# Patient Record
Sex: Male | Born: 1970 | Race: White | Hispanic: No | Marital: Married | Smoking: Never smoker
Health system: Southern US, Community
[De-identification: ages and names within clinical notes are randomized; demographics above are authoritative.]

## PROBLEM LIST (undated history)

## (undated) DIAGNOSIS — R040 Epistaxis: Secondary | ICD-10-CM

## (undated) DIAGNOSIS — R0600 Dyspnea, unspecified: Secondary | ICD-10-CM

## (undated) DIAGNOSIS — S0990XA Unspecified injury of head, initial encounter: Secondary | ICD-10-CM

## (undated) DIAGNOSIS — K219 Gastro-esophageal reflux disease without esophagitis: Secondary | ICD-10-CM

## (undated) DIAGNOSIS — M25559 Pain in unspecified hip: Secondary | ICD-10-CM

## (undated) DIAGNOSIS — K921 Melena: Secondary | ICD-10-CM

## (undated) DIAGNOSIS — M439 Deforming dorsopathy, unspecified: Secondary | ICD-10-CM

## (undated) DIAGNOSIS — M25579 Pain in unspecified ankle and joints of unspecified foot: Secondary | ICD-10-CM

## (undated) DIAGNOSIS — G43909 Migraine, unspecified, not intractable, without status migrainosus: Secondary | ICD-10-CM

## (undated) DIAGNOSIS — R079 Chest pain, unspecified: Secondary | ICD-10-CM

## (undated) DIAGNOSIS — F419 Anxiety disorder, unspecified: Secondary | ICD-10-CM

## (undated) DIAGNOSIS — K209 Esophagitis, unspecified without bleeding: Secondary | ICD-10-CM

## (undated) DIAGNOSIS — I10 Essential (primary) hypertension: Secondary | ICD-10-CM

## (undated) DIAGNOSIS — N200 Calculus of kidney: Secondary | ICD-10-CM

## (undated) DIAGNOSIS — R55 Syncope and collapse: Secondary | ICD-10-CM

## (undated) DIAGNOSIS — K649 Unspecified hemorrhoids: Secondary | ICD-10-CM

## (undated) DIAGNOSIS — G8929 Other chronic pain: Secondary | ICD-10-CM

## (undated) DIAGNOSIS — R413 Other amnesia: Secondary | ICD-10-CM

## (undated) HISTORY — DX: Unspecified injury of head, initial encounter: S09.90XA

## (undated) HISTORY — DX: Melena: K92.1

## (undated) HISTORY — DX: Migraine, unspecified, not intractable, without status migrainosus: G43.909

## (undated) HISTORY — PX: KNEE SURGERY: SHX244

## (undated) HISTORY — DX: Unspecified hemorrhoids: K64.9

## (undated) HISTORY — DX: Essential (primary) hypertension: I10

## (undated) HISTORY — DX: Calculus of kidney: N20.0

## (undated) HISTORY — DX: Other amnesia: R41.3

## (undated) HISTORY — DX: Gastro-esophageal reflux disease without esophagitis: K21.9

## (undated) HISTORY — DX: Chest pain, unspecified: R07.9

## (undated) HISTORY — DX: Epistaxis: R04.0

## (undated) HISTORY — PX: CARDIAC CATHETERIZATION: SHX172

---

## 1997-03-01 HISTORY — PX: OTHER SURGICAL HISTORY: SHX169

## 2015-10-24 DIAGNOSIS — R079 Chest pain, unspecified: Secondary | ICD-10-CM | POA: Insufficient documentation

## 2015-10-25 DIAGNOSIS — I10 Essential (primary) hypertension: Secondary | ICD-10-CM | POA: Insufficient documentation

## 2015-10-25 DIAGNOSIS — D72829 Elevated white blood cell count, unspecified: Secondary | ICD-10-CM | POA: Insufficient documentation

## 2015-10-25 DIAGNOSIS — Z6841 Body Mass Index (BMI) 40.0 and over, adult: Secondary | ICD-10-CM

## 2016-04-29 DIAGNOSIS — S0990XA Unspecified injury of head, initial encounter: Secondary | ICD-10-CM

## 2016-04-29 HISTORY — DX: Unspecified injury of head, initial encounter: S09.90XA

## 2016-06-10 DIAGNOSIS — Z7185 Encounter for immunization safety counseling: Secondary | ICD-10-CM | POA: Insufficient documentation

## 2016-06-10 DIAGNOSIS — Z7189 Other specified counseling: Secondary | ICD-10-CM | POA: Insufficient documentation

## 2016-06-25 ENCOUNTER — Inpatient Hospital Stay: Payer: BLUE CROSS/BLUE SHIELD | Attending: Oncology | Admitting: Oncology

## 2016-06-25 ENCOUNTER — Encounter (INDEPENDENT_AMBULATORY_CARE_PROVIDER_SITE_OTHER): Payer: Self-pay

## 2016-06-25 ENCOUNTER — Inpatient Hospital Stay: Payer: BLUE CROSS/BLUE SHIELD

## 2016-06-25 ENCOUNTER — Encounter: Payer: Self-pay | Admitting: Oncology

## 2016-06-25 VITALS — BP 159/91 | HR 65 | Temp 96.5°F | Resp 18 | Ht 70.47 in | Wt 273.0 lb

## 2016-06-25 DIAGNOSIS — D72821 Monocytosis (symptomatic): Secondary | ICD-10-CM | POA: Insufficient documentation

## 2016-06-25 DIAGNOSIS — Z6841 Body Mass Index (BMI) 40.0 and over, adult: Secondary | ICD-10-CM | POA: Diagnosis not present

## 2016-06-25 DIAGNOSIS — Z79899 Other long term (current) drug therapy: Secondary | ICD-10-CM | POA: Insufficient documentation

## 2016-06-25 DIAGNOSIS — I10 Essential (primary) hypertension: Secondary | ICD-10-CM | POA: Insufficient documentation

## 2016-06-25 DIAGNOSIS — D72829 Elevated white blood cell count, unspecified: Secondary | ICD-10-CM

## 2016-06-25 DIAGNOSIS — D729 Disorder of white blood cells, unspecified: Secondary | ICD-10-CM

## 2016-06-25 DIAGNOSIS — K921 Melena: Secondary | ICD-10-CM

## 2016-06-25 DIAGNOSIS — K625 Hemorrhage of anus and rectum: Secondary | ICD-10-CM | POA: Insufficient documentation

## 2016-06-25 DIAGNOSIS — D72820 Lymphocytosis (symptomatic): Secondary | ICD-10-CM | POA: Insufficient documentation

## 2016-06-25 DIAGNOSIS — K219 Gastro-esophageal reflux disease without esophagitis: Secondary | ICD-10-CM | POA: Diagnosis not present

## 2016-06-25 LAB — CBC WITH DIFFERENTIAL/PLATELET
Basophils Absolute: 0.1 10*3/uL (ref 0–0.1)
Basophils Relative: 1 %
Eosinophils Absolute: 0.5 10*3/uL (ref 0–0.7)
Eosinophils Relative: 3 %
HEMATOCRIT: 43.7 % (ref 40.0–52.0)
HEMOGLOBIN: 15.2 g/dL (ref 13.0–18.0)
LYMPHS ABS: 4.8 10*3/uL — AB (ref 1.0–3.6)
LYMPHS PCT: 32 %
MCH: 27.9 pg (ref 26.0–34.0)
MCHC: 34.8 g/dL (ref 32.0–36.0)
MCV: 80 fL (ref 80.0–100.0)
MONOS PCT: 5 %
Monocytes Absolute: 0.8 10*3/uL (ref 0.2–1.0)
NEUTROS PCT: 59 %
Neutro Abs: 8.9 10*3/uL — ABNORMAL HIGH (ref 1.4–6.5)
Platelets: 358 10*3/uL (ref 150–440)
RBC: 5.47 MIL/uL (ref 4.40–5.90)
RDW: 14.2 % (ref 11.5–14.5)
WBC: 15.1 10*3/uL — AB (ref 3.8–10.6)

## 2016-06-25 LAB — PATHOLOGIST SMEAR REVIEW

## 2016-06-25 NOTE — Progress Notes (Signed)
Here as new pt eval

## 2016-06-25 NOTE — Progress Notes (Signed)
Hematology/Oncology Consult note [facility] [phone] [phone]  Patient Care Team: Dion Body, MD as PCP - General (Family Medicine)   Name of the patient: Manuel Cain  [id number]  1970/06/26    Reason for referral- leucocytosis   Referring physician- Dr. Netty Starring  Date of visit: 06/25/16   History of presenting illness- Patient is a 46 year old male was then referred to [place] for leukocytosis. Recent CBC from 06/10/2016 showed white count of 15.1, H&H of 14.7/42.4 and a platelet count of 367. Differential on the WBC showed immature granulocytes along with neutrophilia and lymphocytosis. Off note patient was noted to have leukocytosis between 15-18 in August 2017 as well. Differential showed neutrophilia and lymphocytosis as well as monocytosis  Patient reports chest wall pain intermittently that sometimes persists for days. He has seen cardiology and is undergoing testing. Also reports seeing bright red bloos in his stool for about 1-2 weeks now. He has never had egd or colonoscopy. No frequent use of NSAIDS. No family h/o colon cancer  ECOG PS- 0  Pain scale- 4   Review of systems- Review of Systems  Constitutional: Negative for chills, fever, malaise/fatigue and weight loss.  HENT: Negative for congestion, ear discharge and nosebleeds.   Eyes: Negative for blurred vision.  Respiratory: Negative for cough, hemoptysis, sputum production, shortness of breath and wheezing.   Cardiovascular: Positive for chest pain. Negative for palpitations, orthopnea and claudication.  Gastrointestinal: Negative for abdominal pain, blood in stool, constipation, diarrhea, heartburn, melena, nausea and vomiting.  Genitourinary: Negative for dysuria, flank pain, frequency, hematuria and urgency.  Musculoskeletal: Negative for back pain, joint pain and myalgias.  Skin: Negative for rash.  Neurological: Negative for dizziness, tingling, focal  weakness, seizures, weakness and headaches.  Endo/Heme/Allergies: Does not bruise/bleed easily.  Psychiatric/Behavioral: Negative for depression and suicidal ideas. The patient does not have insomnia.     Allergies  Allergen Reactions  . Valium [Diazepam]     Patient Active Problem List   Diagnosis Date Noted  . Hypertension, essential 10/25/2015  . Morbid obesity with BMI of 40.0-44.9, adult ([place]) 10/25/2015  . Leukocytosis 10/25/2015  . Chest pain, unspecified 10/24/2015     Past Medical History:  Diagnosis Date  . Blood in stool    dark pebble like per pt  x4 wks  . Chest pain    on going per pt-x 77mo . Frequent nosebleeds    x 2 mo per pt (march 2018)  . GERD (gastroesophageal reflux disease)   . Head injury 04/2016      . Hemorrhoids   . Hypertension   . Memory changes    x 1 yr-since 2017  . Migraine    since 2013     Past Surgical History:  Procedure Laterality Date  . KNEE SURGERY Left   . vastectomy  1999    Social History   Social History  . Marital status: Married    Spouse name: N/A  . Number of children: N/A  . Years of education: N/A   Occupational History  . Not on file.   Social History Main Topics  . Smoking status: Never Smoker  . Smokeless tobacco: Never Used  . Alcohol use No  . Drug use: No  . Sexual activity: Yes   Other Topics Concern  . Not on file   Social History Narrative  . No narrative on file     Family History  Problem Relation Age of Onset  . Hypertension Mother   .  Breast cancer Mother   . CAD Father   . Heart attack Father   . Bladder Cancer Brother      Current Outpatient Prescriptions:  .  esomeprazole (NEXIUM) 20 MG capsule, Take 20 mg by mouth daily at 12 noon., Disp: , Rfl:  .  losartan (COZAAR) 50 MG tablet, TAKE 1 TABLET BY MOUTH DAILY *FILL AFTER 3/29, Disp: , Rfl: 4 .  Multiple Vitamin (MULTIVITAMIN WITH MINERALS) TABS tablet, Take 1 tablet by mouth daily., Disp: , Rfl:  .  naproxen sodium  (ANAPROX) 220 MG tablet, Take 220 mg by mouth 2 (two) times daily with a meal., Disp: , Rfl:    Physical exam:  Vitals:   06/25/16 1347  BP: (!) 159/91  Pulse: 65  Resp: 18  Temp: (!) 96.5 F (35.8 C)  Weight: 273 lb (123.8 kg)  Height: 5' 10.47" (1.79 m)   Physical Exam  Constitutional: He is oriented to person, place, and time and well-developed, well-nourished, and in no distress.  HENT:  Head: Normocephalic and atraumatic.  Eyes: EOM are normal. Pupils are equal, round, and reactive to light.  Neck: Normal range of motion.  Cardiovascular: Normal rate, regular rhythm and normal heart sounds.   Pulmonary/Chest: Effort normal and breath sounds normal.  Abdominal: Soft. Bowel sounds are normal.  Neurological: He is alert and oriented to person, place, and time.  Skin: Skin is warm and dry.       Assessment and plan- Patient is a 46 y.o. male who was been referred to [place] for leukocytosis  Patient has leukocytosis mainly neutrophilia and lymphocytosis and monocytosis based on review of his differential in the past. He was also found to have some immature granulocytes on his differential. Hence I would like to rule out myeloproliferative disorder and I will start off with a repeat CBC with differential, pathology review of his smear, peripheral flow cytometry, ESR and BCR able testing. If the results of these tests are negative and if there is persistence of immature granulocytes, I will consider doing bone marrow biopsy down the line. I will see the patient back in 2 weeks' time to discuss the results of his blood work and further management. Immature granulocytes can be seen in reactive conditions such as infection or inflammation. No active signs of infection noted  With regards to BRBPR- H/H stable. Will refer to GI after cardiology evaluation is complete   Thank you for this kind referral and the opportunity to participate in the care of this patient   Visit Diagnosis 1.  Leukocytosis, unspecified type   2. Neutrophilia   3. Blood in stool     Dr. Randa Evens, MD, MPH [facility] at [facility] Pager- [phone] 06/25/2016 2:32 PM

## 2016-06-29 ENCOUNTER — Encounter: Payer: Self-pay | Admitting: Gastroenterology

## 2016-06-30 LAB — BCR-ABL1, CML/ALL, PCR, QUANT

## 2016-07-01 LAB — COMP PANEL: LEUKEMIA/LYMPHOMA

## 2016-07-09 ENCOUNTER — Inpatient Hospital Stay: Payer: BLUE CROSS/BLUE SHIELD | Admitting: Oncology

## 2016-07-20 ENCOUNTER — Inpatient Hospital Stay: Payer: BLUE CROSS/BLUE SHIELD | Admitting: Oncology

## 2016-07-27 ENCOUNTER — Inpatient Hospital Stay: Payer: BLUE CROSS/BLUE SHIELD | Admitting: Oncology

## 2016-08-12 IMAGING — US US ABDOMEN LIMITED
1 series · 14 of 25 positions shown · non-contrast
Comparison: None.

CLINICAL DATA: Leukocytosis.  Hepatitis-C.

EXAM:
ULTRASOUND ABDOMEN LIMITED RIGHT UPPER QUADRANT

[Series 1: us abdomen limited · 0.22mm/px · 14 of 44 slices shown]
[im 1/44]
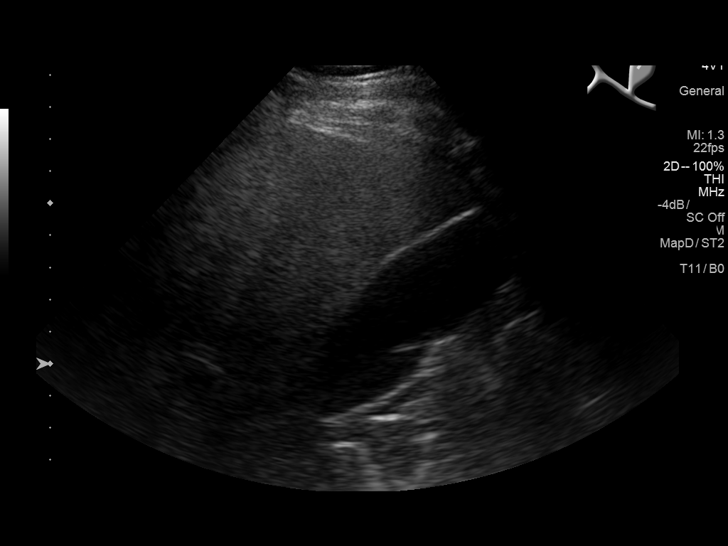
[im 4/44]
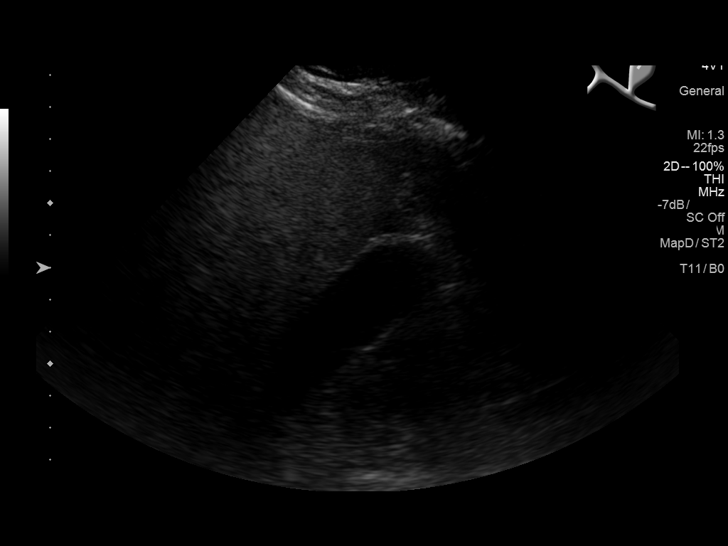
[im 8/44]
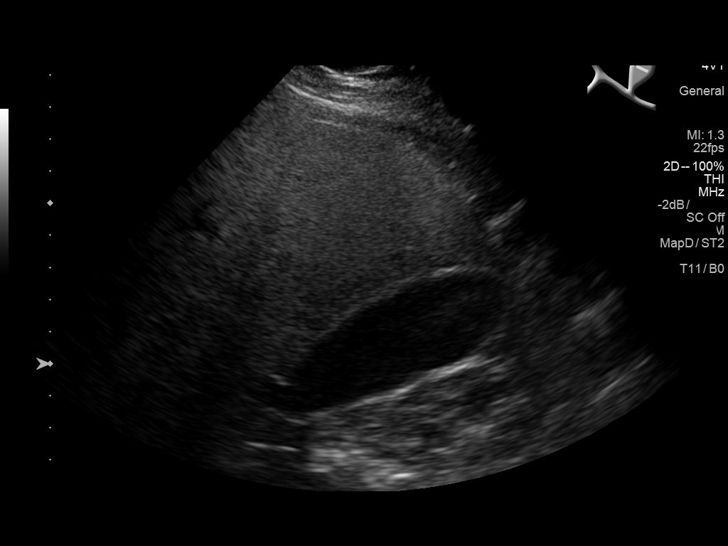
[im 11/44]
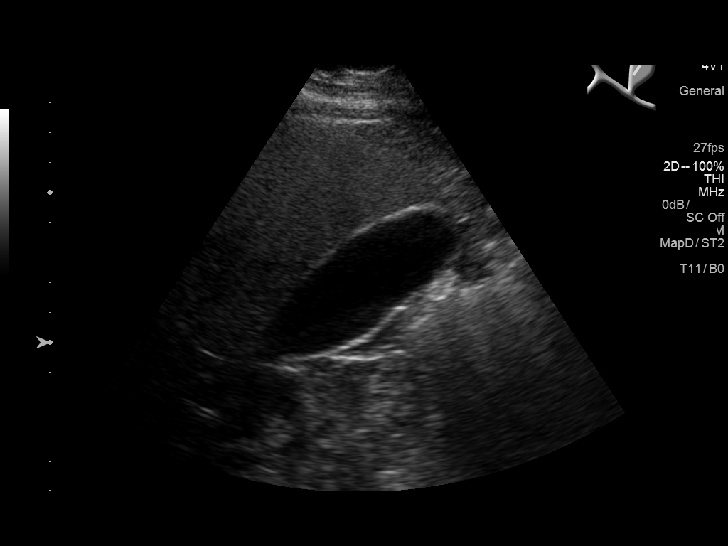
[im 15/44]
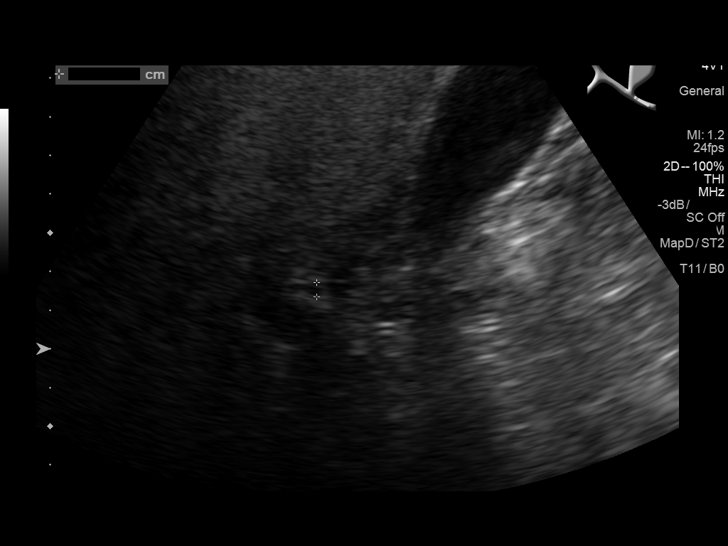
[im 17/44]
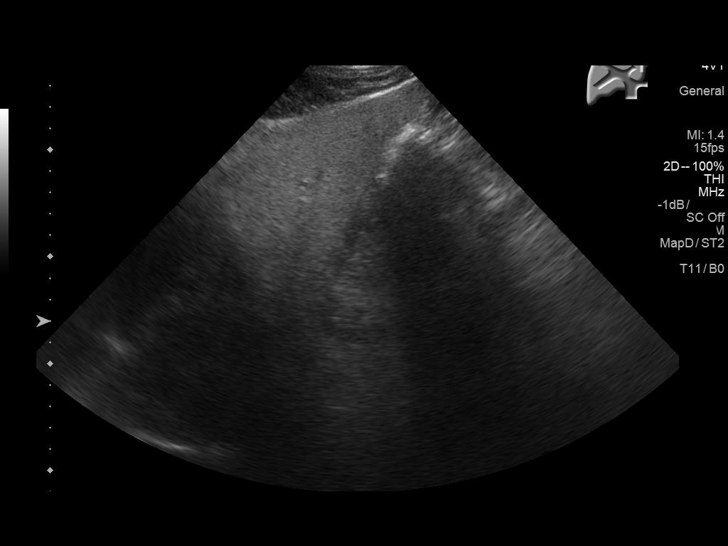
[im 20/44]
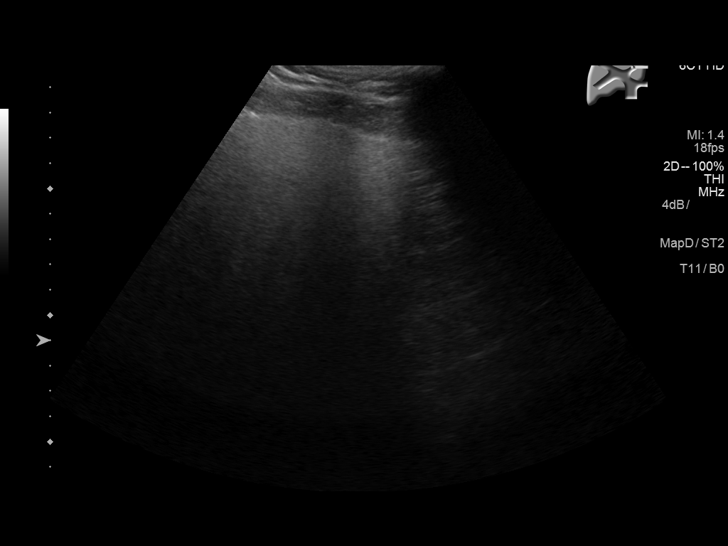
[im 24/44]
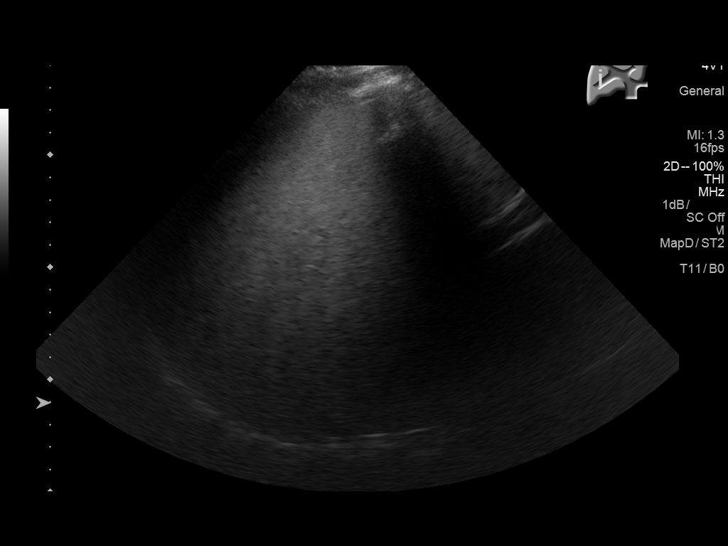
[im 27/44]
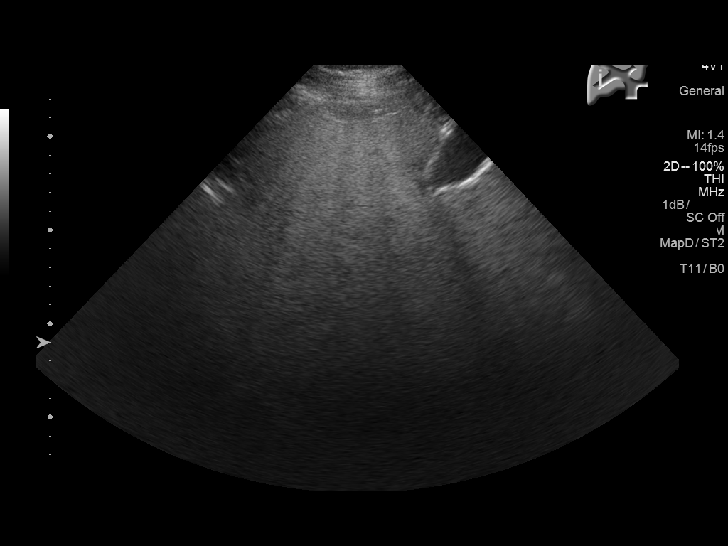
[im 29/44]
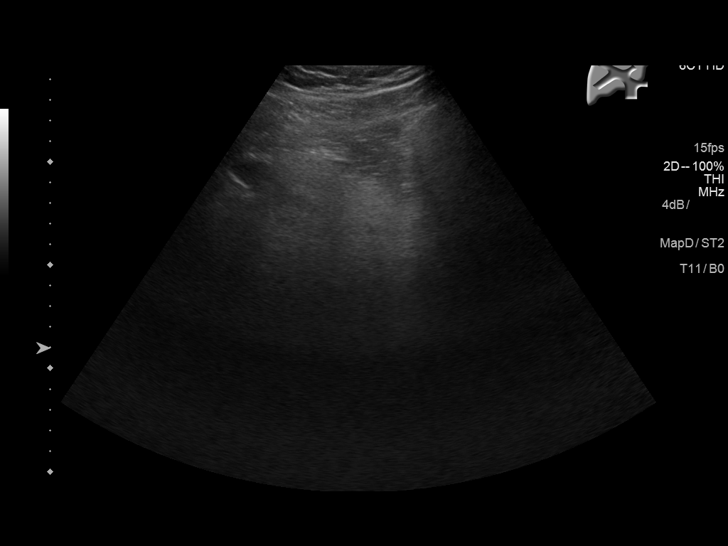
[im 33/44]
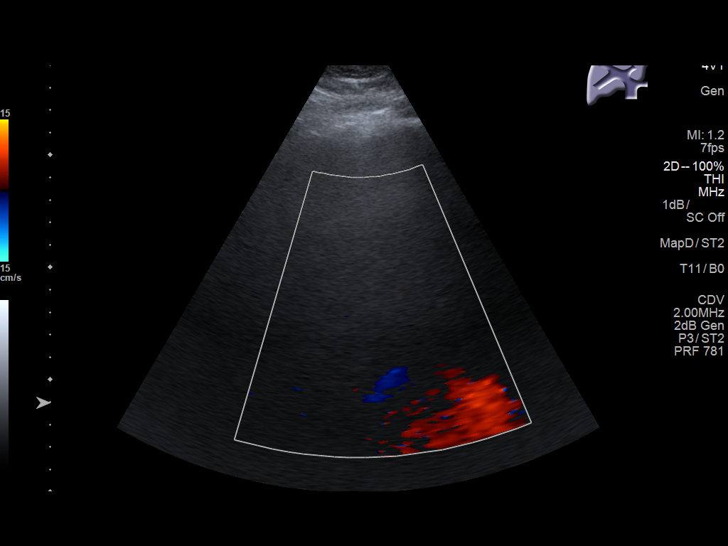
[im 36/44]
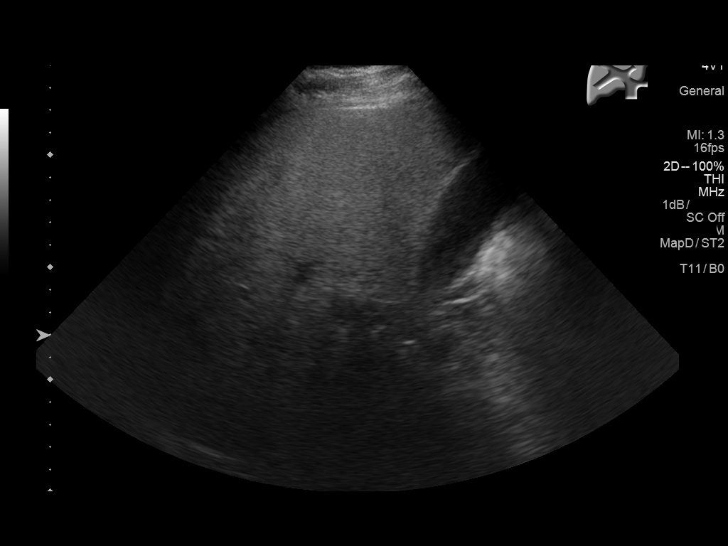
[im 40/44]
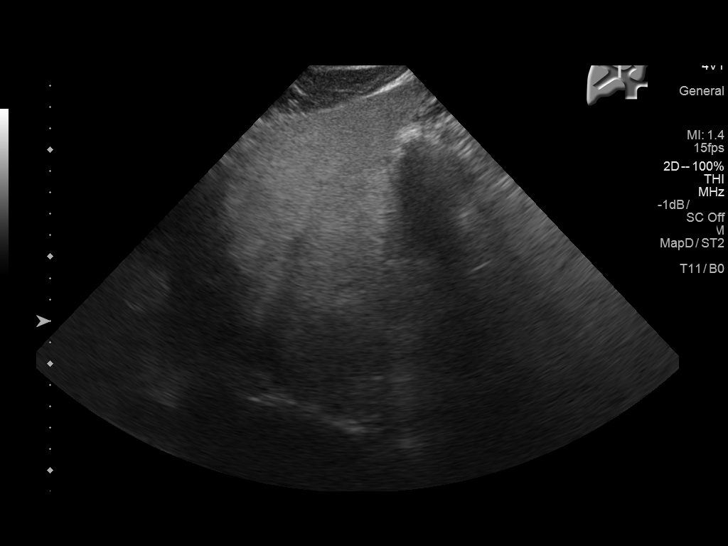
[im 44/44]
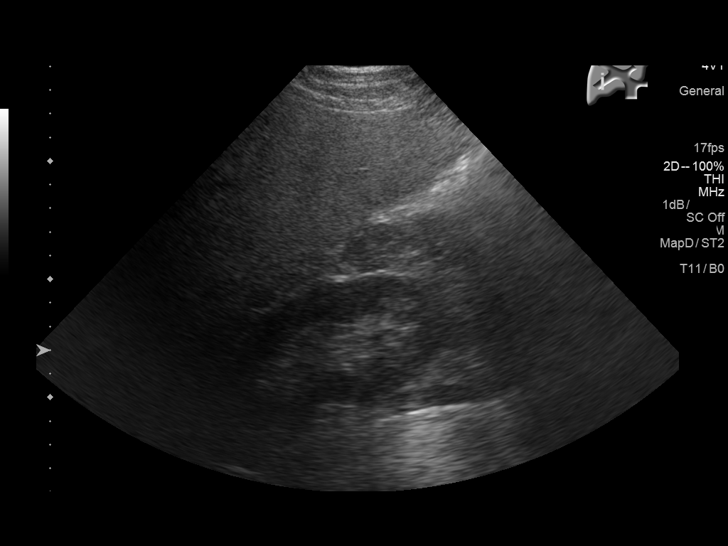

[14 of 25 positions shown; findings below may reference images not displayed]

FINDINGS: Gallbladder:

No gallstones or wall thickening visualized. No sonographic Murphy
sign noted by sonographer.

Common bile duct:

Diameter: 4 mm.

Liver:

Diffusely increased in echogenicity without focal mass. The
parenchyma attenuates the ultrasound beam.
IMPRESSION: Diffuse hepatic steatosis.

Normal gallbladder and biliary tree.

No focal liver mass.

## 2016-08-17 ENCOUNTER — Inpatient Hospital Stay: Payer: BLUE CROSS/BLUE SHIELD | Attending: Oncology | Admitting: Oncology

## 2016-08-17 ENCOUNTER — Encounter: Payer: Self-pay | Admitting: Oncology

## 2016-08-17 VITALS — BP 143/90 | HR 59 | Temp 98.6°F | Ht 70.47 in | Wt 280.0 lb

## 2016-08-17 DIAGNOSIS — Z79899 Other long term (current) drug therapy: Secondary | ICD-10-CM | POA: Insufficient documentation

## 2016-08-17 DIAGNOSIS — I10 Essential (primary) hypertension: Secondary | ICD-10-CM | POA: Insufficient documentation

## 2016-08-17 DIAGNOSIS — K219 Gastro-esophageal reflux disease without esophagitis: Secondary | ICD-10-CM | POA: Diagnosis not present

## 2016-08-17 DIAGNOSIS — K649 Unspecified hemorrhoids: Secondary | ICD-10-CM | POA: Insufficient documentation

## 2016-08-17 DIAGNOSIS — D72829 Elevated white blood cell count, unspecified: Secondary | ICD-10-CM

## 2016-08-17 DIAGNOSIS — K625 Hemorrhage of anus and rectum: Secondary | ICD-10-CM | POA: Diagnosis not present

## 2016-08-17 DIAGNOSIS — R51 Headache: Secondary | ICD-10-CM | POA: Diagnosis not present

## 2016-08-17 DIAGNOSIS — D72820 Lymphocytosis (symptomatic): Secondary | ICD-10-CM | POA: Diagnosis not present

## 2016-08-17 NOTE — Progress Notes (Signed)
Hematology/Oncology Consult note [facility]  [phone] Fax:([phone]  Patient Care Team: Dion Body, MD as PCP - General (Family Medicine)   Name of the patient: Manuel Cain  [id number]  February 13, 1971   Date of visit: 08/17/16  Diagnosis- leucocytosis likely reactive  Chief complaint/ Reason for visit- routine f/u  Heme/Onc history: Patient is a 46 year old male was then referred to [place] for leukocytosis. Recent CBC from 06/10/2016 showed white count of 15.1, H&H of 14.7/42.4 and a platelet count of 367. Differential on the WBC showed immature granulocytes along with neutrophilia and lymphocytosis. Off note patient was noted to have leukocytosis between 15-18 in August 2017 as well. Differential showed neutrophilia and lymphocytosis as well as monocytosis  Patient reports chest wall pain intermittently that sometimes persists for days. He has seen cardiology and is undergoing testing. Also reports seeing bright red bloos in his stool for about 1-2 weeks now. He has never had egd or colonoscopy. No frequent use of NSAIDS. No family h/o colon cancer  Blood work from 06/25/2016 was as follows: CBC showed white count of 15.1, H&H of 15.2/43.7 and a platelet count of 358. Differential on the CBC mainly showed neutrophilia and lymphocytosis. Review of peripheral smear showed leukocytosis with absolute neutrophilia and lymphocytosis. Normal maturation appreciated. No morphologic abnormalities noted. Peripheral flow cytometry showed increasing multiple lymphocyte subsets suggesting a reactive process. No monoclonal B-cell population identified. Testing for BCR able was negative  Interval history- 1. Seen by cardiology for chest pain. Could not get stress test done because he cannot tolerate any drugs IV.   2. Reports shock like sensation that permeates his scalp and lasts for a few sec. He has trouble focusing during these times  3. Problems  with heartburn and BRBPR for which he is seeing Dr. Allen Norris from GI  ECOG PS- 1 Pain scale- 0   Review of systems- Review of Systems  Constitutional: Positive for malaise/fatigue. Negative for chills, fever and weight loss.  HENT: Negative for congestion, ear discharge and nosebleeds.   Eyes: Negative for blurred vision.  Respiratory: Negative for cough, hemoptysis, sputum production, shortness of breath and wheezing.   Cardiovascular: Negative for chest pain, palpitations, orthopnea and claudication.  Gastrointestinal: Positive for blood in stool and heartburn. Negative for abdominal pain, constipation, diarrhea, melena, nausea and vomiting.  Genitourinary: Negative for dysuria, flank pain, frequency, hematuria and urgency.  Musculoskeletal: Negative for back pain, joint pain and myalgias.  Skin: Negative for rash.  Neurological: Positive for headaches. Negative for dizziness, tingling, focal weakness, seizures and weakness.  Endo/Heme/Allergies: Does not bruise/bleed easily.  Psychiatric/Behavioral: Negative for depression and suicidal ideas. The patient does not have insomnia.        Allergies  Allergen Reactions  . Valium [Diazepam]      Past Medical History:  Diagnosis Date  . Blood in stool    dark pebble like per pt  x4 wks  . Chest pain    on going per pt-x 41mo . Frequent nosebleeds    x 2 mo per pt (march 2018)  . GERD (gastroesophageal reflux disease)   . Head injury 04/2016      . Hemorrhoids   . Hypertension   . Memory changes    x 1 yr-since 2017  . Migraine    since 2013     Past Surgical History:  Procedure Laterality Date  . KNEE SURGERY Left   . vastectomy  1999    Social History  Social History  . Marital status: Married    Spouse name: N/A  . Number of children: N/A  . Years of education: N/A   Occupational History  . Not on file.   Social History Main Topics  . Smoking status: Never Smoker  . Smokeless tobacco: Never Used  .  Alcohol use No  . Drug use: No  . Sexual activity: Yes   Other Topics Concern  . Not on file   Social History Narrative  . No narrative on file    Family History  Problem Relation Age of Onset  . Hypertension Mother   . Breast cancer Mother   . CAD Father   . Heart attack Father   . Bladder Cancer Brother      Current Outpatient Prescriptions:  .  esomeprazole (NEXIUM) 20 MG capsule, Take 20 mg by mouth daily at 12 noon., Disp: , Rfl:  .  losartan (COZAAR) 50 MG tablet, TAKE 1 TABLET BY MOUTH DAILY *FILL AFTER 3/29, Disp: , Rfl: 4 .  Multiple Vitamin (MULTIVITAMIN WITH MINERALS) TABS tablet, Take 1 tablet by mouth daily., Disp: , Rfl:  .  naproxen sodium (ANAPROX) 220 MG tablet, Take 220 mg by mouth 2 (two) times daily with a meal., Disp: , Rfl:   Physical exam:  Vitals:   08/17/16 1118  BP: (!) 143/90  Pulse: (!) 59  Temp: 98.6 F (37 C)  TempSrc: Tympanic  Weight: 280 lb (127 kg)  Height: 5' 10.47" (1.79 m)   Physical Exam  Constitutional: He is oriented to person, place, and time.  Obese. In ano acute distress  HENT:  Head: Normocephalic and atraumatic.  Eyes: EOM are normal. Pupils are equal, round, and reactive to light.  Neck: Normal range of motion.  Cardiovascular: Normal rate, regular rhythm and normal heart sounds.   Pulmonary/Chest: Effort normal and breath sounds normal.  Abdominal: Soft. Bowel sounds are normal.  Neurological: He is alert and oriented to person, place, and time.  Skin: Skin is warm and dry.     No flowsheet data found. CBC Latest Ref Rng & Units 06/25/2016  WBC 3.8 - 10.6 K/uL 15.1(H)  Hemoglobin 13.0 - 18.0 g/dL 15.2  Hematocrit 40.0 - 52.0 % 43.7  Platelets 150 - 440 K/uL 358      Assessment and plan- Patient is a 46 y.o. male refererd for leucocytosis mainly neutrophilia and lymphocytosis  Results of bloodwork were discussed with the patient over the phone in the past. There is no evidence of immature cells noted. Flow  cytometry suggestive of reactive process. Bcr abl testing negative. I am inclined to monitor thsi conservatively without bone marrow biopsy at this time. If WBC keeps trending up, it could be considered down the line. No concomitant cytopenias. Repeat cbc with diff in 4 months. I will see him back in 8 months with cbc/diff. His recent cbc 2 weeks ago showed wbc count of 16 again with neutrophilia and lymphocytosis unchanged from prior counts  BRBPR- f/u with GI  Advised to speak to PCP about his headaches and other symptoms as mentioned above   Visit Diagnosis 1. Leukocytosis, unspecified type      Dr. Randa Evens, MD, MPH [facility] at [facility] Pager- [phone] 08/17/2016 1:02 PM

## 2016-08-17 NOTE — Progress Notes (Signed)
Patient c/o bright red to dark blood from rectum after BM. Has an apt with Dr. Allen Norris on Thursday to further discuss his concerns.

## 2016-08-19 ENCOUNTER — Other Ambulatory Visit: Payer: Self-pay

## 2016-08-19 ENCOUNTER — Ambulatory Visit: Payer: BLUE CROSS/BLUE SHIELD | Admitting: Gastroenterology

## 2016-08-19 ENCOUNTER — Ambulatory Visit (INDEPENDENT_AMBULATORY_CARE_PROVIDER_SITE_OTHER): Payer: BLUE CROSS/BLUE SHIELD | Admitting: Gastroenterology

## 2016-08-19 ENCOUNTER — Encounter: Payer: Self-pay | Admitting: Gastroenterology

## 2016-08-19 VITALS — BP 124/69 | HR 85 | Temp 98.8°F | Ht 70.0 in | Wt 278.0 lb

## 2016-08-19 DIAGNOSIS — K219 Gastro-esophageal reflux disease without esophagitis: Secondary | ICD-10-CM | POA: Diagnosis not present

## 2016-08-19 DIAGNOSIS — G8929 Other chronic pain: Secondary | ICD-10-CM

## 2016-08-19 DIAGNOSIS — R197 Diarrhea, unspecified: Secondary | ICD-10-CM

## 2016-08-19 DIAGNOSIS — R1011 Right upper quadrant pain: Secondary | ICD-10-CM

## 2016-08-19 DIAGNOSIS — K921 Melena: Secondary | ICD-10-CM | POA: Diagnosis not present

## 2016-08-19 DIAGNOSIS — D72829 Elevated white blood cell count, unspecified: Secondary | ICD-10-CM

## 2016-08-19 NOTE — Patient Instructions (Signed)
You are scheduled for a RUQ abdominal US at [facility] [place] on Monday, June 25th @ 8:45am. Please arrive at 8:30am. You cannot have anything to eat or drink after midnight Sunday night.  If you need to reschedule this appt for any reason, please contact central scheduling at [phone].

## 2016-08-19 NOTE — Progress Notes (Signed)
Gastroenterology Consultation  Referring Provider:     Sindy Guadeloupe, MD Primary Care Physician:  Dion Body, MD Primary Gastroenterologist:  Dr. Allen Norris     Reason for Consultation:     Rectal bleeding and GERD        HPI:   Manuel Cain is a 46 y.o. y/o male referred for consultation & management of Rectal bleeding and GERD by Dr. Dion Body, MD.  This patient comes in today with a history of rectal bleeding.  The patient states that at first he was having bright red blood per rectum and it was not concerning him.  Then he started to have some specks of clots and dark material.  The patient also reports that his rectum burns when he has a bowel movement.  The patient also reports that the burning persists for sometime after bowel movement.  He says that he was given a rectal exam and he was told that he had no hemorrhoids or any sign of abnormalities.  The patient has also had chronic heartburn.  Despite taking Nexium for this he still continues to have acid breakthrough on a weekly basis.  The patient's wife states that he will wake up in the middle night choking on acid.  There is no report of any unexplained weight loss.  He also denies vomiting up any blood or dark material.  The patient has had findings of a elevated white cell count and is being followed by hematology for this.  Past Medical History:  Diagnosis Date  . Blood in stool    dark pebble like per pt  x4 wks  . Chest pain    on going per pt-x 25mo  . Frequent nosebleeds    x 2 mo per pt (march 2018)  . GERD (gastroesophageal reflux disease)   . Head injury 04/2016      . Hemorrhoids   . Hypertension   . Memory changes    x 1 yr-since 2017  . Migraine    since 2013    Past Surgical History:  Procedure Laterality Date  . KNEE SURGERY Left   . vastectomy  1999    Prior to Admission medications   Medication Sig Start Date End Date Taking? Authorizing Provider  esomeprazole (NEXIUM) 20 MG  capsule Take 20 mg by mouth daily at 12 noon.   Yes [provider]  liver oil-zinc oxide (DESITIN) 40 % ointment Apply 1 application topically as needed for irritation.   Yes [provider]  losartan (COZAAR) 50 MG tablet TAKE 1 TABLET BY MOUTH DAILY *FILL AFTER 3/29 06/05/16  Yes [provider]  Multiple Vitamin (MULTIVITAMIN WITH MINERALS) TABS tablet Take 1 tablet by mouth daily.   Yes [provider]  naproxen sodium (ANAPROX) 220 MG tablet Take 220 mg by mouth 2 (two) times daily with a meal.   Yes [provider]    Family History  Problem Relation Age of Onset  . Hypertension Mother   . Breast cancer Mother   . CAD Father   . Heart attack Father   . Bladder Cancer Brother      Social History  Substance Use Topics  . Smoking status: Never Smoker  . Smokeless tobacco: Never Used  . Alcohol use No    Allergies as of 08/19/2016 - Review Complete 08/19/2016  Allergen Reaction Noted  . Valium [diazepam]  06/25/2016    Review of Systems:    All systems reviewed and negative except  where noted in HPI.   Physical Exam:  BP 124/69   Pulse 85   Temp 98.8 F (37.1 C) (Oral)   Ht 5\' 10"  (1.778 m)   Wt 278 lb (126.1 kg)   BMI 39.89 kg/m  No LMP for male patient. Psych:  Alert and cooperative. Normal mood and affect. General:   Alert,  Well-developed, well-nourished, pleasant and cooperative in NAD Head:  Normocephalic and atraumatic. Eyes:  Sclera clear, no icterus.   Conjunctiva pink. Ears:  Normal auditory acuity. Nose:  No deformity, discharge, or lesions. Mouth:  No deformity or lesions,oropharynx pink & moist. Neck:  Supple; no masses or thyromegaly. Lungs:  Respirations even and unlabored.  Clear throughout to auscultation.   No wheezes, crackles, or rhonchi. No acute distress. Heart:  Regular rate and rhythm; no murmurs, clicks, rubs, or gallops. Abdomen:  Normal bowel sounds.  No bruits.  Soft, Tenderness in the right  upper quadrant over the gallbladder and non-distended without masses, hepatosplenomegaly or hernias noted.  No guarding or rebound tenderness.  Negative Carnett sign.   Rectal:  Deferred.  Msk:  Symmetrical without gross deformities.  Good, equal movement & strength bilaterally. Pulses:  Normal pulses noted. Extremities:  No clubbing or edema.  No cyanosis. Neurologic:  Alert and oriented x3;  grossly normal neurologically. Skin:  Intact without significant lesions or rashes.  No jaundice. Lymph Nodes:  No significant cervical adenopathy. Psych:  Alert and cooperative. Normal mood and affect.  Imaging Studies: No results found.  Assessment and Plan:   Manuel Cain is a 46 y.o. y/o male who comes in with a history of rectal bleeding and chronic GERD the patient is having acid breakthrough on Nexium.  The patient will be given samples of Dexilant to see if this helps his symptoms any better.  The patient will also be set up for an EGD and colonoscopy due to his chronic heartburn as well as rectal bleeding.  The patient had significant tenderness over the gallbladder on physical exam and will be set up for a right upper quadrant ultrasound.  The patient has been explained the plan and agrees with it.  Lucilla Lame, MD. [name]   Note: This dictation was prepared with Dragon dictation along with smaller phrase technology. Any transcriptional errors that result from this process are unintentional.

## 2016-08-23 ENCOUNTER — Ambulatory Visit
Admission: RE | Admit: 2016-08-23 | Discharge: 2016-08-23 | Disposition: A | Discharge status: Home / Self Care | Payer: BLUE CROSS/BLUE SHIELD | Source: Ambulatory Visit | Attending: Gastroenterology | Admitting: Gastroenterology

## 2016-08-23 DIAGNOSIS — K760 Fatty (change of) liver, not elsewhere classified: Secondary | ICD-10-CM | POA: Diagnosis not present

## 2016-08-23 DIAGNOSIS — B1920 Unspecified viral hepatitis C without hepatic coma: Secondary | ICD-10-CM | POA: Diagnosis not present

## 2016-08-23 DIAGNOSIS — D72829 Elevated white blood cell count, unspecified: Secondary | ICD-10-CM | POA: Insufficient documentation

## 2016-08-31 ENCOUNTER — Encounter: Payer: Self-pay | Admitting: *Deleted

## 2016-09-03 ENCOUNTER — Encounter: Payer: Self-pay | Admitting: Anesthesiology

## 2016-09-07 ENCOUNTER — Telehealth: Payer: Self-pay | Admitting: Gastroenterology

## 2016-09-07 NOTE — Telephone Encounter (Signed)
LVM and wants to know if we have heard back from the Cardiologist? Please call Manuela Schwartz [phone]. Procedure is 09/09/16.

## 2016-09-07 NOTE — Telephone Encounter (Signed)
Pts wife has been informed that we are still awaiting Dr. Clayborn Bigness to send cardiac clearance and will contact her soon as we get it-but for now he is still on schedule for colonoscopy as scheduled 09/09/16.

## 2016-09-08 ENCOUNTER — Telehealth: Payer: Self-pay | Admitting: Gastroenterology

## 2016-09-08 NOTE — Telephone Encounter (Signed)
Per Ginger, called patients wife to cancel procedure due to no cardiac clearance. Also called [facility]

## 2016-09-09 ENCOUNTER — Ambulatory Visit
Admission: RE | Admit: 2016-09-09 | Payer: BLUE CROSS/BLUE SHIELD | Source: Ambulatory Visit | Admitting: Gastroenterology

## 2016-09-09 ENCOUNTER — Telehealth: Payer: Self-pay

## 2016-09-09 HISTORY — DX: Other chronic pain: G89.29

## 2016-09-09 HISTORY — DX: Deforming dorsopathy, unspecified: M43.9

## 2016-09-09 HISTORY — DX: Dyspnea, unspecified: R06.00

## 2016-09-09 HISTORY — DX: Pain in unspecified hip: M25.559

## 2016-09-09 HISTORY — DX: Pain in unspecified ankle and joints of unspecified foot: M25.579

## 2016-09-09 SURGERY — COLONOSCOPY WITH PROPOFOL
Anesthesia: Choice

## 2016-09-09 NOTE — Telephone Encounter (Signed)
-----   Message from Lucilla Lame, MD sent at 08/31/2016  9:46 PM EDT ----- Let the patient know the ultrasound show fatty liver without any gallbladder problems.

## 2016-09-09 NOTE — Telephone Encounter (Signed)
Pt's wife, Manuela Schwartz notified of [place] results.

## 2016-09-15 ENCOUNTER — Telehealth: Payer: Self-pay | Admitting: Gastroenterology

## 2016-09-15 NOTE — Telephone Encounter (Signed)
Patient needs a rx for Dexilant called into [facility] on [address].  He was given samples

## 2016-09-17 ENCOUNTER — Telehealth: Payer: Self-pay | Admitting: Gastroenterology

## 2016-09-17 NOTE — Telephone Encounter (Signed)
*  STAT* If patient is at the pharmacy, call can be transferred to refill team.   1. Which medications need to be refilled? (please list name of each medication and dose if known) Dexilant 60 mg  2. Which pharmacy/location (including street and city if local pharmacy) is medication to be sent to? [facility]  3. Do they need a 30 day or 90 day supply? 90 day

## 2016-09-21 ENCOUNTER — Other Ambulatory Visit: Payer: Self-pay

## 2016-09-21 ENCOUNTER — Encounter: Payer: Self-pay | Admitting: Gastroenterology

## 2016-09-21 MED ORDER — DEXLANSOPRAZOLE 60 MG PO CPDR: 60.0000 mg | DELAYED_RELEASE_CAPSULE | Freq: Every day | ORAL | 11 refills | Status: DC

## 2016-11-25 ENCOUNTER — Telehealth: Payer: Self-pay | Admitting: Gastroenterology

## 2016-11-25 NOTE — Telephone Encounter (Signed)
Patients wife called and stated they got clearance for EGD/colonoscopy. Please call Manuela Schwartz at [phone] to reschedule.

## 2016-11-26 ENCOUNTER — Other Ambulatory Visit: Payer: Self-pay

## 2016-11-26 DIAGNOSIS — K921 Melena: Secondary | ICD-10-CM

## 2016-11-26 NOTE — Telephone Encounter (Signed)
EGD and Colonoscopy rescheduled with [facility] on 12/06/16. Instructs/rx mailed.

## 2016-11-29 ENCOUNTER — Telehealth: Payer: Self-pay | Admitting: Gastroenterology

## 2016-11-29 NOTE — Telephone Encounter (Signed)
Patient's wife left a voice message that he needs to reschedule his procedure since he will be out of town. Please call [phone]

## 2016-11-29 NOTE — Telephone Encounter (Signed)
Pt has rescheduled colonoscopy to 12/16/16. Left message at [facility] to change. Updated referral.

## 2016-12-09 ENCOUNTER — Encounter: Payer: Self-pay | Admitting: *Deleted

## 2016-12-09 NOTE — Anesthesia Preprocedure Evaluation (Addendum)
Anesthesia Evaluation  Patient identified by MRN, date of birth, ID band Patient awake    Reviewed: Allergy & Precautions, H&P , NPO status , Patient's Chart, lab work & pertinent test results, reviewed documented beta blocker date and time   Airway Mallampati: II  TM Distance: >3 FB Neck ROM: full    Dental no notable dental hx.    Pulmonary shortness of breath,    Pulmonary exam normal breath sounds clear to auscultation       Cardiovascular Exercise Tolerance: Good hypertension, negative cardio ROS   Rhythm:regular Rate:Normal     Neuro/Psych  Headaches, negative psych ROS   GI/Hepatic Neg liver ROS, GERD  ,  Endo/Other  negative endocrine ROS  Renal/GU negative Renal ROS  negative genitourinary   Musculoskeletal   Abdominal   Peds  Hematology negative hematology ROS (+)   Anesthesia Other Findings BMI 41  Reproductive/Obstetrics negative OB ROS                            Anesthesia Physical Anesthesia Plan  ASA: III  Anesthesia Plan: General   Post-op Pain Management:    Induction:   PONV Risk Score and Plan:   Airway Management Planned:   Additional Equipment:   Intra-op Plan:   Post-operative Plan:   Informed Consent: I have reviewed the patients History and Physical, chart, labs and discussed the procedure including the risks, benefits and alternatives for the proposed anesthesia with the patient or authorized representative who has indicated his/her understanding and acceptance.   Dental Advisory Given  Plan Discussed with: CRNA  Anesthesia Plan Comments:         Anesthesia Quick Evaluation   Cardiac Clearance 11/17/16 in Care Everywhere: Plan  1 chest pain unclear etiology possibly atypical recommend conservative medical therapy at this point 2 obesity recommend weight loss exercise portion control 3 hypertension stable recommend metoprolol 25 mg  twice a day discontinue losartan and HCTZ 12.5 4 patient complains of side effects on losartan GI upset and rectal burning and pain 5 headaches continue over-the-counter medication to help with headache symptoms 6 DJD meloxicam as needed 7 GERD continue Nexium therapy for reflux symptoms 8 patient is cleared as a mild risk for GI evaluation including endoscopy colonoscopy  9 Have the patient f/u in 6 months

## 2016-12-14 NOTE — Discharge Instructions (Signed)
General Anesthesia, Adult, Care After °These instructions provide you with information about caring for yourself after your procedure. Your health care provider may also give you more specific instructions. Your treatment has been planned according to current medical practices, but problems sometimes occur. Call your health care provider if you have any problems or questions after your procedure. °What can I expect after the procedure? °After the procedure, it is common to have: °· Vomiting. °· A sore throat. °· Mental slowness. ° °It is common to feel: °· Nauseous. °· Cold or shivery. °· Sleepy. °· Tired. °· Sore or achy, even in parts of your body where you did not have surgery. ° °Follow these instructions at home: °For at least 24 hours after the procedure: °· Do not: °? Participate in activities where you could fall or become injured. °? Drive. °? Use heavy machinery. °? Drink alcohol. °? Take sleeping pills or medicines that cause drowsiness. °? Make important decisions or sign legal documents. °? Take care of children on your own. °· Rest. °Eating and drinking °· If you vomit, drink water, juice, or soup when you can drink without vomiting. °· Drink enough fluid to keep your urine clear or pale yellow. °· Make sure you have little or no nausea before eating solid foods. °· Follow the diet recommended by your health care provider. °General instructions °· Have a responsible adult stay with you until you are awake and alert. °· Return to your normal activities as told by your health care provider. Ask your health care provider what activities are safe for you. °· Take over-the-counter and prescription medicines only as told by your health care provider. °· If you smoke, do not smoke without supervision. °· Keep all follow-up visits as told by your health care provider. This is important. °Contact a health care provider if: °· You continue to have nausea or vomiting at home, and medicines are not helpful. °· You  cannot drink fluids or start eating again. °· You cannot urinate after 8-12 hours. °· You develop a skin rash. °· You have fever. °· You have increasing redness at the site of your procedure. °Get help right away if: °· You have difficulty breathing. °· You have chest pain. °· You have unexpected bleeding. °· You feel that you are having a life-threatening or urgent problem. °This information is not intended to replace advice given to you by your health care provider. Make sure you discuss any questions you have with your health care provider. °Document Released: 05/24/2000 Document Revised: 07/21/2015 Document Reviewed: 01/30/2015 °Elsevier Interactive Patient Education © 2018 [employer] ° °

## 2016-12-16 ENCOUNTER — Ambulatory Visit: Payer: BLUE CROSS/BLUE SHIELD | Admitting: Anesthesiology

## 2016-12-16 ENCOUNTER — Ambulatory Visit
Admission: RE | Admit: 2016-12-16 | Discharge: 2016-12-16 | Disposition: A | Discharge status: Home / Self Care | Payer: BLUE CROSS/BLUE SHIELD | Source: Ambulatory Visit | Attending: Gastroenterology | Admitting: Gastroenterology

## 2016-12-16 ENCOUNTER — Encounter
Admission: RE | Disposition: A | Discharge status: Home / Self Care | Payer: Self-pay | Source: Ambulatory Visit | Attending: Gastroenterology

## 2016-12-16 DIAGNOSIS — D123 Benign neoplasm of transverse colon: Secondary | ICD-10-CM

## 2016-12-16 DIAGNOSIS — D128 Benign neoplasm of rectum: Secondary | ICD-10-CM | POA: Insufficient documentation

## 2016-12-16 DIAGNOSIS — K921 Melena: Secondary | ICD-10-CM | POA: Diagnosis not present

## 2016-12-16 DIAGNOSIS — K644 Residual hemorrhoidal skin tags: Secondary | ICD-10-CM | POA: Diagnosis not present

## 2016-12-16 DIAGNOSIS — K641 Second degree hemorrhoids: Secondary | ICD-10-CM | POA: Diagnosis not present

## 2016-12-16 DIAGNOSIS — K219 Gastro-esophageal reflux disease without esophagitis: Secondary | ICD-10-CM | POA: Diagnosis not present

## 2016-12-16 DIAGNOSIS — I10 Essential (primary) hypertension: Secondary | ICD-10-CM | POA: Diagnosis not present

## 2016-12-16 DIAGNOSIS — Z79899 Other long term (current) drug therapy: Secondary | ICD-10-CM | POA: Insufficient documentation

## 2016-12-16 DIAGNOSIS — K621 Rectal polyp: Secondary | ICD-10-CM

## 2016-12-16 HISTORY — PX: POLYPECTOMY: SHX5525

## 2016-12-16 HISTORY — PX: ESOPHAGOGASTRODUODENOSCOPY (EGD) WITH PROPOFOL: SHX5813

## 2016-12-16 HISTORY — PX: COLONOSCOPY WITH PROPOFOL: SHX5780

## 2016-12-16 SURGERY — COLONOSCOPY WITH PROPOFOL
Anesthesia: General | Wound class: Contaminated

## 2016-12-16 MED ORDER — LIDOCAINE HCL (CARDIAC) 20 MG/ML IV SOLN
INTRAVENOUS | Status: DC | PRN
  Administered 2016-12-16: 40 mg via INTRAVENOUS

## 2016-12-16 MED ORDER — SODIUM CHLORIDE 0.9 % IV SOLN: INTRAVENOUS | Status: DC

## 2016-12-16 MED ORDER — STERILE WATER FOR IRRIGATION IR SOLN
Status: DC | PRN
  Administered 2016-12-16: 10:00:00

## 2016-12-16 MED ORDER — PROPOFOL 10 MG/ML IV BOLUS
INTRAVENOUS | Status: DC | PRN
  Administered 2016-12-16 (×8): 30 mg via INTRAVENOUS
  Administered 2016-12-16: 20 mg via INTRAVENOUS
  Administered 2016-12-16 (×2): 30 mg via INTRAVENOUS
  Administered 2016-12-16: 40 mg via INTRAVENOUS
  Administered 2016-12-16: 80 mg via INTRAVENOUS
  Administered 2016-12-16: 20 mg via INTRAVENOUS
  Administered 2016-12-16: 40 mg via INTRAVENOUS
  Administered 2016-12-16: 10 mg via INTRAVENOUS
  Administered 2016-12-16: 30 mg via INTRAVENOUS
  Administered 2016-12-16 (×2): 40 mg via INTRAVENOUS
  Administered 2016-12-16: 20 mg via INTRAVENOUS

## 2016-12-16 MED ORDER — LACTATED RINGERS IV SOLN
10.0000 mL/h | INTRAVENOUS | Status: DC
  Administered 2016-12-16: 10 mL/h via INTRAVENOUS

## 2016-12-16 MED ORDER — GLYCOPYRROLATE 0.2 MG/ML IJ SOLN
INTRAMUSCULAR | Status: DC | PRN
  Administered 2016-12-16: 0.2 mg via INTRAVENOUS

## 2016-12-16 SURGICAL SUPPLY — 37 items
BALLN DILATOR 10-12 8 (BALLOONS)
BALLN DILATOR 12-15 8 (BALLOONS)
BALLN DILATOR 15-18 8 (BALLOONS)
BALLN DILATOR CRE 0-12 8 (BALLOONS)
BALLN DILATOR ESOPH 8 10 CRE (MISCELLANEOUS) IMPLANT
BALLOON DILATOR 12-15 8 (BALLOONS) IMPLANT
BALLOON DILATOR 15-18 8 (BALLOONS) IMPLANT
BALLOON DILATOR CRE 0-12 8 (BALLOONS) IMPLANT
BLOCK BITE 60FR ADLT L/F GRN (MISCELLANEOUS) ×4 IMPLANT
CANISTER SUCT 1200ML W/VALVE (MISCELLANEOUS) ×4 IMPLANT
CLIP HMST 235XBRD CATH ROT (MISCELLANEOUS) IMPLANT
CLIP RESOLUTION 360 11X235 (MISCELLANEOUS)
ELECT REM PT RETURN 9FT ADLT (ELECTROSURGICAL) ×4
ELECTRODE REM PT RTRN 9FT ADLT (ELECTROSURGICAL) ×2 IMPLANT
FCP ESCP3.2XJMB 240X2.8X (MISCELLANEOUS)
FORCEPS BIOP RAD 4 LRG CAP 4 (CUTTING FORCEPS) IMPLANT
FORCEPS BIOP RJ4 240 W/NDL (MISCELLANEOUS)
FORCEPS ESCP3.2XJMB 240X2.8X (MISCELLANEOUS) IMPLANT
GOWN CVR UNV OPN BCK APRN NK (MISCELLANEOUS) ×4 IMPLANT
GOWN ISOL THUMB LOOP REG UNIV (MISCELLANEOUS) ×4
INJECTOR VARIJECT VIN23 (MISCELLANEOUS) IMPLANT
KIT DEFENDO VALVE AND CONN (KITS) IMPLANT
KIT ENDO PROCEDURE OLY (KITS) ×4 IMPLANT
MARKER SPOT ENDO TATTOO 5ML (MISCELLANEOUS) IMPLANT
PAD GROUND ADULT SPLIT (MISCELLANEOUS) ×4 IMPLANT
PROBE APC STR FIRE (PROBE) IMPLANT
RETRIEVER NET PLAT FOOD (MISCELLANEOUS) IMPLANT
RETRIEVER NET ROTH 2.5X230 LF (MISCELLANEOUS) IMPLANT
SNARE SHORT THROW 13M SML OVAL (MISCELLANEOUS) ×4 IMPLANT
SNARE SHORT THROW 30M LRG OVAL (MISCELLANEOUS) IMPLANT
SNARE SNG USE RND 15MM (INSTRUMENTS) IMPLANT
SPOT EX ENDOSCOPIC TATTOO (MISCELLANEOUS)
SYR INFLATION 60ML (SYRINGE) IMPLANT
TRAP ETRAP POLY (MISCELLANEOUS) ×4 IMPLANT
VARIJECT INJECTOR VIN23 (MISCELLANEOUS)
WATER STERILE IRR 250ML POUR (IV SOLUTION) ×4 IMPLANT
WIRE CRE 18-20MM 8CM F G (MISCELLANEOUS) IMPLANT

## 2016-12-16 NOTE — Op Note (Signed)
[facility] Gastroenterology Patient Name: Manuel Cain Procedure Date: 12/16/2016 9:11 AM MRN: [id number] Account #: [id number] Date of Birth: 03-May-1970 Admit Type: Outpatient Age: 46 Room: [facility] OR ROOM 01 Gender: Male Note Status: Finalized Procedure:            Colonoscopy Indications:          Hematochezia Providers:            Lucilla Lame MD, MD Medicines:            Propofol per Anesthesia Complications:        No immediate complications. Procedure:            Pre-Anesthesia Assessment:                       - Prior to the procedure, a History and Physical was                        performed, and patient medications and allergies were                        reviewed. The patient's tolerance of previous                        anesthesia was also reviewed. The risks and benefits of                        the procedure and the sedation options and risks were                        discussed with the patient. All questions were                        answered, and informed consent was obtained. Prior                        Anticoagulants: The patient has taken no previous                        anticoagulant or antiplatelet agents. ASA Grade                        Assessment: II - A patient with mild systemic disease.                        After reviewing the risks and benefits, the patient was                        deemed in satisfactory condition to undergo the                        procedure.                       After obtaining informed consent, the colonoscope was                        passed under direct vision. Throughout the procedure,                        the patient's blood pressure, pulse, and oxygen  saturations were monitored continuously. The Olympus                        CF-HQ190L Colonoscope (S#. [id number]) was introduced                        through the anus and advanced to the the cecum,      identified by appendiceal orifice and ileocecal valve.                        The colonoscopy was performed without difficulty. The                        patient tolerated the procedure well. The quality of                        the bowel preparation was excellent. Findings:      The perianal and digital rectal examinations were normal.      Non-bleeding external internal hemorrhoids were found during       retroflexion. The hemorrhoids were Grade II (internal hemorrhoids that       prolapse but reduce spontaneously).      Two sessile polyps were found in the transverse colon. The polyps were 4       to 8 mm in size. These polyps were removed with a cold snare. Resection       and retrieval were complete.      A 8 mm polyp was found in the rectum. The polyp was pedunculated. The       polyp was removed with a hot snare. Resection and retrieval were       complete. Impression:           - Non-bleeding external internal hemorrhoids.                       - Two 4 to 8 mm polyps in the transverse colon, removed                        with a cold snare. Resected and retrieved.                       - One 8 mm polyp in the rectum, removed with a hot                        snare. Resected and retrieved. Recommendation:       - Discharge patient to home.                       - Resume previous diet.                       - Continue present medications.                       - Await pathology results.                       - Repeat colonoscopy in 5 years if polyp adenoma and 10                        years if hyperplastic  Procedure Code(s):    --- Professional ---                       [phone], Colonoscopy, flexible; with removal of tumor(s),                        polyp(s), or other lesion(s) by snare technique Diagnosis Code(s):    --- Professional ---                       K92.1, Melena (includes Hematochezia)                       D12.3, Benign neoplasm of transverse colon (hepatic                         flexure or splenic flexure)                       K62.1, Rectal polyp CPT copyright 2016 American Medical Association. All rights reserved. The codes documented in this report are preliminary and upon coder review may  be revised to meet current compliance requirements. Lucilla Lame MD, MD 12/16/2016 9:43:00 AM This report has been signed electronically. Number of Addenda: 0 Note Initiated On: 12/16/2016 9:11 AM Scope Withdrawal Time: 0 hours 10 minutes 26 seconds  Total Procedure Duration: 0 hours 12 minutes 58 seconds       [facility]

## 2016-12-16 NOTE — H&P (Signed)
Lucilla Lame, MD [facility] [address] [place], [place] [address] Phone:[phone] Fax : [phone]  Primary Care Physician:  Dion Body, MD Primary Gastroenterologist:  Dr. Allen Norris  Pre-Procedure History & Physical: HPI:  Manuel Cain is a 46 y.o. male is here for an endoscopy and colonoscopy.   Past Medical History:  Diagnosis Date  . Blood in stool    dark pebble like per pt  x4 wks  . Chest pain    on going per pt-x 61mo  . Chronic ankle pain    Right, s/p HER(7408)  . Chronic hip pain    right, s/p XKG(8185)  . Curvature of spine   . Dyspnea   . Frequent nosebleeds    x 2 mo per pt (march 2018)  . GERD (gastroesophageal reflux disease)   . Head injury 04/2016      . Hemorrhoids   . Hypertension   . Memory changes    x 1 yr-since 2017  . Migraine    since 2013    Past Surgical History:  Procedure Laterality Date  . KNEE SURGERY Left   . vastectomy  1999    Prior to Admission medications   Medication Sig Start Date End Date Taking? Authorizing Provider  esomeprazole (NEXIUM) 20 MG capsule Take 20 mg by mouth daily at 12 noon.   Yes [provider]  hydrochlorothiazide (HYDRODIURIL) 12.5 MG tablet Take 12.5 mg by mouth daily.   Yes [provider]  liver oil-zinc oxide (DESITIN) 40 % ointment Apply 1 application topically as needed for irritation.   Yes [provider]  metoprolol tartrate (LOPRESSOR) 25 MG tablet Take 25 mg by mouth 2 (two) times daily.   Yes [provider]  Multiple Vitamins-Minerals (EMERGEN-C VITAMIN C PO) Take by mouth daily.   Yes [provider]  dexlansoprazole (DEXILANT) 60 MG capsule Take 1 capsule (60 mg total) by mouth daily. Patient not taking: Reported on 12/09/2016 09/21/16   Lucilla Lame, MD  losartan (COZAAR) 50 MG tablet TAKE 1 TABLET BY MOUTH DAILY *FILL AFTER 3/29 06/05/16   [provider]    Allergies as of 11/26/2016 - Review Complete 08/31/2016    Allergen Reaction Noted  . Valium [diazepam]  06/25/2016    Family History  Problem Relation Age of Onset  . Hypertension Mother   . Breast cancer Mother   . CAD Father   . Heart attack Father   . Bladder Cancer Brother     Social History   Social History  . Marital status: Married    Spouse name: N/A  . Number of children: N/A  . Years of education: N/A   Occupational History  . Not on file.   Social History Main Topics  . Smoking status: Never Smoker  . Smokeless tobacco: Never Used  . Alcohol use No  . Drug use: No  . Sexual activity: Yes   Other Topics Concern  . Not on file   Social History Narrative  . No narrative on file    Review of Systems: See HPI, otherwise negative ROS  Physical Exam: BP (!) 153/87   Pulse 66   Temp (!) 97.3 F (36.3 C) (Temporal)   Resp 16   Ht 5\' 9"  (1.753 m)   Wt 275 lb (124.7 kg)   SpO2 100%   BMI 40.61 kg/m  General:   Alert,  pleasant and cooperative in NAD Head:  Normocephalic and atraumatic. Neck:  Supple; no masses or thyromegaly.  Lungs:  Clear throughout to auscultation.    Heart:  Regular rate and rhythm. Abdomen:  Soft, nontender and nondistended. Normal bowel sounds, without guarding, and without rebound.   Neurologic:  Alert and  oriented x4;  grossly normal neurologically.  Impression/Plan: Manuel Cain is here for an endoscopy and colonoscopy to be performed for GERD and hematochezia  Risks, benefits, limitations, and alternatives regarding  endoscopy and colonoscopy have been reviewed with the patient.  Questions have been answered.  All parties agreeable.   Lucilla Lame, MD  12/16/2016, 9:04 AM

## 2016-12-16 NOTE — Anesthesia Postprocedure Evaluation (Signed)
Anesthesia Post Note  Patient: Manuel Cain  Procedure(s) Performed: COLONOSCOPY WITH PROPOFOL (N/A ) ESOPHAGOGASTRODUODENOSCOPY (EGD) WITH PROPOFOL (N/A ) POLYPECTOMY  Patient location during evaluation: PACU Anesthesia Type: General Level of consciousness: awake and alert Pain management: pain level controlled Vital Signs Assessment: post-procedure vital signs reviewed and stable Respiratory status: spontaneous breathing, nonlabored ventilation, respiratory function stable and patient connected to nasal cannula oxygen Cardiovascular status: blood pressure returned to baseline and stable Postop Assessment: no apparent nausea or vomiting Anesthetic complications: no    Braedyn Kauk ELAINE

## 2016-12-16 NOTE — Anesthesia Procedure Notes (Signed)
Performed by: Kiela Shisler Pre-anesthesia Checklist: Patient identified, Emergency Drugs available, Suction available, Timeout performed and Patient being monitored Patient Re-evaluated:Patient Re-evaluated prior to induction Oxygen Delivery Method: Nasal cannula Placement Confirmation: positive ETCO2       

## 2016-12-16 NOTE — Transfer of Care (Signed)
Immediate Anesthesia Transfer of Care Note  Patient: Manuel Cain  Procedure(s) Performed: COLONOSCOPY WITH PROPOFOL (N/A ) ESOPHAGOGASTRODUODENOSCOPY (EGD) WITH PROPOFOL (N/A )  Patient Location: PACU  Anesthesia Type: General  Level of Consciousness: awake, alert  and patient cooperative  Airway and Oxygen Therapy: Patient Spontanous Breathing and Patient connected to supplemental oxygen  Post-op Assessment: Post-op Vital signs reviewed, Patient's Cardiovascular Status Stable, Respiratory Function Stable, Patent Airway and No signs of Nausea or vomiting  Post-op Vital Signs: Reviewed and stable  Complications: No apparent anesthesia complications

## 2016-12-16 NOTE — Op Note (Signed)
[facility] Gastroenterology Patient Name: Manuel Cain Procedure Date: 12/16/2016 9:12 AM MRN: [id number] Account #: [id number] Date of Birth: 12-14-1970 Admit Type: Ambulatory Age: 46 Room: [facility] OR ROOM 01 Gender: Male Note Status: Finalized Procedure:            Upper GI endoscopy Indications:          Heartburn Providers:            Lucilla Lame MD, MD Referring MD:         Dion Body (Referring MD) Medicines:            Propofol per Anesthesia Complications:        No immediate complications. Procedure:            Pre-Anesthesia Assessment:                       - Prior to the procedure, a History and Physical was                        performed, and patient medications and allergies were                        reviewed. The patient's tolerance of previous                        anesthesia was also reviewed. The risks and benefits of                        the procedure and the sedation options and risks were                        discussed with the patient. All questions were                        answered, and informed consent was obtained. Prior                        Anticoagulants: The patient has taken no previous                        anticoagulant or antiplatelet agents. ASA Grade                        Assessment: II - A patient with mild systemic disease.                        After reviewing the risks and benefits, the patient was                        deemed in satisfactory condition to undergo the                        procedure.                       After obtaining informed consent, the endoscope was                        passed under direct vision. Throughout the procedure,  the patient's blood pressure, pulse, and oxygen                        saturations were monitored continuously. The Olympus                        190 Endoscope [id number]) was introduced through the                        mouth,  and advanced to the second part of duodenum. The                        upper GI endoscopy was accomplished without difficulty.                        The patient tolerated the procedure well. Findings:      The examined esophagus was normal.      The stomach was normal.      A healed ulcer was found in the duodenal bulb. Impression:           - Normal esophagus.                       - Normal stomach.                       - Duodenal scar.                       - No specimens collected. Recommendation:       - Discharge patient to home.                       - Resume previous diet.                       - Continue present medications. Procedure Code(s):    --- Professional ---                       [phone], Esophagogastroduodenoscopy, flexible, transoral;                        diagnostic, including collection of specimen(s) by                        brushing or washing, when performed (separate procedure) Diagnosis Code(s):    --- Professional ---                       R12, Heartburn CPT copyright 2016 American Medical Association. All rights reserved. The codes documented in this report are preliminary and upon coder review may  be revised to meet current compliance requirements. Lucilla Lame MD, MD 12/16/2016 9:24:48 AM This report has been signed electronically. Number of Addenda: 0 Note Initiated On: 12/16/2016 9:12 AM      [facility]

## 2016-12-17 ENCOUNTER — Encounter: Payer: Self-pay | Admitting: Gastroenterology

## 2016-12-17 ENCOUNTER — Inpatient Hospital Stay: Payer: BLUE CROSS/BLUE SHIELD | Attending: Oncology

## 2016-12-20 ENCOUNTER — Encounter: Payer: Self-pay | Admitting: Gastroenterology

## 2016-12-21 ENCOUNTER — Encounter: Payer: Self-pay | Admitting: Gastroenterology

## 2016-12-23 ENCOUNTER — Encounter: Payer: Self-pay | Admitting: Gastroenterology

## 2017-01-24 ENCOUNTER — Encounter: Payer: Self-pay | Admitting: Gastroenterology

## 2017-02-09 ENCOUNTER — Inpatient Hospital Stay: Payer: BLUE CROSS/BLUE SHIELD | Attending: Oncology

## 2017-03-17 ENCOUNTER — Ambulatory Visit: Payer: BLUE CROSS/BLUE SHIELD | Admitting: Gastroenterology

## 2017-03-17 ENCOUNTER — Other Ambulatory Visit: Payer: Self-pay

## 2017-03-17 ENCOUNTER — Encounter: Payer: Self-pay | Admitting: Gastroenterology

## 2017-03-17 VITALS — BP 145/72 | HR 66 | Ht 70.0 in | Wt 277.0 lb

## 2017-03-17 DIAGNOSIS — R748 Abnormal levels of other serum enzymes: Secondary | ICD-10-CM

## 2017-03-17 DIAGNOSIS — K2100 Gastro-esophageal reflux disease with esophagitis, without bleeding: Secondary | ICD-10-CM

## 2017-03-17 MED ORDER — PANTOPRAZOLE SODIUM 40 MG PO TBEC: 40.0000 mg | DELAYED_RELEASE_TABLET | Freq: Every day | ORAL | 5 refills | Status: DC

## 2017-03-17 NOTE — Progress Notes (Signed)
Primary Care Physician: Dion Body, MD  Primary Gastroenterologist:  Dr. Lucilla Lame  Chief Complaint  Patient presents with  . Follow up ER  . Elevated Hepatic Enzymes  . Esophageal spasms    HPI: Manuel Cain is a 47 y.o. male here for abnormal liver enzymes and increased lipase. The patient was found to have fatty liver on ultrasound. The patient has had a mildly elevated lipase on one instance and slightly elevated AST and ALT. The patient reports that he has right sided abdominal pain with epigastric pain area the patient states that the pain is reproducible while laying down and pressing lightly on his abdomen. There is no report of any unexplained weight loss fevers chills black stools or bloody stools. The patient also reports that he had some chest pain that radiated to both the right and left side and he thought he was having esophageal spasms. The patient states that he was greatly helps with Dexilant but states that his insurance did not cover it so he has been taking 20 mg of Nexium.  Current Outpatient Medications  Medication Sig Dispense Refill  . esomeprazole (NEXIUM) 20 MG capsule Take 20 mg by mouth daily at 12 noon.    . hydrochlorothiazide (HYDRODIURIL) 12.5 MG tablet Take 12.5 mg by mouth daily.    Marland Kitchen liver oil-zinc oxide (DESITIN) 40 % ointment Apply 1 application topically as needed for irritation.    . metoprolol tartrate (LOPRESSOR) 25 MG tablet Take 25 mg by mouth 2 (two) times daily.    Marland Kitchen dexlansoprazole (DEXILANT) 60 MG capsule Take 1 capsule (60 mg total) by mouth daily. (Patient not taking: Reported on 12/09/2016) 30 capsule 11  . hydrocortisone (ANUSOL-HC) 25 MG suppository Place rectally.    Marland Kitchen losartan (COZAAR) 50 MG tablet TAKE 1 TABLET BY MOUTH DAILY *FILL AFTER 3/29  4  . meloxicam (MOBIC) 15 MG tablet Take by mouth.    . Multiple Vitamins-Minerals (EMERGEN-C VITAMIN C PO) Take by mouth daily.    . pantoprazole (PROTONIX) 40 MG tablet Take  1 tablet (40 mg total) by mouth daily. 30 tablet 5  . predniSONE (DELTASONE) 20 MG tablet Take 40 mg by mouth daily.  0   No current facility-administered medications for this visit.     Allergies as of 03/17/2017 - Review Complete 12/16/2016  Allergen Reaction Noted  . Valium [diazepam]  06/25/2016    ROS:  General: Negative for anorexia, weight loss, fever, chills, fatigue, weakness. ENT: Negative for hoarseness, difficulty swallowing , nasal congestion. CV: Negative for chest pain, angina, palpitations, dyspnea on exertion, peripheral edema.  Respiratory: Negative for dyspnea at rest, dyspnea on exertion, cough, sputum, wheezing.  GI: See history of present illness. GU:  Negative for dysuria, hematuria, urinary incontinence, urinary frequency, nocturnal urination.  Endo: Negative for unusual weight change.    Physical Examination:   BP (!) 145/72   Pulse 66   Ht 5\' 10"  (1.778 m)   Wt 277 lb (125.6 kg)   BMI 39.75 kg/m   General: Well-nourished, well-developed in no acute distress.  Eyes: No icterus. Conjunctivae pink. Mouth: Oropharyngeal mucosa moist and pink , no lesions erythema or exudate. Lungs: Clear to auscultation bilaterally. Non-labored. Heart: Regular rate and rhythm, no murmurs rubs or gallops.  Abdomen: Bowel sounds are normal, positive tenderness to 1 finger palpation with slightly palpating the abdominal wall in the epigastric area and right upper quadrant. Nondistended, no hepatosplenomegaly or masses, no abdominal bruits or hernia , no rebound  or guarding.   Extremities: No lower extremity edema. No clubbing or deformities. Neuro: Alert and oriented x 3.  Grossly intact. Skin: Warm and dry, no jaundice.   Psych: Alert and cooperative, normal mood and affect.  Labs:    Imaging Studies: No results found.  Assessment and Plan:   Manuel Cain is a 47 y.o. y/o male with what appears to be musculoskeletal pain on the right upper quadrant and  epigastric area which is reproducible by slight touch of that area with 1 finger. The patient has been told to use warm compresses that area and take anti-inflammatory medications to help with the pain. The patient has also been told that due to his increased liver enzymes and fatty liver he will have those enzymes checked again. The patient may be suffering from esophageal spasms which may be result of his chronic reflux which is not being helped by his Nexium 20 mg a day. The patient will be started on Protonix 40 mg in the evening to see if this helps his symptoms. If the patient's liver enzymes are still elevated he will need a further workup for his abnormal liver enzymes. The patient has been told to try and lose weight due to fatty liver. The patient has been explained the plan and agrees with it    Lucilla Lame, MD. [name]   Note: This dictation was prepared with Dragon dictation along with smaller phrase technology. Any transcriptional errors that result from this process are unintentional.

## 2017-03-18 LAB — HEPATIC FUNCTION PANEL
ALBUMIN: 4.9 g/dL (ref 3.5–5.5)
ALK PHOS: 58 IU/L (ref 39–117)
ALT: 48 IU/L — ABNORMAL HIGH (ref 0–44)
AST: 38 IU/L (ref 0–40)
BILIRUBIN TOTAL: 0.6 mg/dL (ref 0.0–1.2)
BILIRUBIN, DIRECT: 0.15 mg/dL (ref 0.00–0.40)
TOTAL PROTEIN: 7.8 g/dL (ref 6.0–8.5)

## 2017-03-18 LAB — LIPASE: Lipase: 100 U/L — ABNORMAL HIGH (ref 13–78)

## 2017-04-05 ENCOUNTER — Encounter: Payer: Self-pay | Admitting: Gastroenterology

## 2017-04-18 ENCOUNTER — Other Ambulatory Visit: Payer: Self-pay | Admitting: Family Medicine

## 2017-04-18 ENCOUNTER — Other Ambulatory Visit: Payer: Self-pay

## 2017-04-18 DIAGNOSIS — F411 Generalized anxiety disorder: Secondary | ICD-10-CM | POA: Insufficient documentation

## 2017-04-18 DIAGNOSIS — K2100 Gastro-esophageal reflux disease with esophagitis, without bleeding: Secondary | ICD-10-CM

## 2017-04-18 DIAGNOSIS — Z87828 Personal history of other (healed) physical injury and trauma: Secondary | ICD-10-CM

## 2017-04-18 DIAGNOSIS — R37 Sexual dysfunction, unspecified: Secondary | ICD-10-CM | POA: Insufficient documentation

## 2017-04-18 DIAGNOSIS — F418 Other specified anxiety disorders: Secondary | ICD-10-CM | POA: Insufficient documentation

## 2017-04-19 ENCOUNTER — Inpatient Hospital Stay: Payer: BLUE CROSS/BLUE SHIELD | Admitting: Oncology

## 2017-04-19 ENCOUNTER — Inpatient Hospital Stay: Payer: BLUE CROSS/BLUE SHIELD | Attending: Oncology

## 2017-04-22 ENCOUNTER — Other Ambulatory Visit: Payer: Self-pay | Admitting: Family Medicine

## 2017-04-22 DIAGNOSIS — Z87828 Personal history of other (healed) physical injury and trauma: Secondary | ICD-10-CM

## 2017-04-23 ENCOUNTER — Ambulatory Visit: Payer: BLUE CROSS/BLUE SHIELD

## 2017-05-02 ENCOUNTER — Ambulatory Visit
Admission: RE | Admit: 2017-05-02 | Payer: BLUE CROSS/BLUE SHIELD | Source: Ambulatory Visit | Admitting: Gastroenterology

## 2017-05-02 ENCOUNTER — Encounter: Admission: RE | Payer: Self-pay | Source: Ambulatory Visit

## 2017-05-02 SURGERY — MANOMETRY, ESOPHAGUS

## 2017-06-09 ENCOUNTER — Telehealth: Payer: Self-pay | Admitting: Gastroenterology

## 2017-06-09 NOTE — Telephone Encounter (Signed)
Patient called and would like to talk to you about scheduling a procedure. Whenever he drinks something there is pain around his heart.  Dr. Clayborn Bigness stated there is nothing wrong with his heart. Please call patient.

## 2017-06-15 NOTE — Telephone Encounter (Signed)
LVM for pt to return my call.

## 2017-07-06 NOTE — Telephone Encounter (Signed)
Left vm again for pt to return my call.  

## 2017-11-03 DIAGNOSIS — G609 Hereditary and idiopathic neuropathy, unspecified: Secondary | ICD-10-CM | POA: Insufficient documentation

## 2017-11-04 DIAGNOSIS — E538 Deficiency of other specified B group vitamins: Secondary | ICD-10-CM | POA: Insufficient documentation

## 2018-03-23 ENCOUNTER — Other Ambulatory Visit: Payer: Self-pay | Admitting: Gastroenterology

## 2018-05-04 ENCOUNTER — Ambulatory Visit: Payer: BLUE CROSS/BLUE SHIELD | Admitting: Adult Health

## 2018-05-04 ENCOUNTER — Encounter: Payer: Self-pay | Admitting: Adult Health

## 2018-05-04 ENCOUNTER — Other Ambulatory Visit: Payer: Self-pay

## 2018-05-04 VITALS — BP 130/97 | HR 61 | Resp 16 | Ht 69.5 in | Wt 266.0 lb

## 2018-05-04 DIAGNOSIS — K219 Gastro-esophageal reflux disease without esophagitis: Secondary | ICD-10-CM

## 2018-05-04 DIAGNOSIS — R42 Dizziness and giddiness: Secondary | ICD-10-CM

## 2018-05-04 DIAGNOSIS — I10 Essential (primary) hypertension: Secondary | ICD-10-CM

## 2018-05-04 DIAGNOSIS — R519 Headache, unspecified: Secondary | ICD-10-CM

## 2018-05-04 DIAGNOSIS — H539 Unspecified visual disturbance: Secondary | ICD-10-CM

## 2018-05-04 DIAGNOSIS — R51 Headache: Secondary | ICD-10-CM

## 2018-05-04 DIAGNOSIS — H5500 Unspecified nystagmus: Secondary | ICD-10-CM | POA: Diagnosis not present

## 2018-05-04 DIAGNOSIS — G8929 Other chronic pain: Secondary | ICD-10-CM

## 2018-05-04 MED ORDER — LOSARTAN POTASSIUM 50 MG PO TABS: 50.0000 mg | ORAL_TABLET | Freq: Every day | ORAL | 1 refills | Status: DC

## 2018-05-04 NOTE — Progress Notes (Signed)
[facility] [place], [place] [address]  Internal MEDICINE  Office Visit Note  Patient Name: Manuel Cain  [id number]  [id number]  Date of Service: 05/14/2018   Complaints/HPI Pt is here for establishment of PCP. Chief Complaint  Patient presents with  . Hypertension  . Gastroesophageal Reflux  . Cough    nagging cough for 10 days   . Medical Management of Chronic Issues    History of chest pain for 2 - 3 years now , been to cardiologist with no diagnosis. ekg was normal    HPI Pt is here to establish care.  He is a well-appearing 48 year old Caucasian male.  He has a history of hypertension and GERD.  He also reports a history of chest pain for the last 2 to 3 years that he has been seeing cardiology for.  They not been able to make a diagnosis at this time.  He has had normal EKGs.  He does complain of a nagging cough for the past 10 days.  He also reports some dizziness as well as remittent headaches.  He denies any head trauma or car accidents.  Current Medication: Outpatient Encounter Medications as of 05/04/2018  Medication Sig  . hydrochlorothiazide (HYDRODIURIL) 12.5 MG tablet Take 12.5 mg by mouth daily.  . Multiple Vitamins-Minerals (CENTRUM SILVER 50+MEN PO) Take by mouth.  . pantoprazole (PROTONIX) 40 MG tablet TAKE 1 TABLET BY MOUTH EVERY DAY  . vitamin B-12 (CYANOCOBALAMIN) 500 MCG tablet Take 500 mcg by mouth daily.  . [DISCONTINUED] metoprolol tartrate (LOPRESSOR) 25 MG tablet Take 25 mg by mouth 2 (two) times daily.  Marland Kitchen losartan (COZAAR) 50 MG tablet Take 1 tablet (50 mg total) by mouth daily.  . [DISCONTINUED] dexlansoprazole (DEXILANT) 60 MG capsule Take 1 capsule (60 mg total) by mouth daily. (Patient not taking: Reported on 12/09/2016)  . [DISCONTINUED] esomeprazole (NEXIUM) 20 MG capsule Take 20 mg by mouth daily at 12 noon.  . [DISCONTINUED] hydrocortisone (ANUSOL-HC) 25 MG suppository Place rectally.  . [DISCONTINUED] liver  oil-zinc oxide (DESITIN) 40 % ointment Apply 1 application topically as needed for irritation.  . [DISCONTINUED] losartan (COZAAR) 50 MG tablet TAKE 1 TABLET BY MOUTH DAILY *FILL AFTER 3/29  . [DISCONTINUED] Multiple Vitamins-Minerals (EMERGEN-C VITAMIN C PO) Take by mouth daily.  . [DISCONTINUED] predniSONE (DELTASONE) 20 MG tablet Take 40 mg by mouth daily.   No facility-administered encounter medications on file as of 05/04/2018.     Surgical History: Past Surgical History:  Procedure Laterality Date  . COLONOSCOPY WITH PROPOFOL N/A 12/16/2016   Procedure: COLONOSCOPY WITH PROPOFOL;  Surgeon: Lucilla Lame, MD;  Location: [facility];  Service: Endoscopy;  Laterality: N/A;  . ESOPHAGOGASTRODUODENOSCOPY (EGD) WITH PROPOFOL N/A 12/16/2016   Procedure: ESOPHAGOGASTRODUODENOSCOPY (EGD) WITH PROPOFOL;  Surgeon: Lucilla Lame, MD;  Location: [facility];  Service: Endoscopy;  Laterality: N/A;  . KNEE SURGERY Left   . POLYPECTOMY  12/16/2016   Procedure: POLYPECTOMY;  Surgeon: Lucilla Lame, MD;  Location: [facility];  Service: Endoscopy;;  . vastectomy  1999    Medical History: Past Medical History:  Diagnosis Date  . Blood in stool    dark pebble like per pt  x4 wks  . Chest pain    on going per pt-x 53mo  . Chronic ankle pain    Right, s/p TDV(7616)  . Chronic hip pain    right, s/p WVP(7106)  . Curvature of spine   . Dyspnea   . Frequent  nosebleeds    x 2 mo per pt (march 2018)  . GERD (gastroesophageal reflux disease)   . Head injury 04/2016      . Hemorrhoids   . Hypertension   . Memory changes    x 1 yr-since 2017  . Migraine    since 2013    Family History: Family History  Problem Relation Age of Onset  . Hypertension Mother   . Breast cancer Mother   . CAD Father   . Heart attack Father   . Bladder Cancer Brother     Social History   Socioeconomic History  . Marital status: Married    Spouse name: Not on file  . Number of children:  Not on file  . Years of education: Not on file  . Highest education level: Not on file  Occupational History  . Not on file  Social Needs  . Financial resource strain: Not on file  . Food insecurity:    Worry: Not on file    Inability: Not on file  . Transportation needs:    Medical: Not on file    Non-medical: Not on file  Tobacco Use  . Smoking status: Never Smoker  . Smokeless tobacco: Never Used  Substance and Sexual Activity  . Alcohol use: No  . Drug use: No  . Sexual activity: Yes  Lifestyle  . Physical activity:    Days per week: Not on file    Minutes per session: Not on file  . Stress: Not on file  Relationships  . Social connections:    Talks on phone: Not on file    Gets together: Not on file    Attends religious service: Not on file    Active member of club or organization: Not on file    Attends meetings of clubs or organizations: Not on file    Relationship status: Not on file  . Intimate partner violence:    Fear of current or ex partner: Not on file    Emotionally abused: Not on file    Physically abused: Not on file    Forced sexual activity: Not on file  Other Topics Concern  . Not on file  Social History Narrative  . Not on file     Review of Systems  Constitutional: Negative.  Negative for chills, fatigue and unexpected weight change.  HENT: Negative.  Negative for congestion, rhinorrhea, sneezing and sore throat.   Eyes: Negative for redness.  Respiratory: Positive for cough. Negative for chest tightness and shortness of breath.   Cardiovascular: Positive for chest pain. Negative for palpitations.  Gastrointestinal: Negative.  Negative for abdominal pain, constipation, diarrhea, nausea and vomiting.  Endocrine: Negative.   Genitourinary: Negative.  Negative for dysuria and frequency.  Musculoskeletal: Negative.  Negative for arthralgias, back pain, joint swelling and neck pain.  Skin: Negative.  Negative for rash.  Allergic/Immunologic:  Negative.   Neurological: Negative.  Negative for tremors and numbness.  Hematological: Negative for adenopathy. Does not bruise/bleed easily.  Psychiatric/Behavioral: Negative.  Negative for behavioral problems, sleep disturbance and suicidal ideas. The patient is not nervous/anxious.     Vital Signs: BP (!) 130/97 (BP Location: Left Arm, Patient Position: Sitting, Cuff Size: Normal)   Pulse 61   Resp 16   Ht 5' 9.5" (1.765 m)   Wt 266 lb (120.7 kg)   SpO2 96%   BMI 38.72 kg/m    Physical Exam Vitals signs and nursing note reviewed.  Constitutional:  General: He is not in acute distress.    Appearance: He is well-developed. He is not diaphoretic.  HENT:     Head: Normocephalic and atraumatic.     Mouth/Throat:     Pharynx: No oropharyngeal exudate.  Eyes:     Pupils: Pupils are equal, round, and reactive to light.  Neck:     Musculoskeletal: Normal range of motion and neck supple.     Thyroid: No thyromegaly.     Vascular: No JVD.     Trachea: No tracheal deviation.  Cardiovascular:     Rate and Rhythm: Normal rate and regular rhythm.     Heart sounds: Normal heart sounds. No murmur. No friction rub. No gallop.   Pulmonary:     Effort: Pulmonary effort is normal. No respiratory distress.     Breath sounds: Normal breath sounds. No wheezing or rales.  Chest:     Chest wall: No tenderness.  Abdominal:     Palpations: Abdomen is soft.     Tenderness: There is no abdominal tenderness. There is no guarding.  Musculoskeletal: Normal range of motion.  Lymphadenopathy:     Cervical: No cervical adenopathy.  Skin:    General: Skin is warm and dry.  Neurological:     Mental Status: He is alert and oriented to person, place, and time.     Cranial Nerves: No cranial nerve deficit.     Comments: Nystagmus noted on neuro exam.   Psychiatric:        Behavior: Behavior normal.        Thought Content: Thought content normal.        Judgment: Judgment normal.     Assessment/Plan: 1. Essential hypertension Stable, restarting patient on losartan at this time.  He will follow-up in 6 weeks on his blood pressure and to receive physical. - losartan (COZAAR) 50 MG tablet; Take 1 tablet (50 mg total) by mouth daily.  Dispense: 30 tablet; Refill: 1  2. Nystagmus PT has nystagmus on neuro exam. Denies any head trauma. Reports some visual distrubances and dizziness intermittently with headaches.  Will get MR brain.   3. Chronic nonintractable headache, unspecified headache type Headaches intermittently, concerning due to other symptoms.  - MR Brain W Wo Contrast; Future  4. Visual disturbance Patient reports intermittent visual disturbance.  This in combination with headaches and dizziness of about to get an MRI of the brain.  5. Dizziness Dizziness intermittently see above.  6. Gastroesophageal reflux disease without esophagitis Stable, continue to use Protonix as prescribed.  General Counseling: Manuel Cain understanding of the findings of todays visit and agrees with plan of treatment. I have discussed any further diagnostic evaluation that may be needed or ordered today. We also reviewed his medications today. he has been encouraged to call the office with any questions or concerns that should arise related to todays visit.  Orders Placed This Encounter  Procedures  . MR Brain W Wo Contrast    Meds ordered this encounter  Medications  . losartan (COZAAR) 50 MG tablet    Sig: Take 1 tablet (50 mg total) by mouth daily.    Dispense:  30 tablet    Refill:  1    Time spent: 25 Minutes   This patient was seen by Orson Gear AGNP-C in Collaboration with Dr Lavera Guise as a part of collaborative care agreement  Kendell Bane AGNP-C Internal Medicine

## 2018-05-09 ENCOUNTER — Telehealth: Payer: Self-pay | Admitting: Adult Health

## 2018-05-09 MED ORDER — VALSARTAN 160 MG PO TABS: 160.0000 mg | ORAL_TABLET | Freq: Every day | ORAL | 3 refills | Status: DC

## 2018-05-09 NOTE — Telephone Encounter (Signed)
Medication losartan is on Backorder , need something else called into pharmacy,

## 2018-05-25 ENCOUNTER — Encounter: Payer: Self-pay | Admitting: Adult Health

## 2018-05-25 ENCOUNTER — Other Ambulatory Visit: Payer: Self-pay

## 2018-05-25 ENCOUNTER — Ambulatory Visit: Payer: BLUE CROSS/BLUE SHIELD | Admitting: Nurse Practitioner

## 2018-05-25 ENCOUNTER — Other Ambulatory Visit: Payer: Self-pay | Admitting: Nurse Practitioner

## 2018-05-25 VITALS — BP 146/82 | HR 84 | Temp 98.4°F | Resp 16 | Ht 69.5 in | Wt 269.0 lb

## 2018-05-25 DIAGNOSIS — J069 Acute upper respiratory infection, unspecified: Secondary | ICD-10-CM | POA: Insufficient documentation

## 2018-05-25 DIAGNOSIS — R059 Cough, unspecified: Secondary | ICD-10-CM

## 2018-05-25 DIAGNOSIS — K219 Gastro-esophageal reflux disease without esophagitis: Secondary | ICD-10-CM | POA: Diagnosis not present

## 2018-05-25 DIAGNOSIS — I10 Essential (primary) hypertension: Secondary | ICD-10-CM

## 2018-05-25 DIAGNOSIS — R05 Cough: Secondary | ICD-10-CM | POA: Diagnosis not present

## 2018-05-25 MED ORDER — PREDNISONE 10 MG (21) PO TBPK: ORAL_TABLET | ORAL | 0 refills | Status: DC

## 2018-05-25 MED ORDER — PANTOPRAZOLE SODIUM 40 MG PO TBEC: 40.0000 mg | DELAYED_RELEASE_TABLET | Freq: Every day | ORAL | 5 refills | Status: DC

## 2018-05-25 MED ORDER — AZITHROMYCIN 250 MG PO TABS: ORAL_TABLET | ORAL | 0 refills | Status: DC

## 2018-05-25 MED ORDER — METOPROLOL TARTRATE 25 MG PO TABS: 25.0000 mg | ORAL_TABLET | Freq: Two times a day (BID) | ORAL | 5 refills | Status: DC

## 2018-05-25 MED ORDER — HYDROCHLOROTHIAZIDE 12.5 MG PO TABS: 12.5000 mg | ORAL_TABLET | Freq: Every day | ORAL | 5 refills | Status: DC

## 2018-05-25 NOTE — Progress Notes (Signed)
[facility] [place], [place] [address]  Internal MEDICINE  Office Visit Note  Patient Name: Manuel Cain  [id number]  [id number]  Date of Service: 05/25/2018   Pt is here for a sick visit.  Chief Complaint  Patient presents with  . Diarrhea    3 or 4 days   . Headache    ibuprofen and tylenol around the clock   . Cough    since last visit , nagging   . Hypertension    valsartan can not take , it is too big to swallow      The patient started feeling sick, with what he thought was the flu, 04/17/2018. Had cough, high fever, headache, and chills. Eventually got better, however, cough has remained persistent. Hurts in his chest when he coughs. Has not been treated for URI at all. Denies any positive exposure to COVID 19. Has done some travelling within the state of [place], but has had no international travel.        Current Medication:  Outpatient Encounter Medications as of 05/25/2018  Medication Sig Note  . hydrochlorothiazide (HYDRODIURIL) 12.5 MG tablet Take 1 tablet (12.5 mg total) by mouth daily.   . Multiple Vitamins-Minerals (CENTRUM SILVER 50+MEN PO) Take by mouth.   . pantoprazole (PROTONIX) 40 MG tablet Take 1 tablet (40 mg total) by mouth daily.   . vitamin B-12 (CYANOCOBALAMIN) 500 MCG tablet Take 500 mcg by mouth daily.   . [DISCONTINUED] hydrochlorothiazide (HYDRODIURIL) 12.5 MG tablet Take 12.5 mg by mouth daily.   . [DISCONTINUED] pantoprazole (PROTONIX) 40 MG tablet TAKE 1 TABLET BY MOUTH EVERY DAY   . [DISCONTINUED] valsartan (DIOVAN) 160 MG tablet Take 1 tablet (160 mg total) by mouth daily. 05/25/2018: unable to swallow these large pills.   Marland Kitchen azithromycin (ZITHROMAX) 250 MG tablet z-pack - take as directed for 5 days   . metoprolol tartrate (LOPRESSOR) 25 MG tablet Take 1 tablet (25 mg total) by mouth 2 (two) times daily.   . predniSONE (STERAPRED UNI-PAK 21 TAB) 10 MG (21) TBPK tablet 6 day taper - take by mouth as  directed for 6 days   . [DISCONTINUED] losartan (COZAAR) 50 MG tablet Take 1 tablet (50 mg total) by mouth daily. (Patient not taking: Reported on 05/25/2018) 05/25/2018: patient unable to swallow these large pills and has gone back to metoprolol    No facility-administered encounter medications on file as of 05/25/2018.       Medical History: Past Medical History:  Diagnosis Date  . Blood in stool    dark pebble like per pt  x4 wks  . Chest pain    on going per pt-x 36mo  . Chronic ankle pain    Right, s/p HQI(6962)  . Chronic hip pain    right, s/p XBM(8413)  . Curvature of spine   . Dyspnea   . Frequent nosebleeds    x 2 mo per pt (march 2018)  . GERD (gastroesophageal reflux disease)   . Head injury 04/2016      . Hemorrhoids   . Hypertension   . Memory changes    x 1 yr-since 2017  . Migraine    since 2013     Today's Vitals   05/25/18 0859  BP: (!) 146/82  Pulse: 84  Resp: 16  Temp: 98.4 F (36.9 C)  SpO2: 97%  Weight: 269 lb (122 kg)  Height: 5' 9.5" (1.765 m)   Body mass index is 39.15  kg/m.  Review of Systems  Constitutional: Positive for fatigue. Negative for activity change, chills, fever and unexpected weight change.  HENT: Positive for congestion and postnasal drip. Negative for rhinorrhea, sneezing, sore throat and voice change.   Respiratory: Positive for cough. Negative for chest tightness, shortness of breath and wheezing.   Cardiovascular: Negative for chest pain and palpitations.       Elevated blood pressure   Gastrointestinal: Positive for diarrhea. Negative for abdominal pain, constipation, nausea and vomiting.       Intermittent diarrhea over past few days   Musculoskeletal: Negative for arthralgias, back pain, joint swelling and neck pain.  Skin: Negative for rash.  Neurological: Positive for headaches. Negative for tremors and numbness.  Hematological: Negative for adenopathy. Does not bruise/bleed easily.  Psychiatric/Behavioral:  Negative for behavioral problems (Depression), sleep disturbance and suicidal ideas. The patient is nervous/anxious.     Physical Exam Vitals signs and nursing note reviewed.  Constitutional:      General: He is not in acute distress.    Appearance: Normal appearance. He is well-developed. He is not diaphoretic.  HENT:     Head: Normocephalic and atraumatic.     Mouth/Throat:     Pharynx: No oropharyngeal exudate.  Eyes:     Conjunctiva/sclera: Conjunctivae normal.     Pupils: Pupils are equal, round, and reactive to light.  Neck:     Musculoskeletal: Normal range of motion and neck supple.     Thyroid: No thyromegaly.     Vascular: No JVD.     Trachea: No tracheal deviation.  Cardiovascular:     Rate and Rhythm: Normal rate and regular rhythm.     Heart sounds: Normal heart sounds. No murmur. No friction rub. No gallop.   Pulmonary:     Effort: Pulmonary effort is normal. No respiratory distress.     Breath sounds: Normal breath sounds. No wheezing or rales.     Comments: Dry, non-productive cough present.  Chest:     Chest wall: No tenderness.  Abdominal:     General: Bowel sounds are normal.     Palpations: Abdomen is soft.     Tenderness: There is no abdominal tenderness.  Musculoskeletal: Normal range of motion.  Lymphadenopathy:     Cervical: No cervical adenopathy.  Skin:    General: Skin is warm and dry.  Neurological:     Mental Status: He is alert and oriented to person, place, and time.     Cranial Nerves: No cranial nerve deficit.  Psychiatric:        Behavior: Behavior normal.        Thought Content: Thought content normal.        Judgment: Judgment normal.   Assessment/Plan: 1. Acute upper respiratory infection Start z-pack. Take as directed for 5 days. Add prednisone 10mg  dose pack. Take as directed for 6 days. Continue OTC medication as needed and as indicated  - predniSONE (STERAPRED UNI-PAK 21 TAB) 10 MG (21) TBPK tablet; 6 day taper - take by mouth  as directed for 6 days  Dispense: 21 tablet; Refill: 0 - azithromycin (ZITHROMAX) 250 MG tablet; z-pack - take as directed for 5 days  Dispense: 6 tablet; Refill: 0  2. Cough Prednisone dose pack. Take as directed. Use OTC delsym as needed for cough  - predniSONE (STERAPRED UNI-PAK 21 TAB) 10 MG (21) TBPK tablet; 6 day taper - take by mouth as directed for 6 days  Dispense: 21 tablet; Refill: 0  3. Essential  hypertension Go back to metoprolol 25mg  bid and HCTZ daily. New prescriptions sent to his pharmacy.  - hydrochlorothiazide (HYDRODIURIL) 12.5 MG tablet; Take 1 tablet (12.5 mg total) by mouth daily.  Dispense: 30 tablet; Refill: 5 - metoprolol tartrate (LOPRESSOR) 25 MG tablet; Take 1 tablet (25 mg total) by mouth 2 (two) times daily.  Dispense: 60 tablet; Refill: 5  4. Gastroesophageal reflux disease without esophagitis - pantoprazole (PROTONIX) 40 MG tablet; Take 1 tablet (40 mg total) by mouth daily.  Dispense: 30 tablet; Refill: 5  General Counseling: Manuel Cain verbalizes understanding of the findings of todays visit and agrees with plan of treatment. I have discussed any further diagnostic evaluation that may be needed or ordered today. We also reviewed his medications today. he has been encouraged to call the office with any questions or concerns that should arise related to todays visit.    Counseling:  Rest and increase fluids. Continue using OTC medication to control symptoms.   This patient was seen by [place] with Dr Lavera Guise as a part of collaborative care agreement  Meds ordered this encounter  Medications  . predniSONE (STERAPRED UNI-PAK 21 TAB) 10 MG (21) TBPK tablet    Sig: 6 day taper - take by mouth as directed for 6 days    Dispense:  21 tablet    Refill:  0    Order Specific Question:   Supervising Provider    Answer:   Lavera Guise [place]  . azithromycin (ZITHROMAX) 250 MG tablet    Sig: z-pack - take as directed for 5 days     Dispense:  6 tablet    Refill:  0    Order Specific Question:   Supervising Provider    Answer:   Lavera Guise [place]  . hydrochlorothiazide (HYDRODIURIL) 12.5 MG tablet    Sig: Take 1 tablet (12.5 mg total) by mouth daily.    Dispense:  30 tablet    Refill:  5    Order Specific Question:   Supervising Provider    Answer:   Lavera Guise [[id number]]  . pantoprazole (PROTONIX) 40 MG tablet    Sig: Take 1 tablet (40 mg total) by mouth daily.    Dispense:  30 tablet    Refill:  5    Order Specific Question:   Supervising Provider    Answer:   Lavera Guise [[id number]]  . metoprolol tartrate (LOPRESSOR) 25 MG tablet    Sig: Take 1 tablet (25 mg total) by mouth 2 (two) times daily.    Dispense:  60 tablet    Refill:  5    Please d/c valsartan. Patient unable to tolerate    Order Specific Question:   Supervising Provider    Answer:   Lavera Guise [[id number]]    Time spent: 15 Minutes

## 2018-06-02 ENCOUNTER — Telehealth: Payer: Self-pay | Admitting: Adult Health

## 2018-06-02 ENCOUNTER — Telehealth: Payer: Self-pay

## 2018-06-02 ENCOUNTER — Other Ambulatory Visit: Payer: Self-pay

## 2018-06-02 ENCOUNTER — Emergency Department: Payer: BLUE CROSS/BLUE SHIELD

## 2018-06-02 ENCOUNTER — Emergency Department
Admission: EM | Admit: 2018-06-02 | Discharge: 2018-06-02 | Disposition: A | Discharge status: Home / Self Care | Payer: BLUE CROSS/BLUE SHIELD | Attending: Emergency Medicine | Admitting: Emergency Medicine

## 2018-06-02 ENCOUNTER — Ambulatory Visit: Payer: Self-pay | Admitting: Adult Health

## 2018-06-02 DIAGNOSIS — Z79899 Other long term (current) drug therapy: Secondary | ICD-10-CM | POA: Diagnosis not present

## 2018-06-02 DIAGNOSIS — I10 Essential (primary) hypertension: Secondary | ICD-10-CM | POA: Diagnosis not present

## 2018-06-02 DIAGNOSIS — F419 Anxiety disorder, unspecified: Secondary | ICD-10-CM

## 2018-06-02 DIAGNOSIS — R05 Cough: Secondary | ICD-10-CM | POA: Diagnosis present

## 2018-06-02 LAB — CBC WITH DIFFERENTIAL/PLATELET
Abs Immature Granulocytes: 0.14 10*3/uL — ABNORMAL HIGH (ref 0.00–0.07)
Basophils Absolute: 0.1 10*3/uL (ref 0.0–0.1)
Basophils Relative: 0 %
Eosinophils Absolute: 0.1 10*3/uL (ref 0.0–0.5)
Eosinophils Relative: 1 %
HCT: 46.4 % (ref 39.0–52.0)
Hemoglobin: 15.7 g/dL (ref 13.0–17.0)
Immature Granulocytes: 1 %
Lymphocytes Relative: 20 %
Lymphs Abs: 3.4 10*3/uL (ref 0.7–4.0)
MCH: 27.8 pg (ref 26.0–34.0)
MCHC: 33.8 g/dL (ref 30.0–36.0)
MCV: 82.3 fL (ref 80.0–100.0)
Monocytes Absolute: 0.8 10*3/uL (ref 0.1–1.0)
Monocytes Relative: 5 %
Neutro Abs: 12.5 10*3/uL — ABNORMAL HIGH (ref 1.7–7.7)
Neutrophils Relative %: 73 %
Platelets: 379 10*3/uL (ref 150–400)
RBC: 5.64 MIL/uL (ref 4.22–5.81)
RDW: 13.7 % (ref 11.5–15.5)
WBC: 17 10*3/uL — ABNORMAL HIGH (ref 4.0–10.5)
nRBC: 0 % (ref 0.0–0.2)

## 2018-06-02 LAB — TROPONIN I: Troponin I: <0.03 ng/mL (ref <0.03)

## 2018-06-02 LAB — BASIC METABOLIC PANEL
Anion gap: 11 (ref 5–15)
BUN: 12 mg/dL (ref 6–20)
CO2: 27 mmol/L (ref 22–32)
Calcium: 9.8 mg/dL (ref 8.9–10.3)
Chloride: 101 mmol/L (ref 98–111)
Creatinine, Ser: 1 mg/dL (ref 0.61–1.24)
GFR calc Af Amer: >60 mL/min (ref >60)
GFR calc non Af Amer: >60 mL/min (ref >60)
Glucose, Bld: 110 mg/dL — ABNORMAL HIGH (ref 70–99)
Potassium: 3.9 mmol/L (ref 3.5–5.1)
Sodium: 139 mmol/L (ref 135–145)

## 2018-06-02 IMAGING — DX PORTABLE CHEST - 1 VIEW
1 series · 1 of 1 positions shown · non-contrast
Comparison: None.

CLINICAL DATA: 47-year-old male with cough

EXAM:
PORTABLE CHEST 1 VIEW

[chest ap]
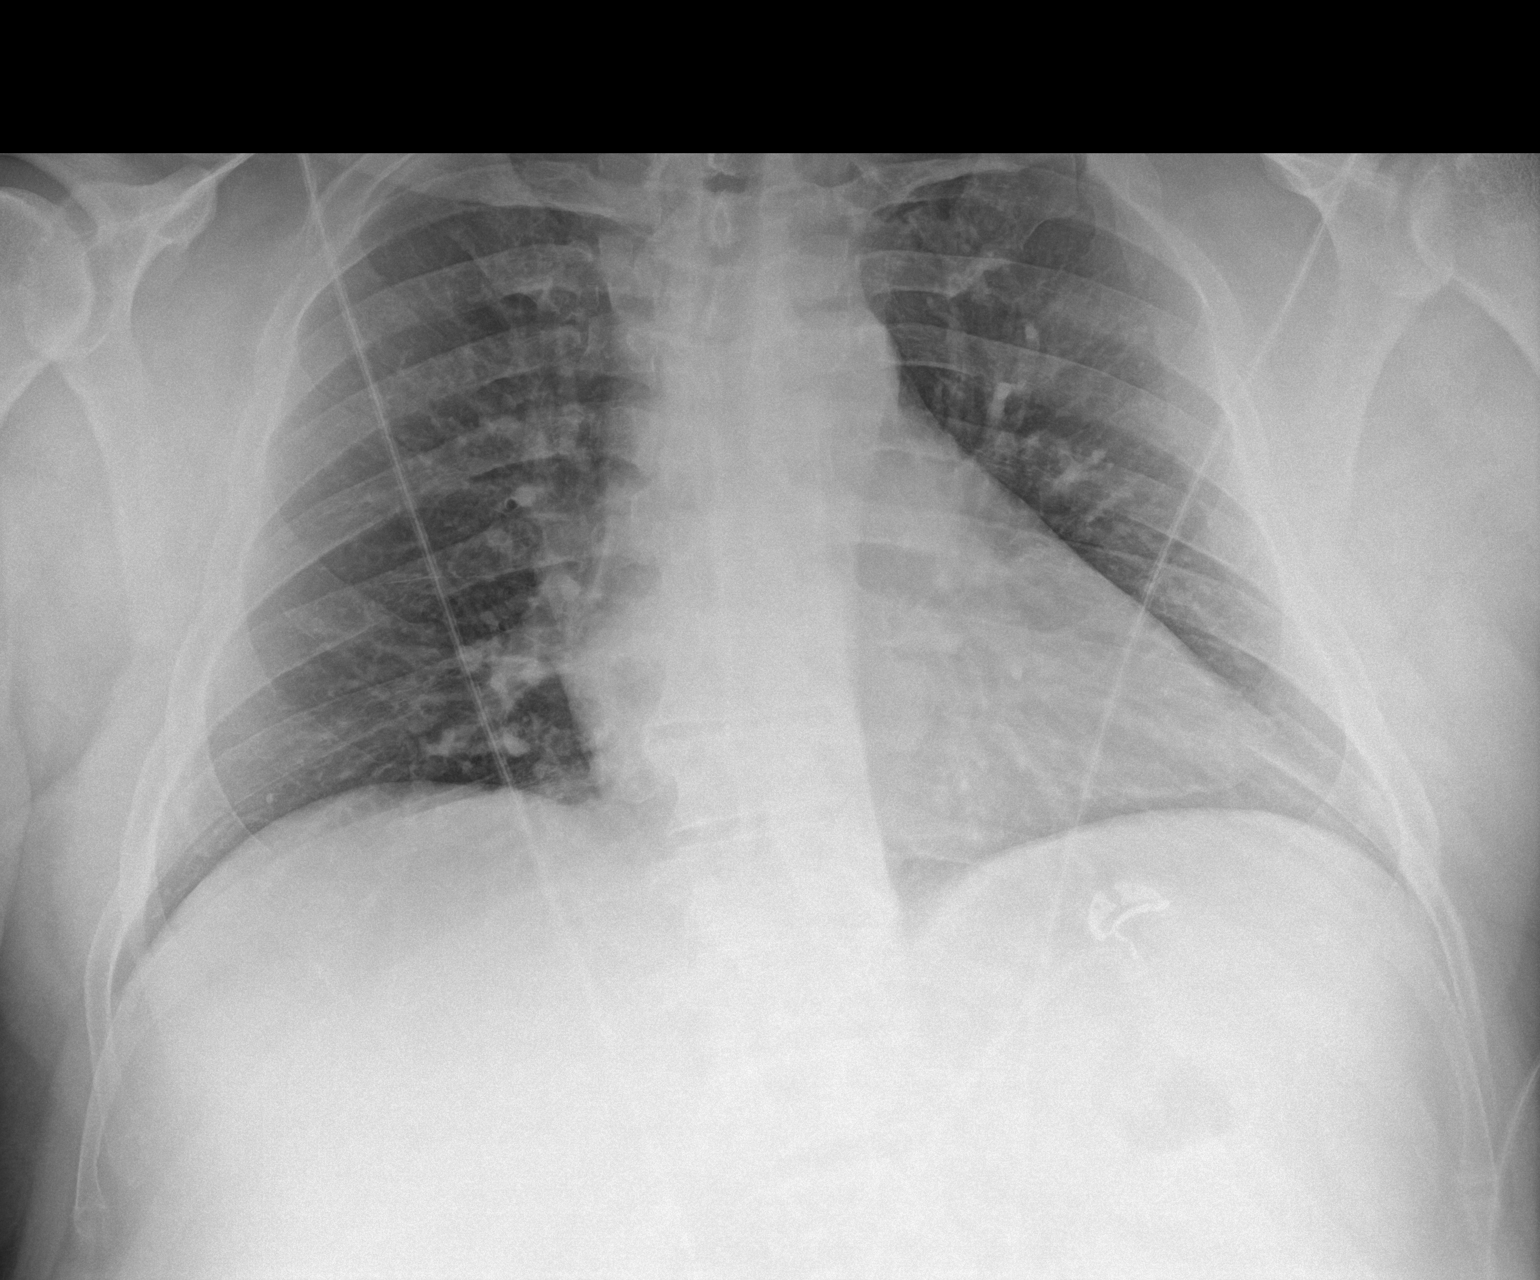

[1 of 1 positions shown; findings below may reference images not displayed]

FINDINGS: Cardiomediastinal silhouette within normal limits. No evidence of
central vascular congestion. No pneumothorax or pleural effusion. No
confluent airspace disease. No displaced fracture.
IMPRESSION: Negative for acute cardiopulmonary disease

## 2018-06-02 MED ORDER — HYDROXYZINE HCL 50 MG PO TABS: 50.0000 mg | ORAL_TABLET | Freq: Three times a day (TID) | ORAL | 0 refills | Status: DC | PRN

## 2018-06-02 MED ORDER — ALBUTEROL SULFATE HFA 108 (90 BASE) MCG/ACT IN AERS: 2.0000 | INHALATION_SPRAY | RESPIRATORY_TRACT | 0 refills | Status: DC | PRN

## 2018-06-02 NOTE — Telephone Encounter (Signed)
Patient's wife called concerned with her husband getting worse , patients bp is 181/96 pulse 116 bp as low as 169/95 pulse 64 wife she states that he has finished the antibiotic and prednisone, his coughing has got worse , diarrhea is worse and headache , has developed a low grade fever of 100. And also experiencing chest tightness. Upon speaking with [place], concerned with bp being high and chest pain, patient is to go to emergency room .

## 2018-06-02 NOTE — Discharge Instructions (Signed)
Return to the ER for new, worsening, or persistent chest discomfort, vomiting, weakness, or any shortness of breath or high fever.  Follow-up with your regular doctor in 1 to 2 weeks to have your blood pressure rechecked.

## 2018-06-02 NOTE — Telephone Encounter (Signed)
Pt wife called pt was seen on 3/26 he still having diarrhea,nausea,coughing and low grade feverand high bp as per dr Humphrey Rolls we make telephone visit with adam today

## 2018-06-02 NOTE — ED Triage Notes (Signed)
Pt states that he was diagnosed with the flu in feb and has had a cough since, pt also reports hx of htn and states that his bp has been elevated since last night and reports feeling anxious

## 2018-06-02 NOTE — ED Provider Notes (Signed)
[facility] Emergency Department Provider Note ____________________________________________   First MD Initiated Contact with Patient 06/02/18 1206     (approximate)  I have reviewed the triage vital signs and the nursing notes.   HISTORY  Chief Complaint Cough and Chest Pain    HPI Manuel Cain is a 48 y.o. male with PMH as noted below who presents with cough, chest discomfort, and elevated blood pressure over the last 2 days.  The patient states he initially had a flulike illness in February and a persistent intermittent cough since then.  The patient states that over the last 2 days he has had persistent cough, some chest discomfort, and his blood pressure has been elevated at home.  He was seen as an outpatient last week and started on the Z-Pak and prednisone due to his cough, and his blood pressure medication was adjusted.  He states that last night he became acutely anxious due to his blood pressure as well as concern that he may have COVID-19.  He denies any travel outside of the state or exposures to high risk individuals.  He denies any shortness of breath or fever.  Past Medical History:  Diagnosis Date  . Blood in stool    dark pebble like per pt  x4 wks  . Chest pain    on going per pt-x 97mo  . Chronic ankle pain    Right, s/p TYO(0600)  . Chronic hip pain    right, s/p KHT(9774)  . Curvature of spine   . Dyspnea   . Frequent nosebleeds    x 2 mo per pt (march 2018)  . GERD (gastroesophageal reflux disease)   . Head injury 04/2016      . Hemorrhoids   . Hypertension   . Memory changes    x 1 yr-since 2017  . Migraine    since 2013    Patient Active Problem List   Diagnosis Date Noted  . Acute upper respiratory infection 05/25/2018  . Cough 05/25/2018  . Hematochezia   . Benign neoplasm of transverse colon   . Rectal polyp   . Gastroesophageal reflux disease   . Vaccine counseling 06/10/2016  . Essential hypertension  10/25/2015  . Morbid obesity with BMI of 40.0-44.9, adult ([place]) 10/25/2015  . Leukocytosis 10/25/2015  . Chest pain, unspecified 10/24/2015    Past Surgical History:  Procedure Laterality Date  . COLONOSCOPY WITH PROPOFOL N/A 12/16/2016   Procedure: COLONOSCOPY WITH PROPOFOL;  Surgeon: Lucilla Lame, MD;  Location: [place];  Service: Endoscopy;  Laterality: N/A;  . ESOPHAGOGASTRODUODENOSCOPY (EGD) WITH PROPOFOL N/A 12/16/2016   Procedure: ESOPHAGOGASTRODUODENOSCOPY (EGD) WITH PROPOFOL;  Surgeon: Lucilla Lame, MD;  Location: [place];  Service: Endoscopy;  Laterality: N/A;  . KNEE SURGERY Left   . POLYPECTOMY  12/16/2016   Procedure: POLYPECTOMY;  Surgeon: Lucilla Lame, MD;  Location: [place];  Service: Endoscopy;;  . vastectomy  1999    Prior to Admission medications   Medication Sig Start Date End Date Taking? Authorizing Provider  albuterol (PROVENTIL HFA;VENTOLIN HFA) 108 (90 Base) MCG/ACT inhaler Inhale 2 puffs into the lungs every 4 (four) hours as needed for shortness of breath (or chest tightness). 06/02/18   Arta Silence, MD  azithromycin (ZITHROMAX) 250 MG tablet z-pack - take as directed for 5 days 05/25/18   Ronnell Freshwater, NP  hydrochlorothiazide (HYDRODIURIL) 12.5 MG tablet Take 1 tablet (12.5 mg total) by mouth daily. 05/25/18   Ronnell Freshwater,  NP  hydrOXYzine (ATARAX/VISTARIL) 50 MG tablet Take 1 tablet (50 mg total) by mouth every 8 (eight) hours as needed for anxiety. 06/02/18   Arta Silence, MD  metoprolol tartrate (LOPRESSOR) 25 MG tablet Take 1 tablet (25 mg total) by mouth 2 (two) times daily. 05/25/18   Ronnell Freshwater, NP  Multiple Vitamins-Minerals (CENTRUM SILVER 50+MEN PO) Take by mouth.    [provider]  pantoprazole (PROTONIX) 40 MG tablet Take 1 tablet (40 mg total) by mouth daily. 05/25/18   Ronnell Freshwater, NP  predniSONE (STERAPRED UNI-PAK 21 TAB) 10 MG (21) TBPK tablet 6 day taper - take by mouth  as directed for 6 days 05/25/18   Ronnell Freshwater, NP  vitamin B-12 (CYANOCOBALAMIN) 500 MCG tablet Take 500 mcg by mouth daily.    [provider]    Allergies Valium [diazepam]  Family History  Problem Relation Age of Onset  . Hypertension Mother   . Breast cancer Mother   . CAD Father   . Heart attack Father   . Bladder Cancer Brother     Social History Social History   Tobacco Use  . Smoking status: Never Smoker  . Smokeless tobacco: Never Used  Substance Use Topics  . Alcohol use: No  . Drug use: No    Review of Systems  Constitutional: No fever/chills. Eyes: No redness. ENT: No sore throat. Cardiovascular: Positive for chest pain. Respiratory: Denies shortness of breath. Gastrointestinal: No vomiting or diarrhea. Genitourinary: Negative for flank pain.  Musculoskeletal: Negative for back pain. Skin: Negative for rash. Neurological: Negative for headache.   ____________________________________________   PHYSICAL EXAM:  VITAL SIGNS: ED Triage Vitals [06/02/18 1153]  Enc Vitals Group     BP (!) 185/105     Pulse Rate 88     Resp 18     Temp 98.1 F (36.7 C)     Temp Source Oral     SpO2 99 %     Weight 265 lb (120.2 kg)     Height 5\' 9"  (1.753 m)     Head Circumference      Peak Flow      Pain Score 0     Pain Loc      Pain Edu?      Excl. in [place]?     Constitutional: Alert and oriented. Well appearing and in no acute distress. Eyes: Conjunctivae are normal.  Head: Atraumatic. Nose: No congestion/rhinnorhea. Mouth/Throat: Mucous membranes are moist.   Neck: Normal range of motion.  Cardiovascular: Normal rate, regular rhythm. Grossly normal heart sounds.  Good peripheral circulation. Respiratory: Normal respiratory effort.  No retractions. Lungs CTAB. Gastrointestinal: No distention.  Musculoskeletal: Extremities warm and well perfused.  Neurologic:  Normal speech and language. No gross focal neurologic deficits are appreciated.   Skin:  Skin is warm and dry. No rash noted. Psychiatric: Anxious appearing.  Speech and behavior are normal.  ____________________________________________   LABS (all labs ordered are listed, but only abnormal results are displayed)  Labs Reviewed  BASIC METABOLIC PANEL - Abnormal; Notable for the following components:      Result Value   Glucose, Bld 110 (*)    All other components within normal limits  CBC WITH DIFFERENTIAL/PLATELET - Abnormal; Notable for the following components:   WBC 17.0 (*)    Neutro Abs 12.5 (*)    Abs Immature Granulocytes 0.14 (*)    All other components within normal limits  TROPONIN I   ____________________________________________  EKG  ED ECG REPORT I, Arta Silence, the attending physician, personally viewed and interpreted this ECG.  Date: 06/02/2018 EKG Time: 1200 Rate: 63 Rhythm: normal sinus rhythm QRS Axis: normal Intervals: normal ST/T Wave abnormalities: normal Narrative Interpretation: no evidence of acute ischemia  ____________________________________________  RADIOLOGY  CXR: No focal infiltrate or other acute abnormality  ____________________________________________   PROCEDURES  Procedure(s) performed: No  Procedures  Critical Care performed: No ____________________________________________   INITIAL IMPRESSION / ASSESSMENT AND PLAN / ED COURSE  Pertinent labs & imaging results that were available during my care of the patient were reviewed by me and considered in my medical decision making (see chart for details).  48 year old male with PMH as noted above presents with persistent cough over approximately the last 6 weeks, as well as elevated blood pressure, chest discomfort, and anxiety in the last 2 days.  He has no travel history or high risk exposures to COVID-19.  He has just completed a Z-Pak and a course of prednisone.  On exam, the patient is anxious but well-appearing.  His blood pressure is  slightly elevated but his other vital signs are normal.  The remainder of the exam is unremarkable.  His lungs are clear and he has no increased work of breathing.  Overall I suspect that the cough is due to persistent mild bronchitis/bronchospasm after a viral illness.  However, his presentation would be very atypical for COVID-19.  The chest discomfort and elevated blood pressure are consistent with anxiety, the patient reports that he became acutely anxious and panicked last night.  We will obtain a chest x-ray as well as basic labs and a troponin to rule out cardiac ischemia or other end organ dysfunction.  These are negative anticipate discharge home.  The patient does not meet COVID-19 testing criteria.  ----------------------------------------- 1:57 PM on 06/02/2018 -----------------------------------------  Chest x-ray and labs are unremarkable except for elevated WBC count which is most likely explained by the patient's steroid use rather than any acute infection.  At this time, he is stable for discharge home.  I counseled him on the results of the work-up.  I will prescribe albuterol for the bronchospasm as well as hydroxyzine for anxiety.  The patient states that he is already self isolating although he is not a formal PUI for COVID-19.  Return precautions given, and he expresses understanding.  _____  [name] was evaluated in Emergency Department on 06/02/2018 for the symptoms described in the history of present illness. He was evaluated in the context of the global COVID-19 pandemic, which necessitated consideration that the patient might be at risk for infection with the SARS-CoV-2 virus that causes COVID-19. Institutional protocols and algorithms that pertain to the evaluation of patients at risk for COVID-19 are in a state of rapid change based on information released by regulatory bodies including the CDC and federal and state organizations. These policies and algorithms  were followed during the patient's care in the ED.  ____________________________________________   FINAL CLINICAL IMPRESSION(S) / ED DIAGNOSES  Final diagnoses:  Hypertension, unspecified type  Anxiety      NEW MEDICATIONS STARTED DURING THIS VISIT:  New Prescriptions   ALBUTEROL (PROVENTIL HFA;VENTOLIN HFA) 108 (90 BASE) MCG/ACT INHALER    Inhale 2 puffs into the lungs every 4 (four) hours as needed for shortness of breath (or chest tightness).   HYDROXYZINE (ATARAX/VISTARIL) 50 MG TABLET    Take 1 tablet (50 mg total) by mouth every 8 (eight) hours as needed for  anxiety.     Note:  This document was prepared using Dragon voice recognition software and may include unintentional dictation errors.   Arta Silence, MD 06/02/18 1358

## 2018-06-05 ENCOUNTER — Telehealth: Payer: Self-pay | Admitting: Adult Health

## 2018-06-05 NOTE — Telephone Encounter (Signed)
Spoke to Manuel Cain regarding his er trip, he states that he is doing better, he was prescribed a inhaler for his cough and it has opened him up and he had a bad anxiety attack the night before that he believed to contribute to his bp elevation and was given a rx for anxiety .

## 2018-06-07 ENCOUNTER — Other Ambulatory Visit: Payer: Self-pay | Admitting: Adult Health

## 2018-06-08 LAB — IRON AND TIBC
Iron Saturation: 20 % (ref 15–55)
Iron: 59 ug/dL (ref 38–169)
Total Iron Binding Capacity: 294 ug/dL (ref 250–450)
UIBC: 235 ug/dL (ref 111–343)

## 2018-06-08 LAB — B12 AND FOLATE PANEL
Folate: 11 ng/mL (ref >3.0)
Vitamin B-12: 726 pg/mL (ref 232–1245)

## 2018-06-08 LAB — COMPREHENSIVE METABOLIC PANEL
ALT: 28 IU/L (ref 0–44)
AST: 16 IU/L (ref 0–40)
Albumin/Globulin Ratio: 2 (ref 1.2–2.2)
Albumin: 4.5 g/dL (ref 4.0–5.0)
Alkaline Phosphatase: 55 IU/L (ref 39–117)
BUN/Creatinine Ratio: 13 (ref 9–20)
BUN: 14 mg/dL (ref 6–24)
Bilirubin Total: 0.4 mg/dL (ref 0.0–1.2)
CO2: 24 mmol/L (ref 20–29)
Calcium: 9.9 mg/dL (ref 8.7–10.2)
Chloride: 104 mmol/L (ref 96–106)
Creatinine, Ser: 1.04 mg/dL (ref 0.76–1.27)
GFR calc Af Amer: 97 mL/min/{1.73_m2} (ref >59)
GFR calc non Af Amer: 84 mL/min/{1.73_m2} (ref >59)
Globulin, Total: 2.3 g/dL (ref 1.5–4.5)
Glucose: 94 mg/dL (ref 65–99)
Potassium: 4.2 mmol/L (ref 3.5–5.2)
Sodium: 143 mmol/L (ref 134–144)
Total Protein: 6.8 g/dL (ref 6.0–8.5)

## 2018-06-08 LAB — CBC WITH DIFFERENTIAL/PLATELET
Basophils Absolute: 0.1 10*3/uL (ref 0.0–0.2)
Basos: 1 %
EOS (ABSOLUTE): 0.3 10*3/uL (ref 0.0–0.4)
Eos: 2 %
Hematocrit: 44.1 % (ref 37.5–51.0)
Hemoglobin: 14.6 g/dL (ref 13.0–17.7)
Immature Grans (Abs): 0.1 10*3/uL (ref 0.0–0.1)
Immature Granulocytes: 1 %
Lymphocytes Absolute: 4.3 10*3/uL — ABNORMAL HIGH (ref 0.7–3.1)
Lymphs: 34 %
MCH: 27.5 pg (ref 26.6–33.0)
MCHC: 33.1 g/dL (ref 31.5–35.7)
MCV: 83 fL (ref 79–97)
Monocytes Absolute: 0.7 10*3/uL (ref 0.1–0.9)
Monocytes: 6 %
Neutrophils Absolute: 7.3 10*3/uL — ABNORMAL HIGH (ref 1.4–7.0)
Neutrophils: 56 %
Platelets: 347 10*3/uL (ref 150–450)
RBC: 5.3 x10E6/uL (ref 4.14–5.80)
RDW: 13.4 % (ref 11.6–15.4)
WBC: 12.7 10*3/uL — ABNORMAL HIGH (ref 3.4–10.8)

## 2018-06-08 LAB — LIPID PANEL WITH LDL/HDL RATIO
Cholesterol, Total: 175 mg/dL (ref 100–199)
HDL: 44 mg/dL (ref >39)
LDL Calculated: 107 mg/dL — ABNORMAL HIGH (ref 0–99)
LDl/HDL Ratio: 2.4 ratio (ref 0.0–3.6)
Triglycerides: 119 mg/dL (ref 0–149)
VLDL Cholesterol Cal: 24 mg/dL (ref 5–40)

## 2018-06-08 LAB — TSH: TSH: 1.45 u[IU]/mL (ref 0.450–4.500)

## 2018-06-08 LAB — VITAMIN D 25 HYDROXY (VIT D DEFICIENCY, FRACTURES): Vit D, 25-Hydroxy: 26.1 ng/mL — ABNORMAL LOW (ref 30.0–100.0)

## 2018-06-08 LAB — FERRITIN: Ferritin: 227 ng/mL (ref 30–400)

## 2018-06-08 LAB — T4, FREE: Free T4: 1.62 ng/dL (ref 0.82–1.77)

## 2018-06-08 LAB — PSA: Prostate Specific Ag, Serum: 0.2 ng/mL (ref 0.0–4.0)

## 2018-06-15 ENCOUNTER — Encounter: Payer: Self-pay | Admitting: Adult Health

## 2018-06-19 ENCOUNTER — Other Ambulatory Visit: Payer: Self-pay

## 2018-06-19 ENCOUNTER — Ambulatory Visit
Admission: RE | Admit: 2018-06-19 | Discharge: 2018-06-19 | Disposition: A | Discharge status: Home / Self Care | Payer: BLUE CROSS/BLUE SHIELD | Source: Ambulatory Visit | Attending: Adult Health | Admitting: Adult Health

## 2018-06-19 DIAGNOSIS — R519 Headache, unspecified: Secondary | ICD-10-CM

## 2018-06-19 DIAGNOSIS — R51 Headache: Principal | ICD-10-CM

## 2018-06-21 ENCOUNTER — Ambulatory Visit: Payer: BLUE CROSS/BLUE SHIELD | Admitting: Nurse Practitioner

## 2018-06-21 ENCOUNTER — Other Ambulatory Visit: Payer: Self-pay

## 2018-06-26 ENCOUNTER — Encounter: Payer: Self-pay | Admitting: Nurse Practitioner

## 2018-06-26 ENCOUNTER — Ambulatory Visit: Payer: BLUE CROSS/BLUE SHIELD | Admitting: Nurse Practitioner

## 2018-06-26 ENCOUNTER — Other Ambulatory Visit: Payer: Self-pay

## 2018-06-26 VITALS — BP 124/88 | HR 88 | Ht 69.0 in | Wt 257.0 lb

## 2018-06-26 DIAGNOSIS — R51 Headache: Secondary | ICD-10-CM

## 2018-06-26 DIAGNOSIS — K219 Gastro-esophageal reflux disease without esophagitis: Secondary | ICD-10-CM

## 2018-06-26 DIAGNOSIS — G8929 Other chronic pain: Secondary | ICD-10-CM

## 2018-06-26 DIAGNOSIS — I10 Essential (primary) hypertension: Secondary | ICD-10-CM | POA: Diagnosis not present

## 2018-06-26 MED ORDER — DEXLANSOPRAZOLE 60 MG PO CPDR: 60.0000 mg | DELAYED_RELEASE_CAPSULE | Freq: Every day | ORAL | 3 refills | Status: DC

## 2018-06-26 NOTE — Progress Notes (Signed)
[facility] [place], [place] [address]  Internal MEDICINE  Telephone Visit  Patient Name: Manuel Cain  [id number]  [id number]  Date of Service: 07/15/2018  I connected with the patient at 2:39pm by webcam and verified the patients identity using two identifiers.   I discussed the limitations, risks, security and privacy concerns of performing an evaluation and management service by webcam and the availability of in person appointments. I also discussed with the patient that there may be a patient responsible charge related to the service.  The patient expressed understanding and agrees to proceed.    Chief Complaint  Patient presents with  . Telephone Screen    VIDEO VISIT [phone]  . Telephone Assessment  . Medical Management of Chronic Issues    6wk follow up,   . Labs Only    lab results, pt was unable to get MRI   . Cough    cough that rattles in the throat Bronchial area, since Feb 20th, no fever Feb 17th spouseand pt got really since severe for a couple of days, once or twice an hour, nagging cough, no bodyaches, no chills, when breathing in or laughing feels some extreme burning in chest if he inhales and even when breathing normally   . Sore Throat  . Hypertension    pt concerned that BP goes up to 160/98-99 feels horrible, today has been the lowest it have been in a long time.   . Pain    twisted knee wondering if he needs a referral  . Anxiety    struggling with anxiety and going going through shakes and gitters all the time, have it when calm, relaxed, heart rate is elevated when settling down to slep, had these issues started 2wks ago    The patient has been contacted via webcam for follow up visit due to concerns for spread of novel coronavirus. The patient the patient had recent visit to ER due to severe panic attack and had some tightness in his chest. States that he was prescribed hydroxyzine to take as needed, hwever, he has not  taken this at all. Does not want to take medication if not needed. Is keeping this on hand just in case. He continues to have moderate anxiety. Feels jittery and has chest tightness. Will make it difficult for him to fall asleep but once asleep he is able to sleep well. He wakes up feeling immediately short of breat and very jittery. Once he gets going and gets busy, his anxiety improves significantly.  He is also having burning in the throat, especially when he breathes in. When he feels this burning, causes him to cough. Starts in the sternum and gradually works his way up the chest and throat. He is not able to cough anything up.  The patient also states that he was scheduled to have MRI of the brain on 06/19/2018. States that he was unable to do the imaging study. States that he has severe claustrophobia. Thought he could get through the imaging with deep breathing and visualization exercises, but he was ultimately unable to do this. He states that in order for him to get the study done, he will likely have to have complete sedation.       Current Medication: Outpatient Encounter Medications as of 06/26/2018  Medication Sig Note  . albuterol (PROVENTIL HFA;VENTOLIN HFA) 108 (90 Base) MCG/ACT inhaler Inhale 2 puffs into the lungs every 4 (four) hours as needed for shortness of breath (  or chest tightness).   . hydrochlorothiazide (HYDRODIURIL) 12.5 MG tablet Take 1 tablet (12.5 mg total) by mouth daily.   . metoprolol tartrate (LOPRESSOR) 25 MG tablet Take 1 tablet (25 mg total) by mouth 2 (two) times daily.   . Multiple Vitamins-Minerals (CENTRUM SILVER 50+MEN PO) Take by mouth.   . [DISCONTINUED] pantoprazole (PROTONIX) 40 MG tablet Take 1 tablet (40 mg total) by mouth daily. 06/26/2018: pantoprazole not helping GERD  . dexlansoprazole (DEXILANT) 60 MG capsule Take 1 capsule (60 mg total) by mouth daily.   . hydrOXYzine (ATARAX/VISTARIL) 50 MG tablet Take 1 tablet (50 mg total) by mouth every 8  (eight) hours as needed for anxiety. (Patient not taking: Reported on 06/26/2018)   . predniSONE (STERAPRED UNI-PAK 21 TAB) 10 MG (21) TBPK tablet 6 day taper - take by mouth as directed for 6 days (Patient not taking: Reported on 06/05/2018)   . vitamin B-12 (CYANOCOBALAMIN) 500 MCG tablet Take 500 mcg by mouth daily.    No facility-administered encounter medications on file as of 06/26/2018.     Surgical History: Past Surgical History:  Procedure Laterality Date  . COLONOSCOPY WITH PROPOFOL N/A 12/16/2016   Procedure: COLONOSCOPY WITH PROPOFOL;  Surgeon: Lucilla Lame, MD;  Location: [place];  Service: Endoscopy;  Laterality: N/A;  . ESOPHAGOGASTRODUODENOSCOPY (EGD) WITH PROPOFOL N/A 12/16/2016   Procedure: ESOPHAGOGASTRODUODENOSCOPY (EGD) WITH PROPOFOL;  Surgeon: Lucilla Lame, MD;  Location: [place];  Service: Endoscopy;  Laterality: N/A;  . KNEE SURGERY Left   . POLYPECTOMY  12/16/2016   Procedure: POLYPECTOMY;  Surgeon: Lucilla Lame, MD;  Location: [place];  Service: Endoscopy;;  . vastectomy  1999    Medical History: Past Medical History:  Diagnosis Date  . Blood in stool    dark pebble like per pt  x4 wks  . Chest pain    on going per pt-x 40mo  . Chronic ankle pain    Right, s/p HYQ(6578)  . Chronic hip pain    right, s/p ION(6295)  . Curvature of spine   . Dyspnea   . Frequent nosebleeds    x 2 mo per pt (march 2018)  . GERD (gastroesophageal reflux disease)   . Head injury 04/2016      . Hemorrhoids   . Hypertension   . Memory changes    x 1 yr-since 2017  . Migraine    since 2013    Family History: Family History  Problem Relation Age of Onset  . Hypertension Mother   . Breast cancer Mother   . CAD Father   . Heart attack Father   . Bladder Cancer Brother     Social History   Socioeconomic History  . Marital status: Married    Spouse name: Not on file  . Number of children: Not on file  . Years of education: Not on  file  . Highest education level: Not on file  Occupational History  . Not on file  Social Needs  . Financial resource strain: Not on file  . Food insecurity:    Worry: Not on file    Inability: Not on file  . Transportation needs:    Medical: Not on file    Non-medical: Not on file  Tobacco Use  . Smoking status: Never Smoker  . Smokeless tobacco: Never Used  Substance and Sexual Activity  . Alcohol use: No  . Drug use: No  . Sexual activity: Yes  Lifestyle  . Physical activity:  Days per week: Not on file    Minutes per session: Not on file  . Stress: Not on file  Relationships  . Social connections:    Talks on phone: Not on file    Gets together: Not on file    Attends religious service: Not on file    Active member of club or organization: Not on file    Attends meetings of clubs or organizations: Not on file    Relationship status: Not on file  . Intimate partner violence:    Fear of current or ex partner: Not on file    Emotionally abused: Not on file    Physically abused: Not on file    Forced sexual activity: Not on file  Other Topics Concern  . Not on file  Social History Narrative  . Not on file      Review of Systems  Constitutional: Positive for fatigue. Negative for chills and unexpected weight change.  HENT: Positive for postnasal drip and sore throat. Negative for congestion, rhinorrhea and sneezing.   Respiratory: Positive for cough and shortness of breath. Negative for chest tightness.   Cardiovascular: Positive for palpitations. Negative for chest pain.  Gastrointestinal: Negative for abdominal pain, constipation, diarrhea, nausea and vomiting.       GERD with heartburn type symptoms.   Endocrine: Negative for cold intolerance, heat intolerance, polydipsia and polyuria.  Musculoskeletal: Negative for arthralgias, back pain, joint swelling and neck pain.  Skin: Negative.  Negative for rash.  Neurological: Positive for dizziness and headaches.  Negative for tremors and numbness.  Hematological: Negative for adenopathy. Does not bruise/bleed easily.  Psychiatric/Behavioral: Negative for behavioral problems, sleep disturbance and suicidal ideas. The patient is nervous/anxious.        Claustrophobic related panic attack when having MRI. Unable to complete study due to level of anxiety.    Today's Vitals   06/26/18 1417  BP: 124/88  Pulse: 88  Weight: 257 lb (116.6 kg)  Height: 5\' 9"  (1.753 m)   Body mass index is 37.95 kg/m.  Observation/Objective:  The patient is alert and oriented. He is pleasant and answering all questions appropriately. Breathing is non-labored. He is in no acute distress.    Assessment/Plan: 1. Gastroesophageal reflux disease without esophagitis Add dexilant 60mg  daily. Advised him to use OTC pepcid or tums as needed and as indicated for more severe heartburn type symptoms . - dexlansoprazole (DEXILANT) 60 MG capsule; Take 1 capsule (60 mg total) by mouth daily.  Dispense: 30 capsule; Refill: 3  2. Essential hypertension Improved. Continue bp medication as prescribed   3. Chronic nonintractable headache, unspecified headache type Will have to discuss plan for MRI with sedation. May require referral to neurology.   General Counseling: dima ferrufino understanding of the findings of today's phone visit and agrees with plan of treatment. I have discussed any further diagnostic evaluation that may be needed or ordered today. We also reviewed his medications today. he has been encouraged to call the office with any questions or concerns that should arise related to todays visit.  Hypertension Counseling:   The following hypertensive lifestyle modification were recommended and discussed:  1. Limiting alcohol intake to less than 1 oz/day of ethanol:(24 oz of beer or 8 oz of wine or 2 oz of 100-proof whiskey). 2. Take baby ASA 81 mg daily. 3. Importance of regular aerobic exercise and losing  weight. 4. Reduce dietary saturated fat and cholesterol intake for overall cardiovascular health. 5. Maintaining adequate dietary potassium,  calcium, and magnesium intake. 6. Regular monitoring of the blood pressure. 7. Reduce sodium intake to less than 100 mmol/day (less than 2.3 gm of sodium or less than 6 gm of sodium choride)   This patient was seen by Marcus Hook with Dr Lavera Guise as a part of collaborative care agreement  Meds ordered this encounter  Medications  . dexlansoprazole (DEXILANT) 60 MG capsule    Sig: Take 1 capsule (60 mg total) by mouth daily.    Dispense:  30 capsule    Refill:  3    Patient is having symptoms of GERD even with pantoprazole and pepcid twice daily. Was doing well on dexilant in the past and needs to go back on this medicaiton.    Order Specific Question:   Supervising Provider    Answer:   Lavera Guise [[id number]]    Time spent: 46 Minutes    Dr Lavera Guise Internal medicine

## 2018-06-30 ENCOUNTER — Telehealth: Payer: Self-pay

## 2018-06-30 ENCOUNTER — Telehealth: Payer: Self-pay | Admitting: Nurse Practitioner

## 2018-06-30 NOTE — Telephone Encounter (Signed)
Prior auth for  Approvedtoday Effective from 06/30/2018 through 06/28/2021. Dexilant 60MG  dr capsule Pharmacy and patient notified

## 2018-06-30 NOTE — Telephone Encounter (Signed)
Send message to [facility] for referral

## 2018-06-30 NOTE — Telephone Encounter (Signed)
Pt advised we get approved from insurance for dexillant

## 2018-07-05 ENCOUNTER — Other Ambulatory Visit: Payer: Self-pay | Admitting: Gastroenterology

## 2018-07-05 DIAGNOSIS — R112 Nausea with vomiting, unspecified: Secondary | ICD-10-CM

## 2018-07-07 ENCOUNTER — Ambulatory Visit (INDEPENDENT_AMBULATORY_CARE_PROVIDER_SITE_OTHER): Payer: BLUE CROSS/BLUE SHIELD | Admitting: Licensed Clinical Social Worker

## 2018-07-07 DIAGNOSIS — F411 Generalized anxiety disorder: Secondary | ICD-10-CM | POA: Diagnosis not present

## 2018-07-07 NOTE — Progress Notes (Signed)
Comprehensive Clinical Assessment (CCA) Note Virtual Visit via Video Note  I connected with Manuel Cain on 07/07/18 at  9:30 AM EDT by a video enabled telemedicine application and verified that I am speaking with the correct person using two identifiers.   I discussed the limitations of evaluation and management by telemedicine and the availability of in person appointments. The patient expressed understanding and agreed to proceed.   I discussed the assessment and treatment plan with the patient. The patient was provided an opportunity to ask questions and all were answered. The patient agreed with the plan and demonstrated an understanding of the instructions.   The patient was advised to call back or seek an in-person evaluation if the symptoms worsen or if the condition fails to improve as anticipated.  I provided 58 minutes of non-face-to-face time during this encounter.   Manuel Fragmin, LCSW    07/07/2018 Manuel Cain [id number]  Visit Diagnosis:      ICD-10-CM   1. Generalized anxiety disorder F41.1       CCA Part One  Part One has been completed on paper by the patient.  (See scanned document in Chart Review)  CCA Part Two A  Intake/Chief Complaint:    constant anxiety, can't sleep, panic attacks and worried about having more; occasional anxiety attacks since 2 years but much more extreme over the past month or 2 (relates this to COVID)  Mental Health Symptoms Depression:   not sleeping or eating, little interest in activities, low energy  Mania:     Anxiety:    intrusive worry thoughts, obsessive thinking, panic attacks, trembling, legs feel weak,struggles when he is not in control of situation (I.e. passenger in a car)  Psychosis:     Trauma:     Obsessions:   associated with COVID19 and having bronchitis currently  Compulsions:     Inattention:     Hyperactivity/Impulsivity:     Oppositional/Defiant Behaviors:     Borderline Personality:      Other Mood/Personality Symptoms:       Family and Psychosocial History: Married, has 3 children (35, 21, 86 years old), youngest daughter lives in the home with him and wife.  Has 6 siblings: 5 brothers and a sister.They are not as close as he would like. Has some extreme problems with 2 brothers. One brother has a lot of legal problems, has stolen from him and been violent toward parents. Close with parents. There have been some times that parents have had problems and dad has left, patient has been the one to track him down (once in [place]) and bring him back. His siblings have not been helpful.   Childhood History:  When was a teenager parents began having problems, dad was not faithful and there were "wars in the house". Mom attempted suicide at one point. Parents are still living, still married and doing well.     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 Works in [employer],  and usually enjoys it  Education:  2 year college degree  Religion:  [place] himself a Airline pilot, relies on spirituality and spiritual principles in much of his life. Active in church community. 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 Struggling with his desire and motivation for that. Currently feeling distanced from God, and believes that this is part of why he feels less peace. Enjoys reading the Bible and usually draws  strength from it.  Leisure/Recreation:  Church activities, reading the Bible, attending sporting events with friends   CCA Part Two C  No substance abuse  CCA Part Three  ASAM's:  Six Dimensions of Multidimensional Assessment  Dimension 1:  Acute Intoxication and/or Withdrawal Potential:   None  Dimension 2:  Biomedical Conditions and Complications:   Ulcer, History of multiple concussions  Dimension 3:  Emotional, Behavioral, or Cognitive Conditions and Complications:   Underlying  anxiety  Dimension 4:  Readiness to Change:   Preparation Stage  Dimension 5:  Relapse, Continued use, or Continued Problem Potential:   N/a  Dimension 6:  Recovery/Living Environment:   Positive environment   Substance use Disorder (SUD) No substance abuse  Social Function:    Isolative  Stress:    COVID 19, medical conditions (has bronchitis currently)  Risk Assessment- Self-Harm Potential:  No risk  Risk Assessment -Dangerous to Others Potential:  No risk  DSM5 Diagnoses: Patient Active Problem List   Diagnosis Date Noted  . Acute upper respiratory infection 05/25/2018  . Cough 05/25/2018  . B12 deficiency 11/04/2017  . Idiopathic peripheral neuropathy 11/03/2017  . Sexual dysfunction 04/18/2017  . Situational anxiety 04/18/2017  . Hematochezia   . Benign neoplasm of transverse colon   . Rectal polyp   . Gastroesophageal reflux disease   . Vaccine counseling 06/10/2016  . Essential hypertension 10/25/2015  . Morbid obesity with BMI of 40.0-44.9, adult ([place]) 10/25/2015  . Leukocytosis 10/25/2015  . Chest pain, unspecified 10/24/2015    Patient Centered Plan: Patient is on the following Treatment Plan(s):  Anxiety  Recommendations for Services/Supports/Treatments:  Individual Outpatient Therapy   Manuel Cain

## 2018-07-09 IMAGING — US ULTRASOUND ABDOMEN LIMITED
1 series · 14 of 25 positions shown · non-contrast
Comparison: Abdominal ultrasound [DATE].

CLINICAL DATA: 47-year-old male with history of nausea and
epigastric pain for the past 3 days.

EXAM:
ULTRASOUND ABDOMEN LIMITED RIGHT UPPER QUADRANT

[Series 1: ultrasound abdomen limited · 0.23mm/px · 14 of 45 slices shown]
[im 1/45]
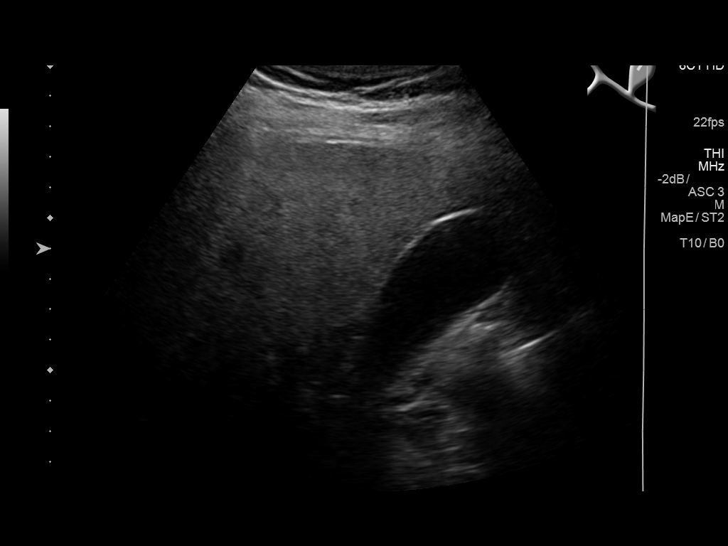
[im 4/45]
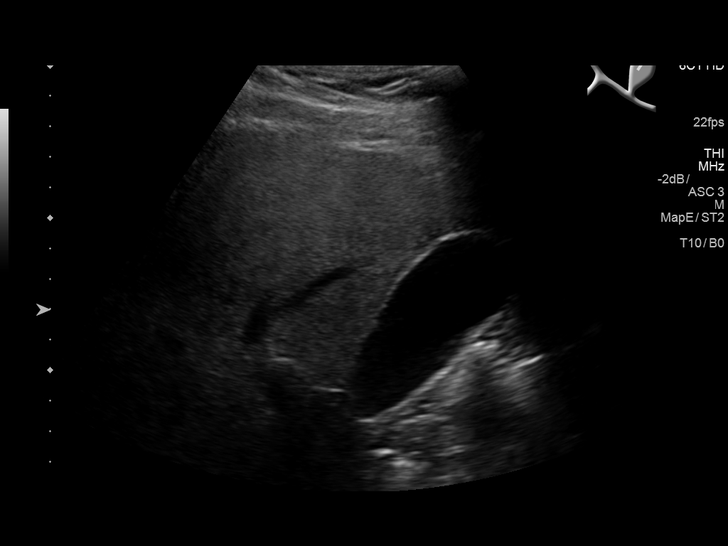
[im 8/45]
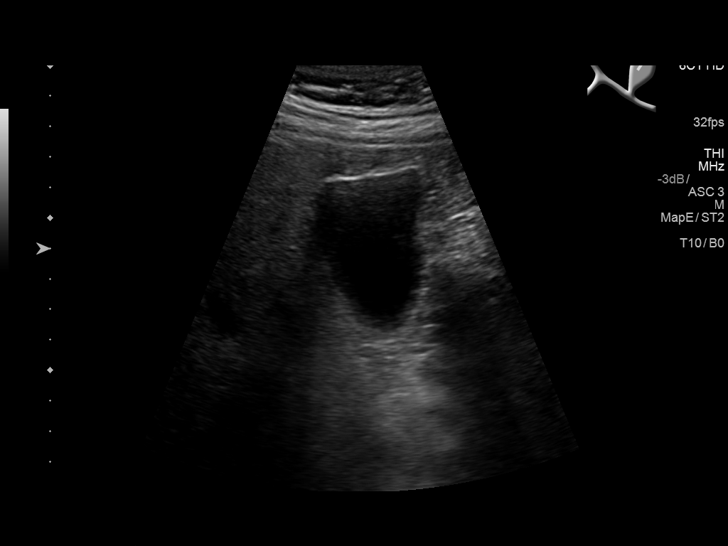
[im 12/45]
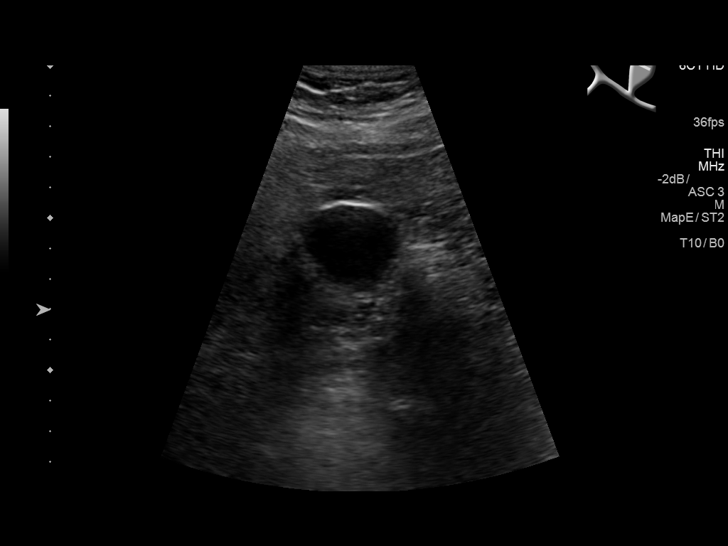
[im 15/45]
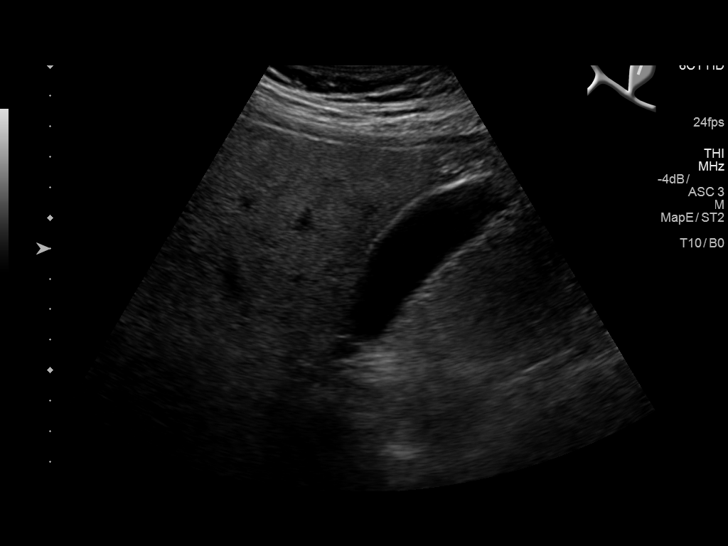
[im 17/45]
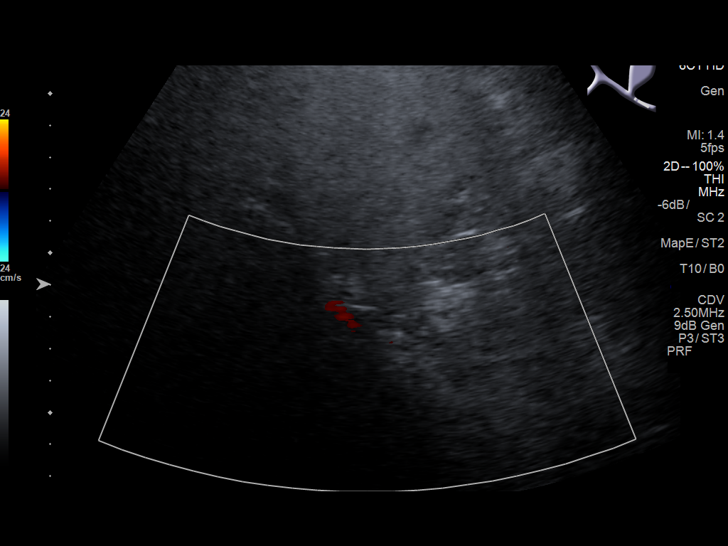
[im 21/45]
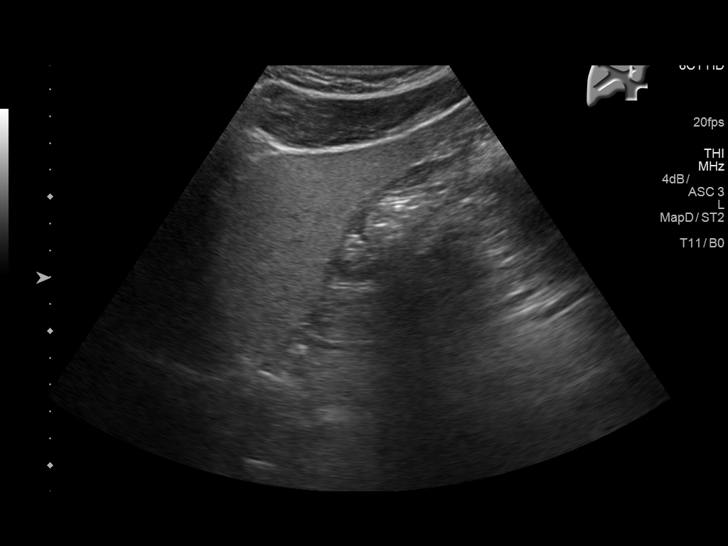
[im 24/45]
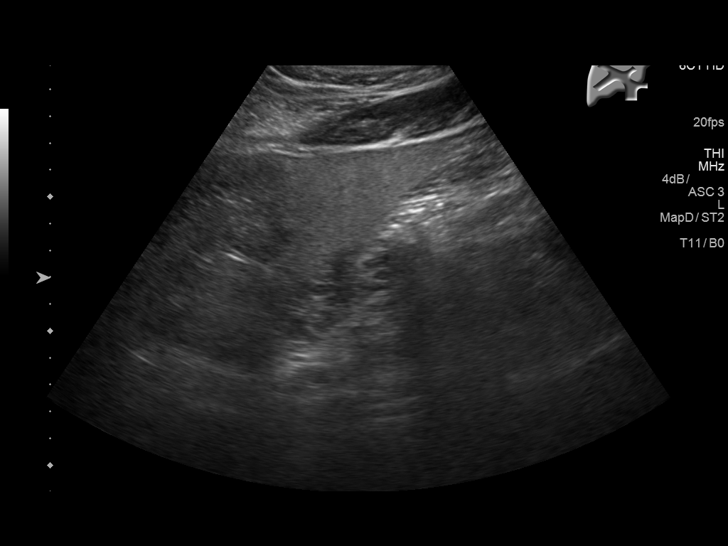
[im 28/45]
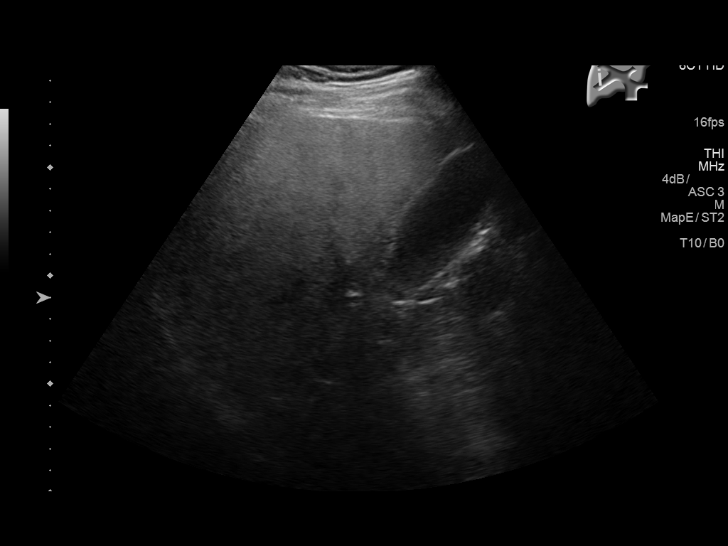
[im 30/45]
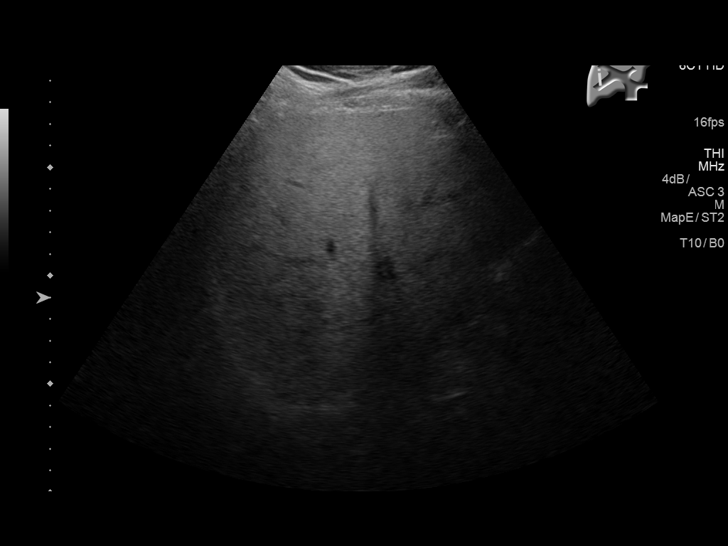
[im 34/45]
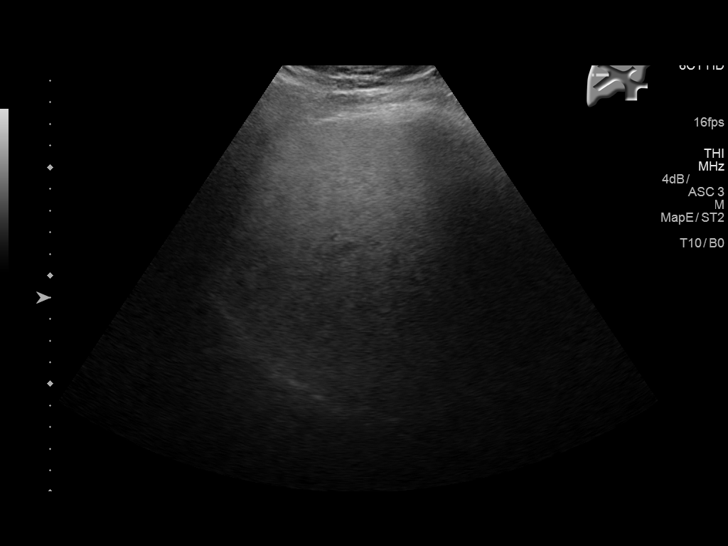
[im 37/45]
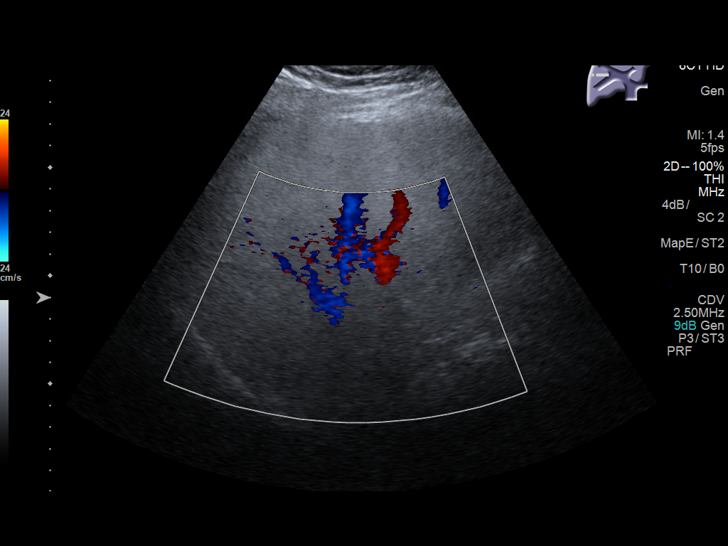
[im 41/45]
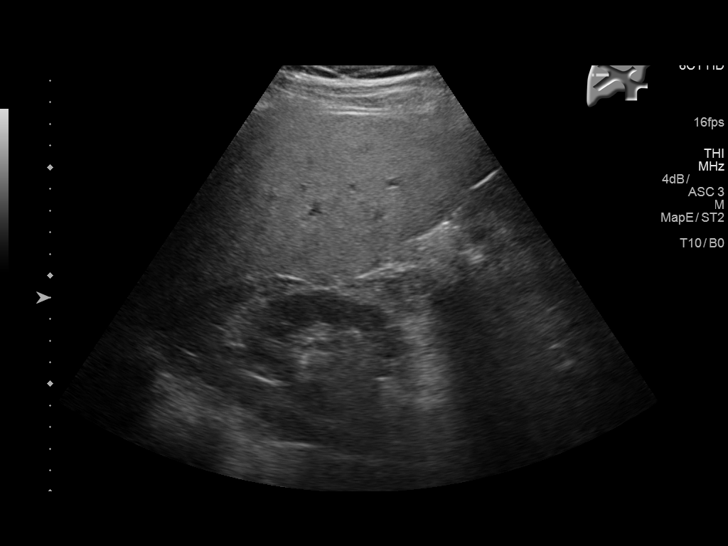
[im 45/45]
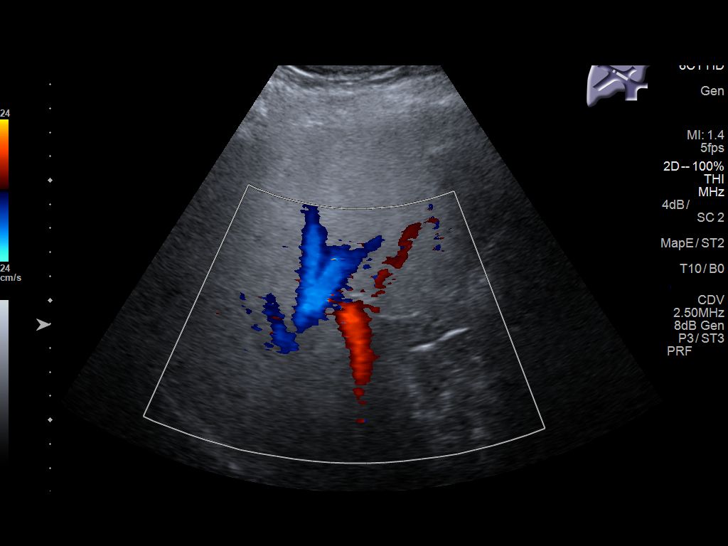

[14 of 25 positions shown; findings below may reference images not displayed]

FINDINGS: Gallbladder:

No gallstones or wall thickening visualized. No sonographic Murphy
sign noted by sonographer.

Common bile duct:

Diameter: 4.9 mm

Liver:

No focal lesion identified. Liver contour is slightly irregular.
Diffusely increased hepatic echogenicity, indicative of hepatic
steatosis. Portal vein is patent on color Doppler imaging with
normal direction of blood flow towards the liver.
IMPRESSION: 1. No acute findings. Specifically, no gallstones or findings to
suggest an acute cholecystitis.
2. Hepatic steatosis redemonstrated. Today's study also demonstrates
subtle nodular contour of the liver, which could suggest developing
cirrhosis.

## 2018-07-15 DIAGNOSIS — R519 Headache, unspecified: Secondary | ICD-10-CM | POA: Insufficient documentation

## 2018-07-20 ENCOUNTER — Ambulatory Visit
Admission: RE | Admit: 2018-07-20 | Discharge: 2018-07-20 | Disposition: A | Discharge status: Home / Self Care | Payer: BLUE CROSS/BLUE SHIELD | Source: Ambulatory Visit | Attending: Gastroenterology | Admitting: Gastroenterology

## 2018-07-20 ENCOUNTER — Other Ambulatory Visit: Payer: Self-pay | Admitting: Gastroenterology

## 2018-07-20 ENCOUNTER — Ambulatory Visit: Payer: BLUE CROSS/BLUE SHIELD

## 2018-07-20 ENCOUNTER — Other Ambulatory Visit: Payer: Self-pay

## 2018-07-20 DIAGNOSIS — R110 Nausea: Secondary | ICD-10-CM | POA: Insufficient documentation

## 2018-07-20 DIAGNOSIS — R1013 Epigastric pain: Secondary | ICD-10-CM | POA: Insufficient documentation

## 2018-07-25 ENCOUNTER — Ambulatory Visit
Admission: RE | Admit: 2018-07-25 | Discharge: 2018-07-25 | Disposition: A | Discharge status: Home / Self Care | Payer: BLUE CROSS/BLUE SHIELD | Source: Ambulatory Visit | Attending: Gastroenterology | Admitting: Gastroenterology

## 2018-07-25 ENCOUNTER — Other Ambulatory Visit: Payer: Self-pay

## 2018-07-25 DIAGNOSIS — R112 Nausea with vomiting, unspecified: Secondary | ICD-10-CM | POA: Insufficient documentation

## 2018-07-26 ENCOUNTER — Other Ambulatory Visit: Payer: Self-pay | Admitting: Gastroenterology

## 2018-07-26 DIAGNOSIS — R112 Nausea with vomiting, unspecified: Secondary | ICD-10-CM

## 2018-08-02 ENCOUNTER — Encounter
Admission: RE | Admit: 2018-08-02 | Discharge: 2018-08-02 | Disposition: A | Discharge status: Home / Self Care | Payer: BLUE CROSS/BLUE SHIELD | Source: Ambulatory Visit | Attending: Gastroenterology | Admitting: Gastroenterology

## 2018-08-02 ENCOUNTER — Other Ambulatory Visit: Payer: Self-pay

## 2018-08-02 DIAGNOSIS — R112 Nausea with vomiting, unspecified: Secondary | ICD-10-CM | POA: Diagnosis not present

## 2018-08-02 IMAGING — NM NUCLEAR MEDICINE HEPATOBILIARY IMAGING WITH GALLBLADDER EF
3 series · 18 of 18 positions shown · non-contrast
Comparison: None.

CLINICAL DATA: Right upper quadrant pain

EXAM:
NUCLEAR MEDICINE HEPATOBILIARY IMAGING WITH GALLBLADDER EF
VIEWS:
Anterior right upper quadrant
RADIOPHARMACEUTICALS:  5.37 mCi [MC]  Choletec IV

[Series 1000: hepatobiliary scan · 9.59mm/px · 6 of 60 frames shown]
[frame 6/60]
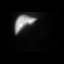
[frame 16/60]
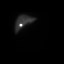
[frame 26/60]
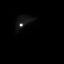
[frame 36/60]
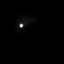
[frame 46/60]
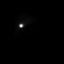
[frame 56/60]
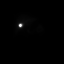

[Series 1000: hepato ef acq · 4.80mm/px · 6 of 120 frames shown]
[frame 11/120]
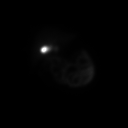
[frame 31/120]
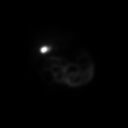
[frame 51/120]
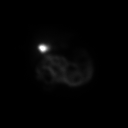
[frame 71/120]
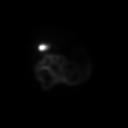
[frame 91/120]
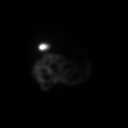
[frame 111/120]
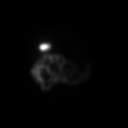

[Series 1000: hepato ef acq (results) · 4.80mm/px · 6 of 120 frames shown]
[frame 11/120]
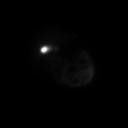
[frame 31/120]
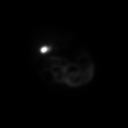
[frame 51/120]
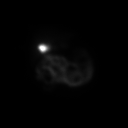
[frame 71/120]
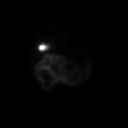
[frame 91/120]
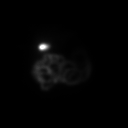
[frame 111/120]
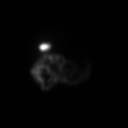

[18 of 18 positions shown; findings below may reference images not displayed]

FINDINGS: Liver uptake of radiotracer is unremarkable.) There is prompt
visualization of gallbladder and small bowel, indicating patency of
the cystic and common bile ducts. The patient consumed 8 ounces of
Ensure orally with calculation of the computer generated ejection
fraction of radiotracer from the gallbladder. The patient
experienced moderate abdominal pain with the oral Ensure
consumption. The computer generated ejection fraction of radiotracer
from the gallbladder is diminished at 22%, normal greater than 33%
using the oral agent.
IMPRESSION: Diminished ejection fraction of radiotracer from the gallbladder, a
finding indicative of a degree of biliary dyskinesia. Patient
experienced pain with the oral Ensure consumption. Cystic and common
bile ducts are patent as is evidenced by visualization of
gallbladder and small bowel.

## 2018-08-02 MED ORDER — TECHNETIUM TC 99M MEBROFENIN IV KIT
5.0000 | PACK | Freq: Once | INTRAVENOUS | Status: AC | PRN
  Administered 2018-08-02: 5.37 via INTRAVENOUS

## 2018-08-04 ENCOUNTER — Other Ambulatory Visit: Payer: Self-pay

## 2018-08-04 ENCOUNTER — Other Ambulatory Visit
Admission: RE | Admit: 2018-08-04 | Discharge: 2018-08-04 | Disposition: A | Discharge status: Home / Self Care | Payer: BLUE CROSS/BLUE SHIELD | Source: Ambulatory Visit | Attending: Internal Medicine | Admitting: Internal Medicine

## 2018-08-04 DIAGNOSIS — Z01812 Encounter for preprocedural laboratory examination: Secondary | ICD-10-CM | POA: Insufficient documentation

## 2018-08-04 DIAGNOSIS — Z1159 Encounter for screening for other viral diseases: Secondary | ICD-10-CM | POA: Insufficient documentation

## 2018-08-05 LAB — NOVEL CORONAVIRUS, NAA (HOSP ORDER, SEND-OUT TO REF LAB; TAT 18-24 HRS): SARS-CoV-2, NAA: NOT DETECTED

## 2018-08-07 ENCOUNTER — Other Ambulatory Visit: Payer: Self-pay | Admitting: Adult Health

## 2018-08-07 ENCOUNTER — Telehealth: Payer: Self-pay

## 2018-08-07 NOTE — Telephone Encounter (Signed)
Can u double check if he is taking this meds also Beth he will need a follow up app

## 2018-08-07 NOTE — Telephone Encounter (Signed)
lmom to call us back

## 2018-08-08 ENCOUNTER — Encounter: Payer: Self-pay | Admitting: *Deleted

## 2018-08-09 ENCOUNTER — Ambulatory Visit: Payer: BLUE CROSS/BLUE SHIELD | Admitting: Anesthesiology

## 2018-08-09 ENCOUNTER — Encounter
Admission: RE | Disposition: A | Discharge status: Home / Self Care | Payer: Self-pay | Source: Home / Self Care | Attending: Internal Medicine

## 2018-08-09 ENCOUNTER — Ambulatory Visit
Admission: RE | Admit: 2018-08-09 | Discharge: 2018-08-09 | Disposition: A | Discharge status: Home / Self Care | Payer: BLUE CROSS/BLUE SHIELD | Attending: Internal Medicine | Admitting: Internal Medicine

## 2018-08-09 ENCOUNTER — Other Ambulatory Visit: Payer: Self-pay

## 2018-08-09 DIAGNOSIS — M25559 Pain in unspecified hip: Secondary | ICD-10-CM | POA: Diagnosis not present

## 2018-08-09 DIAGNOSIS — K219 Gastro-esophageal reflux disease without esophagitis: Secondary | ICD-10-CM | POA: Insufficient documentation

## 2018-08-09 DIAGNOSIS — F419 Anxiety disorder, unspecified: Secondary | ICD-10-CM | POA: Insufficient documentation

## 2018-08-09 DIAGNOSIS — G8929 Other chronic pain: Secondary | ICD-10-CM | POA: Diagnosis not present

## 2018-08-09 DIAGNOSIS — Z7951 Long term (current) use of inhaled steroids: Secondary | ICD-10-CM | POA: Insufficient documentation

## 2018-08-09 DIAGNOSIS — K760 Fatty (change of) liver, not elsewhere classified: Secondary | ICD-10-CM | POA: Diagnosis not present

## 2018-08-09 DIAGNOSIS — Z801 Family history of malignant neoplasm of trachea, bronchus and lung: Secondary | ICD-10-CM | POA: Diagnosis not present

## 2018-08-09 DIAGNOSIS — Z8249 Family history of ischemic heart disease and other diseases of the circulatory system: Secondary | ICD-10-CM | POA: Insufficient documentation

## 2018-08-09 DIAGNOSIS — R55 Syncope and collapse: Secondary | ICD-10-CM | POA: Insufficient documentation

## 2018-08-09 DIAGNOSIS — D72829 Elevated white blood cell count, unspecified: Secondary | ICD-10-CM | POA: Diagnosis not present

## 2018-08-09 DIAGNOSIS — Z8379 Family history of other diseases of the digestive system: Secondary | ICD-10-CM | POA: Diagnosis not present

## 2018-08-09 DIAGNOSIS — K2970 Gastritis, unspecified, without bleeding: Secondary | ICD-10-CM | POA: Insufficient documentation

## 2018-08-09 DIAGNOSIS — M25579 Pain in unspecified ankle and joints of unspecified foot: Secondary | ICD-10-CM | POA: Diagnosis not present

## 2018-08-09 DIAGNOSIS — Z8261 Family history of arthritis: Secondary | ICD-10-CM | POA: Diagnosis not present

## 2018-08-09 DIAGNOSIS — Z79899 Other long term (current) drug therapy: Secondary | ICD-10-CM | POA: Diagnosis not present

## 2018-08-09 DIAGNOSIS — Z6836 Body mass index (BMI) 36.0-36.9, adult: Secondary | ICD-10-CM | POA: Insufficient documentation

## 2018-08-09 DIAGNOSIS — I10 Essential (primary) hypertension: Secondary | ICD-10-CM | POA: Insufficient documentation

## 2018-08-09 DIAGNOSIS — R1310 Dysphagia, unspecified: Secondary | ICD-10-CM | POA: Diagnosis present

## 2018-08-09 DIAGNOSIS — K222 Esophageal obstruction: Secondary | ICD-10-CM | POA: Diagnosis not present

## 2018-08-09 DIAGNOSIS — E669 Obesity, unspecified: Secondary | ICD-10-CM | POA: Diagnosis not present

## 2018-08-09 DIAGNOSIS — K317 Polyp of stomach and duodenum: Secondary | ICD-10-CM | POA: Insufficient documentation

## 2018-08-09 HISTORY — DX: Anxiety disorder, unspecified: F41.9

## 2018-08-09 HISTORY — DX: Syncope and collapse: R55

## 2018-08-09 HISTORY — PX: ESOPHAGOGASTRODUODENOSCOPY (EGD) WITH PROPOFOL: SHX5813

## 2018-08-09 SURGERY — ESOPHAGOGASTRODUODENOSCOPY (EGD) WITH PROPOFOL
Anesthesia: General

## 2018-08-09 MED ORDER — SODIUM CHLORIDE 0.9 % IV SOLN
INTRAVENOUS | Status: DC
  Administered 2018-08-09: 1000 mL via INTRAVENOUS

## 2018-08-09 MED ORDER — PROPOFOL 10 MG/ML IV BOLUS
INTRAVENOUS | Status: AC
  Filled 2018-08-09: qty 20

## 2018-08-09 MED ORDER — PROPOFOL 500 MG/50ML IV EMUL
INTRAVENOUS | Status: DC | PRN
  Administered 2018-08-09: 160 ug/kg/min via INTRAVENOUS

## 2018-08-09 MED ORDER — PROPOFOL 10 MG/ML IV BOLUS
INTRAVENOUS | Status: DC | PRN
  Administered 2018-08-09: 80 mg via INTRAVENOUS
  Administered 2018-08-09: 120 mg via INTRAVENOUS

## 2018-08-09 MED ORDER — PROPOFOL 500 MG/50ML IV EMUL
INTRAVENOUS | Status: AC
  Filled 2018-08-09: qty 50

## 2018-08-09 NOTE — Anesthesia Post-op Follow-up Note (Signed)
Anesthesia QCDR form completed.        

## 2018-08-09 NOTE — Anesthesia Preprocedure Evaluation (Signed)
Anesthesia Evaluation  Patient identified by MRN, date of birth, ID band Patient awake    Reviewed: Allergy & Precautions, NPO status , Patient's Chart, lab work & pertinent test results  History of Anesthesia Complications Negative for: history of anesthetic complications  Airway Mallampati: III  TM Distance: >3 FB Neck ROM: Full    Dental no notable dental hx.    Pulmonary neg pulmonary ROS, neg sleep apnea, neg COPD,    breath sounds clear to auscultation- rhonchi (-) wheezing      Cardiovascular hypertension, Pt. on medications (-) CAD, (-) Past MI, (-) Cardiac Stents and (-) CABG  Rhythm:Regular Rate:Normal - Systolic murmurs and - Diastolic murmurs    Neuro/Psych  Headaches, neg Seizures Anxiety    GI/Hepatic Neg liver ROS, GERD  ,  Endo/Other  negative endocrine ROSneg diabetes  Renal/GU negative Renal ROS     Musculoskeletal negative musculoskeletal ROS (+)   Abdominal (+) + obese,   Peds  Hematology negative hematology ROS (+)   Anesthesia Other Findings Past Medical History: No date: Anxiety No date: Blood in stool     Comment:  dark pebble like per pt  x4 wks No date: Chest pain     Comment:  on going per pt-x 70mo No date: Chronic ankle pain     Comment:  Right, s/p ZOX(0960) No date: Chronic hip pain     Comment:  right, 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) No date: Curvature of spine No date: Dyspnea No date: Frequent nosebleeds     Comment:  x 2 mo per pt (march 2018) No date: GERD (gastroesophageal reflux disease) 04/2016: Head injury     Comment:    No date: Hemorrhoids No date: Hypertension No date: Memory changes     Comment:  x 1 yr-since 2017 No date: Migraine     Comment:  since 2013 No date: Vasovagal syncope   Reproductive/Obstetrics                             Anesthesia Physical Anesthesia Plan  ASA: II  Anesthesia Plan: General   Post-op Pain Management:     Induction: Intravenous  PONV Risk Score and Plan: 1 and Propofol infusion  Airway Management Planned: Natural Airway  Additional Equipment:   Intra-op Plan:   Post-operative Plan:   Informed Consent: I have reviewed the patients History and Physical, chart, labs and discussed the procedure including the risks, benefits and alternatives for the proposed anesthesia with the patient or authorized representative who has indicated his/her understanding and acceptance.     Dental advisory given  Plan Discussed with: CRNA and Anesthesiologist  Anesthesia Plan Comments:         Anesthesia Quick Evaluation

## 2018-08-09 NOTE — Op Note (Signed)
[facility] Gastroenterology Patient Name: Manuel Cain Procedure Date: 08/09/2018 10:50 AM MRN: [id number] Account #: [id number] Date of Birth: [birth year] Admit Type: Outpatient Age: 48 Room: [facility] ENDO ROOM 4 Gender: Male Note Status: Finalized Procedure:            Upper GI endoscopy Indications:          Dysphagia, Suspected esophageal reflux, Failure to                        respond to medical treatment Providers:            Benay Pike. Alice Reichert MD, MD Referring MD:         Dion Body (Referring MD) Medicines:            Propofol per Anesthesia Complications:        No immediate complications. Procedure:            Pre-Anesthesia Assessment:                       - The risks and benefits of the procedure and the                        sedation options and risks were discussed with the                        patient. All questions were answered and informed                        consent was obtained.                       - Patient identification and proposed procedure were                        verified prior to the procedure by the nurse. The                        procedure was verified in the procedure room.                       - ASA Grade Assessment: III - A patient with severe                        systemic disease.                       - After reviewing the risks and benefits, the patient                        was deemed in satisfactory condition to undergo the                        procedure.                       After obtaining informed consent, the endoscope was                        passed under direct vision. Throughout the procedure,  the patient's blood pressure, pulse, and oxygen                        saturations were monitored continuously. The Endoscope                        was introduced through the mouth, and advanced to the                        third part of duodenum. The upper GI endoscopy was                         accomplished without difficulty. The patient tolerated                        the procedure well. Findings:      Mucosal changes including ringed esophagus and longitudinal markings       were found in the entire esophagus. Esophageal findings were graded       using the Eosinophilic Esophagitis Endoscopic Reference Score       (EoE-EREFS) as: Edema Grade 0 Normal (distinct vascular markings), Rings       Grade 1 Mild (subtle circumferential ridges seen on esophageal       distension), Exudates Grade 0 None (no white lesions seen), Furrows       Grade 1 Present (vertical lines with or without visible depth) and       Stricture present. Biopsies were obtained from the proximal and distal       esophagus with cold forceps for histology of suspected eosinophilic       esophagitis.      One benign-appearing, intrinsic mild stenosis was found in the distal       esophagus. This stenosis measured 1.5 cm (inner diameter) x less than       one cm (in length). The stenosis was traversed. The scope was withdrawn.       Dilation was performed with a Maloney dilator with no resistance at 54       Fr.      Multiple 7 to 10 mm pedunculated and sessile polyps with no bleeding and       no stigmata of recent bleeding were found in the gastric body. Biopsies       were taken with a cold forceps for histology.      The cardia and gastric fundus were normal on retroflexion.      The examined duodenum was normal. Impression:           - Esophageal mucosal changes suggestive of eosinophilic                        esophagitis. Biopsied.                       - Benign-appearing esophageal stenosis. Dilated.                       - Multiple gastric polyps. Biopsied.                       - Normal examined duodenum. Recommendation:       - Patient has a contact number available for  emergencies. The signs and symptoms of potential                        delayed  complications were discussed with the patient.                        Return to normal activities tomorrow. Written discharge                        instructions were provided to the patient.                       - Resume previous diet.                       - Continue present medications.                       - Await pathology results.                       - Return to physician assistant in 6 weeks.                       - The findings and recommendations were discussed with                        the patient. Procedure Code(s):    --- Professional ---                       [phone], Esophagogastroduodenoscopy, flexible, transoral;                        with biopsy, single or multiple                       43450, Dilation of esophagus, by unguided sound or                        bougie, single or multiple passes Diagnosis Code(s):    --- Professional ---                       R13.10, Dysphagia, unspecified                       K31.7, Polyp of stomach and duodenum                       K22.2, Esophageal obstruction                       K22.8, Other specified diseases of esophagus CPT copyright 2019 American Medical Association. All rights reserved. The codes documented in this report are preliminary and upon coder review may  be revised to meet current compliance requirements. Efrain Sella MD, MD 08/09/2018 11:10:07 AM This report has been signed electronically. Number of Addenda: 0 Note Initiated On: 08/09/2018 10:50 AM      [facility]

## 2018-08-09 NOTE — Anesthesia Postprocedure Evaluation (Signed)
Anesthesia Post Note  Patient: Manuel Cain  Procedure(s) Performed: ESOPHAGOGASTRODUODENOSCOPY (EGD) WITH PROPOFOL (N/A )  Patient location during evaluation: Endoscopy Anesthesia Type: General Level of consciousness: awake and alert and oriented Pain management: pain level controlled Vital Signs Assessment: post-procedure vital signs reviewed and stable Respiratory status: spontaneous breathing, nonlabored ventilation and respiratory function stable Cardiovascular status: blood pressure returned to baseline and stable Postop Assessment: no signs of nausea or vomiting Anesthetic complications: no     Last Vitals:  Vitals:   08/09/18 1013 08/09/18 1110  BP: (!) 147/76 (!) 112/56  Pulse: 65 (!) 50  Resp: 16 14  Temp: 36.6 C   SpO2: 100% 96%    Last Pain:  Vitals:   08/09/18 1013  TempSrc: Tympanic  PainSc: 3                  Everly Rubalcava

## 2018-08-09 NOTE — Transfer of Care (Signed)
Immediate Anesthesia Transfer of Care Note  Patient: Manuel Cain  Procedure(s) Performed: ESOPHAGOGASTRODUODENOSCOPY (EGD) WITH PROPOFOL (N/A )  Patient Location: PACU  Anesthesia Type:General  Level of Consciousness: drowsy  Airway & Oxygen Therapy: Patient Spontanous Breathing and Patient connected to nasal cannula oxygen  Post-op Assessment: Report given to RN and Post -op Vital signs reviewed and stable  Post vital signs: Reviewed and stable  Last Vitals:  Vitals Value Taken Time  BP 112/56 08/09/2018 11:10 AM  Temp    Pulse 50 08/09/2018 11:10 AM  Resp 8 08/09/2018 11:10 AM  SpO2 97 % 08/09/2018 11:10 AM  Vitals shown include unvalidated device data.  Last Pain:  Vitals:   08/09/18 1013  TempSrc: Tympanic  PainSc: 3          Complications: No apparent anesthesia complications

## 2018-08-10 ENCOUNTER — Ambulatory Visit: Payer: Self-pay | Admitting: General Surgery

## 2018-08-10 ENCOUNTER — Encounter: Payer: Self-pay | Admitting: Internal Medicine

## 2018-08-10 LAB — SURGICAL PATHOLOGY

## 2018-08-10 NOTE — H&P (Signed)
PATIENT PROFILE: Manuel Cain is a 48 y.o. male who presents to the Clinic for consultation at the request of Dr. Chauncey Mann for evaluation of gallbladder dyskinesia.  PCP:  Dion Body, MD  HISTORY OF PRESENT ILLNESS: Manuel Cain reports having right-sided abdominal pain since more than 5 years ago.  The radiating patient thought that it was because of the type of work that he was doing but the pain has been persistent for at least 2 years.  The patient reports pain on the right upper quadrant.  Pain radiates to his right side of the back.  Pain is aggravated by food intake.  Pain started 1 hour after eating.  There is no alleviating factor.  Patient denies fever or chills.  Patient reports associated nausea but no vomiting.  Patient had an endoscopy yesterday which shows mild esophagitis and no significant finding on the stomach.  There is no ulcers or significant gastritis.  Patient ready had a HIDA scan that shows 22% of ejection fraction of the gallbladder.  Ultrasound does not shows gallstones.  I personally evaluated the images of both studies.   PROBLEM LIST:         Problem List  Date Reviewed: 08/10/2018         Noted   Biliary dyskinesia 08/10/2018   Gastroesophageal reflux disease with esophagitis - followed by [facility] 07/11/2018   B12 deficiency (260 - 11/03/17) 11/04/2017   Idiopathic peripheral neuropathy 11/03/2017   GAD (generalized anxiety disorder) 04/18/2017   Sexual dysfunction 04/18/2017   Vaccine counseling: Pt declines Tetanus vaccine (06/10/16) 06/10/2016   Hypertension, essential 10/25/2015   Morbid obesity with BMI of 40.0-44.9, adult (CMS-HCC) 10/25/2015   Leukocytosis (WBC 13.2 - 11/03/17) 10/25/2015      GENERAL REVIEW OF SYSTEMS:   General ROS: negative for - chills, fatigue, fever, weight gain.  Positive for weight loss Allergy and Immunology ROS: negative for - hives  Hematological and Lymphatic ROS: negative for - bleeding problems or bruising,  negative for palpable nodes Endocrine ROS: negative for - heat or cold intolerance, hair changes Respiratory ROS: negative for - cough, shortness of breath or wheezing Cardiovascular ROS: no chest pain or palpitations GI ROS: negative for vomiting, diarrhea, positive patient.  Positive for abdominal pain, nausea and change in appetite. Musculoskeletal ROS: negative for - joint swelling or muscle pain Neurological ROS: negative for - confusion, syncope.  Positive for headache Dermatological ROS: negative for pruritus and rash Psychiatric: Positive for for anxiety, difficulty sleeping.  Negative for depression,  and memory loss  MEDICATIONS: CurrentMedications        Current Outpatient Medications  Medication Sig Dispense Refill  . acetaminophen (TYLENOL) 650 MG ER tablet Take 1 tablet (650 mg total) by mouth every 6 (six) hours as needed for Pain  0  . albuterol 90 mcg/actuation inhaler Inhale 2 inhalations into the lungs every 6 (six) hours as needed       . benzonatate (TESSALON) 200 MG capsule Take 200 mg by mouth 3 (three) times daily as needed for Cough    . busPIRone (BUSPAR) 5 MG tablet Take 1 tablet (5 mg total) by mouth 2 (two) times daily 60 tablet 1  . cyanocobalamin (VITAMIN B12) 1000 MCG tablet Take 1,000 mcg by mouth once daily    . dexlansoprazole (DEXILANT) 60 mg DR capsule Take 60 mg by mouth once daily    . famotidine (PEPCID) 20 MG tablet Take 1 tablet (20 mg total) by mouth 2 (two) times daily  60 tablet 3  . hydroCHLOROthiazide (HYDRODIURIL) 12.5 MG tablet TAKE 1 TABLET BY MOUTH EVERY DAY 90 tablet 3  . multivitamin/iron/folic acid (CENTRUM COMPLETE ORAL) Take by mouth once daily       . PARoxetine (PAXIL) 20 MG tablet Take 1 tablet (20 mg total) by mouth nightly 30 tablet 1   No current facility-administered medications for this visit.       ALLERGIES: Valium [diazepam]  PAST MEDICAL HISTORY:     Past Medical History:  Diagnosis Date  .  Anxiety 2012  . GERD (gastroesophageal reflux disease)   . Hypertension   . Vasovagal syncope    Hx of vagal syncope per pt    PAST SURGICAL HISTORY:      Past Surgical History:  Procedure Laterality Date  . knee surgery Bilateral   . Left knee surgery       FAMILY HISTORY:      Family History  Problem Relation Age of Onset  . High blood pressure (Hypertension) Mother   . Other Mother        Borderline DM  . Hyperlipidemia (Elevated cholesterol) Mother   . Hip fracture Mother        Hip replacement  . Coronary Artery Disease (Blocked arteries around heart) Father 12  . Deep vein thrombosis (DVT or abnormal blood clot formation) Father   . Lung cancer Father   . Osteoarthritis Father        Knee replacement  . Coronary Artery Disease (Blocked arteries around heart) Paternal Grandfather      SOCIAL HISTORY: Social History          Socioeconomic History  . Marital status: Married    Spouse name: Not on file  . Number of children: Not on file  . Years of education: Not on file  . Highest education level: Not on file  Occupational History  . Not on file  Social Needs  . Financial resource strain: Not on file  . Food insecurity:    Worry: Not on file    Inability: Not on file  . Transportation needs:    Medical: Not on file    Non-medical: Not on file  Tobacco Use  . Smoking status: Never Smoker  . Smokeless tobacco: Never Used  Substance and Sexual Activity  . Alcohol use: Never  . Drug use: No  . Sexual activity: Yes    Partners: Female    Comment: Vasectomy  Other Topics Concern  . Not on file  Social History Narrative   Marital Status- Married   Lives with wife   Employment- Self-employed   Exercise hx- Stays active at work   Religious Affiliation- [place]:    Vitals:   08/10/18 0813  BP: 120/72  Pulse: 50   Body mass index is 36.48 kg/m. Weight: (!) 112 kg (247 lb)    GENERAL: Alert, active, oriented x3  HEENT: Pupils equal reactive to light. Extraocular movements are intact. Sclera clear. Palpebral conjunctiva normal red color.Pharynx clear.  NECK: Supple with no palpable mass and no adenopathy.  LUNGS: Sound clear with no rales rhonchi or wheezes.  HEART: Regular rhythm S1 and S2 without murmur.  ABDOMEN: Soft and depressible, nontender with no palpable mass, no hepatomegaly.   EXTREMITIES: Well-developed well-nourished symmetrical with no dependent edema.  NEUROLOGICAL: Awake alert oriented, facial expression symmetrical, moving all extremities.  REVIEW OF DATA: I have reviewed the following data today:  Appointment on 07/25/2018  Component Date Value  . WBC (White Blood Cell Co* 07/25/2018 14.1*  . RBC (Red Blood Cell Coun* 07/25/2018 5.39   . Hemoglobin 07/25/2018 15.3   . Hematocrit 07/25/2018 45.8   . MCV (Mean Corpuscular Vo* 07/25/2018 85.0   . MCH (Mean Corpuscular He* 07/25/2018 28.4   . MCHC (Mean Corpuscular H* 07/25/2018 33.4   . Platelet Count 07/25/2018 404   . RDW-CV (Red Cell Distrib* 07/25/2018 13.0   . MPV (Mean Platelet Volum* 07/25/2018 9.6   . Neutrophils 07/25/2018 9.71*  . Lymphocytes 07/25/2018 3.43   . Monocytes 07/25/2018 0.73   . Eosinophils 07/25/2018 0.16   . Basophils 07/25/2018 0.07   . Neutrophil % 07/25/2018 68.6   . Lymphocyte % 07/25/2018 24.3   . Monocyte % 07/25/2018 5.2   . Eosinophil % 07/25/2018 1.1   . Basophil% 07/25/2018 0.5   . Immature Granulocyte % 07/25/2018 0.3   . Immature Granulocyte Cou* 07/25/2018 0.04   . Glucose 07/25/2018 148*  . Sodium 07/25/2018 141   . Potassium 07/25/2018 3.6   . Chloride 07/25/2018 101   . Carbon Dioxide (CO2) 07/25/2018 27.3   . Calcium 07/25/2018 10.4*  . Urea Nitrogen (BUN) 07/25/2018 13   . Creatinine 07/25/2018 1.0   . Glomerular Filtration Ra* 07/25/2018 79   . BUN/Crea Ratio 07/25/2018 13.0   . Anion Gap w/K 07/25/2018 16.3*   . Protein, Total 07/25/2018 7.4   . Albumin 07/25/2018 4.8   . Bilirubin, Total 07/25/2018 0.7   . Bilirubin, Conjugated 07/25/2018 0.15   . Alk Phos (alkaline Phosp* 07/25/2018 59   . AST  07/25/2018 27   . ALT  07/25/2018 44   . Prothrombin Time 07/25/2018 12.3   . Prothrombin INR 07/25/2018 1.1*  . AFP, Serum, Tumor Marker* 07/25/2018 7.3   . A-1 Antitrypsin - [facility] 07/25/2018 130   . A-1 Antitrypsin Phenotyp* 07/25/2018 MM   . Mitochondrial Ab - [facility]* 07/25/2018 <20.0   . Antinuclear Antibodies, * 07/25/2018 Negative   . Smooth Muscle Ab - [facility]* 07/25/2018 4   . Ceruloplasmin - [facility] 07/25/2018 24.6   . CK, Total (Creatine Kina* 07/25/2018 87   . Ferritin 07/25/2018 158   . Hep C Virus Ab - [facility] 07/25/2018 <0.1   . Hepatitis B Surface Anti* 07/25/2018 Negative   . Hepatitis Be Antigen - L* 07/25/2018 Negative   . Hepatitis B Core IgM Ant* 07/25/2018 Negative   . Hep B Core Total Ab - La* 07/25/2018 Negative   . Hep B E Ab - [facility] 07/25/2018 Negative   . Hep B Surface Ab, Qual -* 07/25/2018 Non Reactive   . Hep A IgM - [facility] 07/25/2018 Negative   . Iron 07/25/2018 44*  . Total Iron Binding Capac* 07/25/2018 351.1   . Transferrin 07/25/2018 250.8   . % Saturation 07/25/2018 13   . Total IgG - [facility] 07/25/2018 1029   . Immunoglobulin A, Qn, Se* 07/25/2018 237   . IgM - [facility] 07/25/2018 119   . Immunoglobulin E, Total * 07/25/2018 47   Initial consult on 07/04/2018  Component Date Value  . WBC (White Blood Cell Co* 07/04/2018 12.5*  . RBC (Red Blood Cell Coun* 07/04/2018 5.26   . Hemoglobin 07/04/2018 15.1   . Hematocrit 07/04/2018 44.1   . MCV (Mean Corpuscular Vo* 07/04/2018 83.8   . MCH (Mean Corpuscular He* 07/04/2018 28.7   . MCHC (Mean Corpuscular H* 07/04/2018 34.2   . Platelet Count 07/04/2018  350   . RDW-CV (Red Cell Distrib* 07/04/2018 13.2   . MPV (Mean Platelet Volum* 07/04/2018 9.6   . Neutrophils 07/04/2018 8.41*  . Lymphocytes  07/04/2018 3.34   . Monocytes 07/04/2018 0.59   . Eosinophils 07/04/2018 0.11   . Basophils 07/04/2018 0.05   . Neutrophil % 07/04/2018 67.1   . Lymphocyte % 07/04/2018 26.7   . Monocyte % 07/04/2018 4.7   . Eosinophil % 07/04/2018 0.9*  . Basophil% 07/04/2018 0.4   . Immature Granulocyte % 07/04/2018 0.2   . Immature Granulocyte Cou* 07/04/2018 0.03   . Glucose 07/04/2018 98   . Sodium 07/04/2018 140   . Potassium 07/04/2018 4.3   . Chloride 07/04/2018 103   . Carbon Dioxide (CO2) 07/04/2018 28.9   . Calcium 07/04/2018 10.4*  . Urea Nitrogen (BUN) 07/04/2018 15   . Creatinine 07/04/2018 0.9   . Glomerular Filtration Ra* 07/04/2018 90   . BUN/Crea Ratio 07/04/2018 16.7   . Anion Gap w/K 07/04/2018 12.4   . Protein, Total 07/04/2018 7.2   . Albumin 07/04/2018 4.7   . Bilirubin, Total 07/04/2018 0.7   . Bilirubin, Conjugated 07/04/2018 0.11   . Alk Phos (alkaline Phosp* 07/04/2018 51   . AST  07/04/2018 23   . ALT  07/04/2018 33   . Sedimentation Rate-Autom* 07/04/2018 11   . Lipase 07/04/2018 70   . H pylori Breath Test - L* 07/04/2018 Negative   . C Reactive Protein - Lab* 07/04/2018 1      ASSESSMENT: Mr. Larrabee is a 48 y.o. male presenting for consultation for biliary dyskinesia.    Patient was oriented about the diagnosis of biliary dyskinesia. Also oriented about what is the gallbladder, its anatomy and function and the implications of having gallbladder low ejection fraction.  She already had ultrasound without gallstones and upper endoscopy with minimal findings that cannot explain his pain on the right side of the abdomen.  The type of pain that patient describes on the right side that radiates to his back and started 1 hour after eating are consistent with biliary colic.  The patient was oriented about the treatment alternatives (observation vs cholecystectomy). Patient was oriented that a low percentage of patient will continue to have similar pain symptoms even  after the gallbladder is removed. Surgical technique (open vs laparoscopic) was discussed. It was also discussed the goals of the surgery (decrease the pain episodes and avoid the risk of cholecystitis) and the risk of surgery including: bleeding, infection, common bile duct injury, stone retention, injury to other organs such as bowel, liver, stomach, other complications such as hernia, bowel obstruction among others. Also discussed with patient about anesthesia and its complications such as: reaction to medications, pneumonia, heart complications, death, among others.    Biliary dyskinesia [K82.8]  PLAN: 1. Laparoscopic cholecystectomy (65465) 2. CBC, BMP done on 07/25/2018 3. Avoid taking aspirin 4. Contact us if has any question or concern.   Patient and his wife verbalized understanding, all questions were answered, and were agreeable with the plan outlined above.   Herbert Pun, MD  Electronically signed by Herbert Pun, MD

## 2018-08-10 NOTE — H&P (View-Only) (Signed)
PATIENT PROFILE: Manuel Cain is a 48 y.o. male who presents to the Clinic for consultation at the request of Dr. Chauncey Mann for evaluation of gallbladder dyskinesia.  PCP:  Dion Body, MD  HISTORY OF PRESENT ILLNESS: Manuel Cain reports having right-sided abdominal pain since more than 5 years ago.  The radiating patient thought that it was because of the type of work that he was doing but the pain has been persistent for at least 2 years.  The patient reports pain on the right upper quadrant.  Pain radiates to his right side of the back.  Pain is aggravated by food intake.  Pain started 1 hour after eating.  There is no alleviating factor.  Patient denies fever or chills.  Patient reports associated nausea but no vomiting.  Patient had an endoscopy yesterday which shows mild esophagitis and no significant finding on the stomach.  There is no ulcers or significant gastritis.  Patient ready had a HIDA scan that shows 22% of ejection fraction of the gallbladder.  Ultrasound does not shows gallstones.  I personally evaluated the images of both studies.   PROBLEM LIST:         Problem List  Date Reviewed: 08/10/2018         Noted   Biliary dyskinesia 08/10/2018   Gastroesophageal reflux disease with esophagitis - followed by [facility] 07/11/2018   B12 deficiency (260 - 11/03/17) 11/04/2017   Idiopathic peripheral neuropathy 11/03/2017   GAD (generalized anxiety disorder) 04/18/2017   Sexual dysfunction 04/18/2017   Vaccine counseling: Pt declines Tetanus vaccine (06/10/16) 06/10/2016   Hypertension, essential 10/25/2015   Morbid obesity with BMI of 40.0-44.9, adult (CMS-HCC) 10/25/2015   Leukocytosis (WBC 13.2 - 11/03/17) 10/25/2015      GENERAL REVIEW OF SYSTEMS:   General ROS: negative for - chills, fatigue, fever, weight gain.  Positive for weight loss Allergy and Immunology ROS: negative for - hives  Hematological and Lymphatic ROS: negative for - bleeding problems or bruising,  negative for palpable nodes Endocrine ROS: negative for - heat or cold intolerance, hair changes Respiratory ROS: negative for - cough, shortness of breath or wheezing Cardiovascular ROS: no chest pain or palpitations GI ROS: negative for vomiting, diarrhea, positive patient.  Positive for abdominal pain, nausea and change in appetite. Musculoskeletal ROS: negative for - joint swelling or muscle pain Neurological ROS: negative for - confusion, syncope.  Positive for headache Dermatological ROS: negative for pruritus and rash Psychiatric: Positive for for anxiety, difficulty sleeping.  Negative for depression,  and memory loss  MEDICATIONS: CurrentMedications        Current Outpatient Medications  Medication Sig Dispense Refill  . acetaminophen (TYLENOL) 650 MG ER tablet Take 1 tablet (650 mg total) by mouth every 6 (six) hours as needed for Pain  0  . albuterol 90 mcg/actuation inhaler Inhale 2 inhalations into the lungs every 6 (six) hours as needed       . benzonatate (TESSALON) 200 MG capsule Take 200 mg by mouth 3 (three) times daily as needed for Cough    . busPIRone (BUSPAR) 5 MG tablet Take 1 tablet (5 mg total) by mouth 2 (two) times daily 60 tablet 1  . cyanocobalamin (VITAMIN B12) 1000 MCG tablet Take 1,000 mcg by mouth once daily    . dexlansoprazole (DEXILANT) 60 mg DR capsule Take 60 mg by mouth once daily    . famotidine (PEPCID) 20 MG tablet Take 1 tablet (20 mg total) by mouth 2 (two) times daily  60 tablet 3  . hydroCHLOROthiazide (HYDRODIURIL) 12.5 MG tablet TAKE 1 TABLET BY MOUTH EVERY DAY 90 tablet 3  . multivitamin/iron/folic acid (CENTRUM COMPLETE ORAL) Take by mouth once daily       . PARoxetine (PAXIL) 20 MG tablet Take 1 tablet (20 mg total) by mouth nightly 30 tablet 1   No current facility-administered medications for this visit.       ALLERGIES: Valium [diazepam]  PAST MEDICAL HISTORY:     Past Medical History:  Diagnosis Date  .  Anxiety 2012  . GERD (gastroesophageal reflux disease)   . Hypertension   . Vasovagal syncope    Hx of vagal syncope per pt    PAST SURGICAL HISTORY:      Past Surgical History:  Procedure Laterality Date  . knee surgery Bilateral   . Left knee surgery       FAMILY HISTORY:      Family History  Problem Relation Age of Onset  . High blood pressure (Hypertension) Mother   . Other Mother        Borderline DM  . Hyperlipidemia (Elevated cholesterol) Mother   . Hip fracture Mother        Hip replacement  . Coronary Artery Disease (Blocked arteries around heart) Father 5  . Deep vein thrombosis (DVT or abnormal blood clot formation) Father   . Lung cancer Father   . Osteoarthritis Father        Knee replacement  . Coronary Artery Disease (Blocked arteries around heart) Paternal Grandfather      SOCIAL HISTORY: Social History          Socioeconomic History  . Marital status: Married    Spouse name: Not on file  . Number of children: Not on file  . Years of education: Not on file  . Highest education level: Not on file  Occupational History  . Not on file  Social Needs  . Financial resource strain: Not on file  . Food insecurity:    Worry: Not on file    Inability: Not on file  . Transportation needs:    Medical: Not on file    Non-medical: Not on file  Tobacco Use  . Smoking status: Never Smoker  . Smokeless tobacco: Never Used  Substance and Sexual Activity  . Alcohol use: Never  . Drug use: No  . Sexual activity: Yes    Partners: Female    Comment: Vasectomy  Other Topics Concern  . Not on file  Social History Narrative   Marital Status- Married   Lives with wife   Employment- Self-employed   Exercise hx- Stays active at work   Religious Affiliation- [place]:    Vitals:   08/10/18 0813  BP: 120/72  Pulse: 50   Body mass index is 36.48 kg/m. Weight: (!) 112 kg (247 lb)    GENERAL: Alert, active, oriented x3  HEENT: Pupils equal reactive to light. Extraocular movements are intact. Sclera clear. Palpebral conjunctiva normal red color.Pharynx clear.  NECK: Supple with no palpable mass and no adenopathy.  LUNGS: Sound clear with no rales rhonchi or wheezes.  HEART: Regular rhythm S1 and S2 without murmur.  ABDOMEN: Soft and depressible, nontender with no palpable mass, no hepatomegaly.   EXTREMITIES: Well-developed well-nourished symmetrical with no dependent edema.  NEUROLOGICAL: Awake alert oriented, facial expression symmetrical, moving all extremities.  REVIEW OF DATA: I have reviewed the following data today:  Appointment on 07/25/2018  Component Date Value  . WBC (White Blood Cell Co* 07/25/2018 14.1*  . RBC (Red Blood Cell Coun* 07/25/2018 5.39   . Hemoglobin 07/25/2018 15.3   . Hematocrit 07/25/2018 45.8   . MCV (Mean Corpuscular Vo* 07/25/2018 85.0   . MCH (Mean Corpuscular He* 07/25/2018 28.4   . MCHC (Mean Corpuscular H* 07/25/2018 33.4   . Platelet Count 07/25/2018 404   . RDW-CV (Red Cell Distrib* 07/25/2018 13.0   . MPV (Mean Platelet Volum* 07/25/2018 9.6   . Neutrophils 07/25/2018 9.71*  . Lymphocytes 07/25/2018 3.43   . Monocytes 07/25/2018 0.73   . Eosinophils 07/25/2018 0.16   . Basophils 07/25/2018 0.07   . Neutrophil % 07/25/2018 68.6   . Lymphocyte % 07/25/2018 24.3   . Monocyte % 07/25/2018 5.2   . Eosinophil % 07/25/2018 1.1   . Basophil% 07/25/2018 0.5   . Immature Granulocyte % 07/25/2018 0.3   . Immature Granulocyte Cou* 07/25/2018 0.04   . Glucose 07/25/2018 148*  . Sodium 07/25/2018 141   . Potassium 07/25/2018 3.6   . Chloride 07/25/2018 101   . Carbon Dioxide (CO2) 07/25/2018 27.3   . Calcium 07/25/2018 10.4*  . Urea Nitrogen (BUN) 07/25/2018 13   . Creatinine 07/25/2018 1.0   . Glomerular Filtration Ra* 07/25/2018 79   . BUN/Crea Ratio 07/25/2018 13.0   . Anion Gap w/K 07/25/2018 16.3*   . Protein, Total 07/25/2018 7.4   . Albumin 07/25/2018 4.8   . Bilirubin, Total 07/25/2018 0.7   . Bilirubin, Conjugated 07/25/2018 0.15   . Alk Phos (alkaline Phosp* 07/25/2018 59   . AST  07/25/2018 27   . ALT  07/25/2018 44   . Prothrombin Time 07/25/2018 12.3   . Prothrombin INR 07/25/2018 1.1*  . AFP, Serum, Tumor Marker* 07/25/2018 7.3   . A-1 Antitrypsin - [facility] 07/25/2018 130   . A-1 Antitrypsin Phenotyp* 07/25/2018 MM   . Mitochondrial Ab - [facility]* 07/25/2018 <20.0   . Antinuclear Antibodies, * 07/25/2018 Negative   . Smooth Muscle Ab - [facility]* 07/25/2018 4   . Ceruloplasmin - [facility] 07/25/2018 24.6   . CK, Total (Creatine Kina* 07/25/2018 87   . Ferritin 07/25/2018 158   . Hep C Virus Ab - [facility] 07/25/2018 <0.1   . Hepatitis B Surface Anti* 07/25/2018 Negative   . Hepatitis Be Antigen - L* 07/25/2018 Negative   . Hepatitis B Core IgM Ant* 07/25/2018 Negative   . Hep B Core Total Ab - La* 07/25/2018 Negative   . Hep B E Ab - [facility] 07/25/2018 Negative   . Hep B Surface Ab, Qual -* 07/25/2018 Non Reactive   . Hep A IgM - [facility] 07/25/2018 Negative   . Iron 07/25/2018 44*  . Total Iron Binding Capac* 07/25/2018 351.1   . Transferrin 07/25/2018 250.8   . % Saturation 07/25/2018 13   . Total IgG - [facility] 07/25/2018 1029   . Immunoglobulin A, Qn, Se* 07/25/2018 237   . IgM - [facility] 07/25/2018 119   . Immunoglobulin E, Total * 07/25/2018 47   Initial consult on 07/04/2018  Component Date Value  . WBC (White Blood Cell Co* 07/04/2018 12.5*  . RBC (Red Blood Cell Coun* 07/04/2018 5.26   . Hemoglobin 07/04/2018 15.1   . Hematocrit 07/04/2018 44.1   . MCV (Mean Corpuscular Vo* 07/04/2018 83.8   . MCH (Mean Corpuscular He* 07/04/2018 28.7   . MCHC (Mean Corpuscular H* 07/04/2018 34.2   . Platelet Count 07/04/2018  350   . RDW-CV (Red Cell Distrib* 07/04/2018 13.2   . MPV (Mean Platelet Volum* 07/04/2018 9.6   . Neutrophils 07/04/2018 8.41*  . Lymphocytes  07/04/2018 3.34   . Monocytes 07/04/2018 0.59   . Eosinophils 07/04/2018 0.11   . Basophils 07/04/2018 0.05   . Neutrophil % 07/04/2018 67.1   . Lymphocyte % 07/04/2018 26.7   . Monocyte % 07/04/2018 4.7   . Eosinophil % 07/04/2018 0.9*  . Basophil% 07/04/2018 0.4   . Immature Granulocyte % 07/04/2018 0.2   . Immature Granulocyte Cou* 07/04/2018 0.03   . Glucose 07/04/2018 98   . Sodium 07/04/2018 140   . Potassium 07/04/2018 4.3   . Chloride 07/04/2018 103   . Carbon Dioxide (CO2) 07/04/2018 28.9   . Calcium 07/04/2018 10.4*  . Urea Nitrogen (BUN) 07/04/2018 15   . Creatinine 07/04/2018 0.9   . Glomerular Filtration Ra* 07/04/2018 90   . BUN/Crea Ratio 07/04/2018 16.7   . Anion Gap w/K 07/04/2018 12.4   . Protein, Total 07/04/2018 7.2   . Albumin 07/04/2018 4.7   . Bilirubin, Total 07/04/2018 0.7   . Bilirubin, Conjugated 07/04/2018 0.11   . Alk Phos (alkaline Phosp* 07/04/2018 51   . AST  07/04/2018 23   . ALT  07/04/2018 33   . Sedimentation Rate-Autom* 07/04/2018 11   . Lipase 07/04/2018 70   . H pylori Breath Test - L* 07/04/2018 Negative   . C Reactive Protein - Lab* 07/04/2018 1      ASSESSMENT: Manuel Cain is a 48 y.o. male presenting for consultation for biliary dyskinesia.    Patient was oriented about the diagnosis of biliary dyskinesia. Also oriented about what is the gallbladder, its anatomy and function and the implications of having gallbladder low ejection fraction.  She already had ultrasound without gallstones and upper endoscopy with minimal findings that cannot explain his pain on the right side of the abdomen.  The type of pain that patient describes on the right side that radiates to his back and started 1 hour after eating are consistent with biliary colic.  The patient was oriented about the treatment alternatives (observation vs cholecystectomy). Patient was oriented that a low percentage of patient will continue to have similar pain symptoms even  after the gallbladder is removed. Surgical technique (open vs laparoscopic) was discussed. It was also discussed the goals of the surgery (decrease the pain episodes and avoid the risk of cholecystitis) and the risk of surgery including: bleeding, infection, common bile duct injury, stone retention, injury to other organs such as bowel, liver, stomach, other complications such as hernia, bowel obstruction among others. Also discussed with patient about anesthesia and its complications such as: reaction to medications, pneumonia, heart complications, death, among others.    Biliary dyskinesia [K82.8]  PLAN: 1. Laparoscopic cholecystectomy (35686) 2. CBC, BMP done on 07/25/2018 3. Avoid taking aspirin 4. Contact us if has any question or concern.   Patient and his wife verbalized understanding, all questions were answered, and were agreeable with the plan outlined above.   Herbert Pun, MD  Electronically signed by Herbert Pun, MD

## 2018-08-17 ENCOUNTER — Encounter
Admission: RE | Admit: 2018-08-17 | Discharge: 2018-08-17 | Disposition: A | Discharge status: Home / Self Care | Payer: BLUE CROSS/BLUE SHIELD | Source: Ambulatory Visit | Attending: General Surgery | Admitting: General Surgery

## 2018-08-17 ENCOUNTER — Other Ambulatory Visit: Payer: Self-pay

## 2018-08-17 DIAGNOSIS — Z01812 Encounter for preprocedural laboratory examination: Secondary | ICD-10-CM | POA: Insufficient documentation

## 2018-08-17 HISTORY — DX: Esophagitis, unspecified without bleeding: K20.90

## 2018-08-18 ENCOUNTER — Other Ambulatory Visit
Admission: RE | Admit: 2018-08-18 | Discharge: 2018-08-18 | Disposition: A | Discharge status: Home / Self Care | Payer: BLUE CROSS/BLUE SHIELD | Source: Ambulatory Visit | Attending: General Surgery | Admitting: General Surgery

## 2018-08-18 DIAGNOSIS — Z1159 Encounter for screening for other viral diseases: Secondary | ICD-10-CM | POA: Diagnosis not present

## 2018-08-18 NOTE — Patient Instructions (Addendum)
Your procedure is scheduled on: 08-23-18 [facility] Report to Same Day Surgery 2nd floor medical mall [facility] Entrance-take elevator on left to 2nd floor.  Check in with surgery information desk.) To find out your arrival time please call [phone] between 1PM - 3PM on 08-22-18 TUESDAY  Remember: Instructions that are not followed completely may result in serious medical risk, up to and including death, or upon the discretion of your surgeon and anesthesiologist your surgery may need to be rescheduled.    _x___ 1. Do not eat food after midnight the night before your procedure. NO GUM OR CANDY AFTER MIDNIGHT. You may drink clear liquids up to 2 hours before you are scheduled to arrive at the hospital for your procedure.  Do not drink clear liquids within 2 hours of your scheduled arrival to the hospital.  Clear liquids include  --Water or Apple juice without pulp  --Clear carbohydrate beverage such as ClearFast or Gatorade  --Black Coffee or Clear Tea (No milk, no creamers, do not add anything to the coffee or Tea   ____Ensure clear carbohydrate drink on the way to the hospital for bariatric patients  ____Ensure clear carbohydrate drink 3 hours before surgery for Dr Dwyane Luo patients if physician instructed.     __x__ 2. No Alcohol for 24 hours before or after surgery.   __x__3. No Smoking or e-cigarettes for 24 prior to surgery.  Do not use any chewable tobacco products for at least 6 hour prior to surgery   ____  4. Bring all medications with you on the day of surgery if instructed.    __x__ 5. Notify your doctor if there is any change in your medical condition     (cold, fever, infections).    x___6. On the morning of surgery brush your teeth with toothpaste and water.  You may rinse your mouth with mouth wash if you wish.  Do not swallow any toothpaste or mouthwash.   Do not wear jewelry, make-up, hairpins, clips or nail polish.  Do not wear lotions, powders, or perfumes.  You may wear deodorant.  Do not shave 48 hours prior to surgery. Men may shave face and neck.  Do not bring valuables to the hospital.    [facility] is not responsible for any belongings or valuables.               Contacts, dentures or bridgework may not be worn into surgery.  Leave your suitcase in the car. After surgery it may be brought to your room.  For patients admitted to the hospital, discharge time is determined by your treatment team.  _  Patients discharged the day of surgery will not be allowed to drive home.  You will need someone to drive you home and stay with you the night of your procedure.    Please read over the following fact sheets that you were given:   [facility] Preparing for Surgery   _x___ TAKE THE FOLLOWING MEDICATION THE MORNING OF SURGERY WITH A SMALL SIP OF WATER. These include:  1. BUSPAR (BUSPIRONE)  2. DEXILANT  (DEXLANSOPRAZOLE)  3. FAMOTIDINE (PEPCID)  4.  5.  6.  ____Fleets enema or Magnesium Citrate as directed.   _x___ Use CHG Soap or sage wipes as directed on instruction sheet   _X___ Use inhalers on the day of surgery and bring to hospital day of surgery-USE YOUR FLOVENT AND ALBUTEROL INHALER AND BRING ALBUTEROL [place]  ____ Stop Metformin and Janumet 2 days  prior to surgery.    ____ Take 1/2 of usual insulin dose the night before surgery and none on the morning surgery.   ____ Follow recommendations from Cardiologist, Pulmonologist or PCP regarding stopping Aspirin, Coumadin, Plavix ,Eliquis, Effient, or Pradaxa, and Pletal.  X____Stop Anti-inflammatories such as Advil, Aleve, Ibuprofen, Motrin, Naproxen, Naprosyn, Goodies powders or aspirin products NOW-OK to take Tylenol    ____ Stop supplements until after surgery.    ____ Bring C-Pap to the hospital.

## 2018-08-19 LAB — NOVEL CORONAVIRUS, NAA (HOSP ORDER, SEND-OUT TO REF LAB; TAT 18-24 HRS): SARS-CoV-2, NAA: NOT DETECTED

## 2018-08-22 ENCOUNTER — Encounter: Payer: Self-pay | Admitting: Anesthesiology

## 2018-08-23 ENCOUNTER — Ambulatory Visit: Payer: BLUE CROSS/BLUE SHIELD | Admitting: Anesthesiology

## 2018-08-23 ENCOUNTER — Ambulatory Visit
Admission: RE | Admit: 2018-08-23 | Discharge: 2018-08-23 | Disposition: A | Discharge status: Home / Self Care | Payer: BLUE CROSS/BLUE SHIELD | Attending: General Surgery | Admitting: General Surgery

## 2018-08-23 ENCOUNTER — Other Ambulatory Visit: Payer: Self-pay

## 2018-08-23 ENCOUNTER — Encounter
Admission: RE | Disposition: A | Discharge status: Home / Self Care | Payer: Self-pay | Source: Home / Self Care | Attending: General Surgery

## 2018-08-23 DIAGNOSIS — F411 Generalized anxiety disorder: Secondary | ICD-10-CM | POA: Diagnosis not present

## 2018-08-23 DIAGNOSIS — Z79899 Other long term (current) drug therapy: Secondary | ICD-10-CM | POA: Insufficient documentation

## 2018-08-23 DIAGNOSIS — K210 Gastro-esophageal reflux disease with esophagitis: Secondary | ICD-10-CM | POA: Insufficient documentation

## 2018-08-23 DIAGNOSIS — K828 Other specified diseases of gallbladder: Secondary | ICD-10-CM | POA: Insufficient documentation

## 2018-08-23 DIAGNOSIS — Z888 Allergy status to other drugs, medicaments and biological substances status: Secondary | ICD-10-CM | POA: Insufficient documentation

## 2018-08-23 DIAGNOSIS — K811 Chronic cholecystitis: Secondary | ICD-10-CM | POA: Diagnosis not present

## 2018-08-23 DIAGNOSIS — G609 Hereditary and idiopathic neuropathy, unspecified: Secondary | ICD-10-CM | POA: Insufficient documentation

## 2018-08-23 DIAGNOSIS — I10 Essential (primary) hypertension: Secondary | ICD-10-CM | POA: Diagnosis not present

## 2018-08-23 DIAGNOSIS — Z6836 Body mass index (BMI) 36.0-36.9, adult: Secondary | ICD-10-CM | POA: Insufficient documentation

## 2018-08-23 DIAGNOSIS — E538 Deficiency of other specified B group vitamins: Secondary | ICD-10-CM | POA: Insufficient documentation

## 2018-08-23 HISTORY — PX: CHOLECYSTECTOMY: SHX55

## 2018-08-23 SURGERY — LAPAROSCOPIC CHOLECYSTECTOMY
Anesthesia: General

## 2018-08-23 MED ORDER — HYDROCODONE-ACETAMINOPHEN 5-325 MG PO TABS: 1.0000 | ORAL_TABLET | ORAL | 0 refills | Status: DC | PRN

## 2018-08-23 MED ORDER — ONDANSETRON HCL 4 MG/2ML IJ SOLN
INTRAMUSCULAR | Status: AC
  Filled 2018-08-23: qty 2

## 2018-08-23 MED ORDER — PROPOFOL 10 MG/ML IV BOLUS
INTRAVENOUS | Status: DC | PRN
  Administered 2018-08-23: 200 mg via INTRAVENOUS

## 2018-08-23 MED ORDER — DEXAMETHASONE SODIUM PHOSPHATE 10 MG/ML IJ SOLN
INTRAMUSCULAR | Status: AC
  Filled 2018-08-23: qty 1

## 2018-08-23 MED ORDER — DEXMEDETOMIDINE HCL 200 MCG/2ML IV SOLN
INTRAVENOUS | Status: DC | PRN
  Administered 2018-08-23: 8 ug via INTRAVENOUS

## 2018-08-23 MED ORDER — ONDANSETRON HCL 4 MG/2ML IJ SOLN
4.0000 mg | Freq: Once | INTRAMUSCULAR | Status: AC | PRN
  Administered 2018-08-23: 4 mg via INTRAVENOUS

## 2018-08-23 MED ORDER — FENTANYL CITRATE (PF) 100 MCG/2ML IJ SOLN
INTRAMUSCULAR | Status: AC
  Filled 2018-08-23: qty 2

## 2018-08-23 MED ORDER — SUCCINYLCHOLINE CHLORIDE 20 MG/ML IJ SOLN
INTRAMUSCULAR | Status: AC
  Filled 2018-08-23: qty 1

## 2018-08-23 MED ORDER — LIDOCAINE HCL (CARDIAC) PF 100 MG/5ML IV SOSY
PREFILLED_SYRINGE | INTRAVENOUS | Status: DC | PRN
  Administered 2018-08-23: 100 mg via INTRAVENOUS

## 2018-08-23 MED ORDER — ONDANSETRON HCL 4 MG/2ML IJ SOLN
INTRAMUSCULAR | Status: DC | PRN
  Administered 2018-08-23: 4 mg via INTRAVENOUS

## 2018-08-23 MED ORDER — BUPIVACAINE-EPINEPHRINE (PF) 0.5% -1:200000 IJ SOLN
INTRAMUSCULAR | Status: DC | PRN
  Administered 2018-08-23: 20 mL

## 2018-08-23 MED ORDER — HYDROCODONE-ACETAMINOPHEN 5-325 MG PO TABS
ORAL_TABLET | ORAL | Status: AC
  Administered 2018-08-23: 1 via ORAL
  Filled 2018-08-23: qty 1

## 2018-08-23 MED ORDER — ROCURONIUM BROMIDE 100 MG/10ML IV SOLN
INTRAVENOUS | Status: AC
  Filled 2018-08-23: qty 1

## 2018-08-23 MED ORDER — DEXMEDETOMIDINE HCL IN NACL 200 MCG/50ML IV SOLN
INTRAVENOUS | Status: AC
  Filled 2018-08-23: qty 50

## 2018-08-23 MED ORDER — PROPOFOL 10 MG/ML IV BOLUS
INTRAVENOUS | Status: AC
  Filled 2018-08-23: qty 40

## 2018-08-23 MED ORDER — KETOROLAC TROMETHAMINE 30 MG/ML IJ SOLN
INTRAMUSCULAR | Status: AC
  Filled 2018-08-23: qty 1

## 2018-08-23 MED ORDER — SUGAMMADEX SODIUM 500 MG/5ML IV SOLN
INTRAVENOUS | Status: DC | PRN
  Administered 2018-08-23: 448 mg via INTRAVENOUS

## 2018-08-23 MED ORDER — ALUM & MAG HYDROXIDE-SIMETH 200-200-20 MG/5ML PO SUSP
30.0000 mL | Freq: Once | ORAL | Status: AC
  Administered 2018-08-23: 30 mL via ORAL
  Filled 2018-08-23: qty 30

## 2018-08-23 MED ORDER — ALUM & MAG HYDROXIDE-SIMETH 200-200-20 MG/5ML PO SUSP
ORAL | Status: AC
  Filled 2018-08-23: qty 30

## 2018-08-23 MED ORDER — LIDOCAINE HCL (PF) 2 % IJ SOLN
INTRAMUSCULAR | Status: AC
  Filled 2018-08-23: qty 10

## 2018-08-23 MED ORDER — FENTANYL CITRATE (PF) 100 MCG/2ML IJ SOLN
INTRAMUSCULAR | Status: AC
  Administered 2018-08-23: 25 ug via INTRAVENOUS
  Filled 2018-08-23: qty 2

## 2018-08-23 MED ORDER — HYDROCODONE-ACETAMINOPHEN 5-325 MG PO TABS
1.0000 | ORAL_TABLET | ORAL | Status: DC | PRN
  Administered 2018-08-23: 1 via ORAL

## 2018-08-23 MED ORDER — ROCURONIUM BROMIDE 100 MG/10ML IV SOLN
INTRAVENOUS | Status: DC | PRN
  Administered 2018-08-23: 5 mg via INTRAVENOUS
  Administered 2018-08-23: 10 mg via INTRAVENOUS
  Administered 2018-08-23: 45 mg via INTRAVENOUS

## 2018-08-23 MED ORDER — FENTANYL CITRATE (PF) 100 MCG/2ML IJ SOLN
25.0000 ug | INTRAMUSCULAR | Status: AC | PRN
  Administered 2018-08-23 (×6): 25 ug via INTRAVENOUS

## 2018-08-23 MED ORDER — LACTATED RINGERS IV SOLN
INTRAVENOUS | Status: DC
  Administered 2018-08-23: 06:00:00 via INTRAVENOUS

## 2018-08-23 MED ORDER — ACETAMINOPHEN 10 MG/ML IV SOLN
INTRAVENOUS | Status: AC
  Filled 2018-08-23: qty 100

## 2018-08-23 MED ORDER — FENTANYL CITRATE (PF) 100 MCG/2ML IJ SOLN
INTRAMUSCULAR | Status: DC | PRN
  Administered 2018-08-23 (×4): 50 ug via INTRAVENOUS

## 2018-08-23 MED ORDER — SUCCINYLCHOLINE CHLORIDE 20 MG/ML IJ SOLN
INTRAMUSCULAR | Status: DC | PRN
  Administered 2018-08-23: 120 mg via INTRAVENOUS

## 2018-08-23 MED ORDER — CEFAZOLIN SODIUM-DEXTROSE 2-4 GM/100ML-% IV SOLN
INTRAVENOUS | Status: AC
  Filled 2018-08-23: qty 100

## 2018-08-23 MED ORDER — SUGAMMADEX SODIUM 500 MG/5ML IV SOLN
INTRAVENOUS | Status: AC
  Filled 2018-08-23: qty 5

## 2018-08-23 MED ORDER — ACETAMINOPHEN 10 MG/ML IV SOLN
INTRAVENOUS | Status: DC | PRN
  Administered 2018-08-23: 1000 mg via INTRAVENOUS

## 2018-08-23 MED ORDER — CEFAZOLIN SODIUM-DEXTROSE 2-4 GM/100ML-% IV SOLN
2.0000 g | INTRAVENOUS | Status: AC
  Administered 2018-08-23: 2 g via INTRAVENOUS

## 2018-08-23 MED ORDER — MIDAZOLAM HCL 2 MG/2ML IJ SOLN
INTRAMUSCULAR | Status: AC
  Filled 2018-08-23: qty 2

## 2018-08-23 MED ORDER — DEXAMETHASONE SODIUM PHOSPHATE 10 MG/ML IJ SOLN
INTRAMUSCULAR | Status: DC | PRN
  Administered 2018-08-23: 10 mg via INTRAVENOUS

## 2018-08-23 MED ORDER — BUPIVACAINE-EPINEPHRINE (PF) 0.5% -1:200000 IJ SOLN
INTRAMUSCULAR | Status: AC
  Filled 2018-08-23: qty 30

## 2018-08-23 MED ORDER — MIDAZOLAM HCL 2 MG/2ML IJ SOLN
INTRAMUSCULAR | Status: DC | PRN
  Administered 2018-08-23: 2 mg via INTRAVENOUS

## 2018-08-23 SURGICAL SUPPLY — 37 items
APPLIER CLIP 5 13 M/L LIGAMAX5 (MISCELLANEOUS) ×3
BLADE SURG SZ11 CARB STEEL (BLADE) ×3 IMPLANT
CANISTER SUCT 1200ML W/VALVE (MISCELLANEOUS) ×3 IMPLANT
CATH CHOLANG 76X19 KUMAR (CATHETERS) IMPLANT
CHLORAPREP W/TINT 26 (MISCELLANEOUS) ×3 IMPLANT
CLIP APPLIE 5 13 M/L LIGAMAX5 (MISCELLANEOUS) ×1 IMPLANT
COVER WAND RF STERILE (DRAPES) ×3 IMPLANT
DERMABOND ADVANCED (GAUZE/BANDAGES/DRESSINGS) ×2
DERMABOND ADVANCED .7 DNX12 (GAUZE/BANDAGES/DRESSINGS) ×1 IMPLANT
ELECT REM PT RETURN 9FT ADLT (ELECTROSURGICAL) ×3
ELECTRODE REM PT RTRN 9FT ADLT (ELECTROSURGICAL) ×1 IMPLANT
GLOVE BIO SURGEON STRL SZ 6.5 (GLOVE) ×2 IMPLANT
GLOVE BIO SURGEONS STRL SZ 6.5 (GLOVE) ×1
GLOVE INDICATOR 6.5 STRL GRN (GLOVE) ×3 IMPLANT
GOWN STRL REUS W/ TWL LRG LVL3 (GOWN DISPOSABLE) ×4 IMPLANT
GOWN STRL REUS W/TWL LRG LVL3 (GOWN DISPOSABLE) ×8
GRASPER SUT TROCAR 14GX15 (MISCELLANEOUS) IMPLANT
HEMOSTAT SURGICEL 2X3 (HEMOSTASIS) IMPLANT
IRRIGATION STRYKERFLOW (MISCELLANEOUS) ×1 IMPLANT
IRRIGATOR STRYKERFLOW (MISCELLANEOUS) ×3
IV NS 1000ML (IV SOLUTION) ×2
IV NS 1000ML BAXH (IV SOLUTION) ×1 IMPLANT
KIT TURNOVER KIT A (KITS) ×3 IMPLANT
LABEL OR SOLS (LABEL) IMPLANT
NEEDLE HYPO 25X1 1.5 SAFETY (NEEDLE) ×3 IMPLANT
NEEDLE INSUFFLATION 14GA 120MM (NEEDLE) ×3 IMPLANT
NS IRRIG 500ML POUR BTL (IV SOLUTION) ×3 IMPLANT
PACK LAP CHOLECYSTECTOMY (MISCELLANEOUS) ×3 IMPLANT
POUCH SPECIMEN RETRIEVAL 10MM (ENDOMECHANICALS) ×3 IMPLANT
SCISSORS METZENBAUM CVD 33 (INSTRUMENTS) ×3 IMPLANT
SET TUBE SMOKE EVAC HIGH FLOW (TUBING) ×3 IMPLANT
SLEEVE ENDOPATH XCEL 5M (ENDOMECHANICALS) ×6 IMPLANT
SUT MNCRL AB 4-0 PS2 18 (SUTURE) ×3 IMPLANT
SUT VIC AB 0 CT1 36 (SUTURE) IMPLANT
SUT VICRYL 0 AB UR-6 (SUTURE) ×3 IMPLANT
TROCAR XCEL NON-BLD 11X100MML (ENDOMECHANICALS) ×3 IMPLANT
TROCAR XCEL NON-BLD 5MMX100MML (ENDOMECHANICALS) ×3 IMPLANT

## 2018-08-23 NOTE — Op Note (Signed)
Preoperative diagnosis: Biliary Dyskinesia.  Postoperative diagnosis: Biliary Dyskinesia..  Procedure: Laparoscopic Cholecystectomy.   Anesthesia: GETA   Surgeon: Dr. Windell Moment  Wound Classification: Clean Contaminated  Indications: Patient is a 48 y.o. male developed right upper quadrant pain and on workup was found to have no stone on ultrasound but a HIDA scan with 22% of gallbladder ejection fraction. Laparoscopic cholecystectomy was elected.  Findings: Critical view of safety achieved Cystic duct and artery identified, ligated and divided Adequate hemostasis  Description of procedure: The patient was placed on the operating table in the supine position. General anesthesia was induced. A time-out was completed verifying correct patient, procedure, site, positioning, and implant(s) and/or special equipment prior to beginning this procedure. An orogastric tube was placed. The abdomen was prepped and draped in the usual sterile fashion.  An incision was made in a natural skin line above the umbilicus.  The fascia was elevated and the Veress needle inserted. Proper position was confirmed by aspiration and saline meniscus test.  The abdomen was insufflated with carbon dioxide to a pressure of 15 mmHg. The patient tolerated insufflation well. A 11-mm trocar was then inserted.  The laparoscope was inserted and the abdomen inspected. No injuries from initial trocar placement were noted. Additional trocars were then inserted in the following locations: a 5-mm trocar in the right epigastrium and two 5-mm trocars along the right costal margin. The abdomen was inspected and no abnormalities were found. The table was placed in the reverse Trendelenburg position with the right side up.  Filmy adhesions between the gallbladder and omentum, duodenum and transverse colon were lysed sharply. The dome of the gallbladder was grasped with an atraumatic grasper passed through the lateral port and retracted  over the dome of the liver. The infundibulum was also grasped with an atraumatic grasper through the midclavicular port and retracted toward the right lower quadrant. This maneuver exposed Calot's triangle. The peritoneum overlying the gallbladder infundibulum was then incised and the cystic duct and cystic artery identified and circumferentially dissected. Critical view of safety reviewed before ligating any structure. The cystic duct and cystic artery were then doubly clipped and divided close to the gallbladder.  The gallbladder was then dissected from its peritoneal attachments by electrocautery. Hemostasis was checked and the gallbladder and contained stones were removed using an endoscopic retrieval bag placed through the umbilical port. The gallbladder was passed off the table as a specimen. The gallbladder fossa was copiously irrigated with saline and hemostasis was obtained. There was no evidence of bleeding from the gallbladder fossa or cystic artery or leakage of the bile from the cystic duct stump. Secondary trocars were removed under direct vision. No bleeding was noted. The laparoscope was withdrawn and the umbilical trocar removed. The abdomen was allowed to collapse. The fascia of the 30mm trocar sites was closed with figure-of-eight 0 vicryl sutures. The skin was closed with subcuticular sutures of 4-0 monocryl and topical skin adhesive. The orogastric tube was removed.  The patient tolerated the procedure well and was taken to the postanesthesia care unit in stable condition.   Specimen: Gallbladder  Complications: None  EBL: 5 mL

## 2018-08-23 NOTE — Anesthesia Post-op Follow-up Note (Signed)
Anesthesia QCDR form completed.        

## 2018-08-23 NOTE — Anesthesia Procedure Notes (Signed)
Procedure Name: Intubation Performed by: Demetrius Charity, CRNA Pre-anesthesia Checklist: Patient identified, Patient being monitored, Timeout performed, Emergency Drugs available and Suction available Patient Re-evaluated:Patient Re-evaluated prior to induction Oxygen Delivery Method: Circle system utilized Preoxygenation: Pre-oxygenation with 100% oxygen Induction Type: IV induction Ventilation: Mask ventilation without difficulty Laryngoscope Size: McGraph and 4 Grade View: Grade II Tube type: Oral Tube size: 7.5 mm Number of attempts: 1 Airway Equipment and Method: Stylet and Video-laryngoscopy Placement Confirmation: ETT inserted through vocal cords under direct vision,  positive ETCO2 and breath sounds checked- equal and bilateral Secured at: 22 cm Tube secured with: Tape Dental Injury: Teeth and Oropharynx as per pre-operative assessment  Difficulty Due To: Difficulty was anticipated Future Recommendations: Recommend- induction with short-acting agent, and alternative techniques readily available

## 2018-08-23 NOTE — Anesthesia Preprocedure Evaluation (Signed)
Anesthesia Evaluation  Patient identified by MRN, date of birth, ID band Patient awake    Reviewed: Allergy & Precautions, NPO status , Patient's Chart, lab work & pertinent test results, reviewed documented beta blocker date and time   Airway Mallampati: III  TM Distance: >3 FB     Dental  (+) Chipped   Pulmonary shortness of breath,           Cardiovascular hypertension, Pt. on medications      Neuro/Psych  Headaches, Anxiety  Neuromuscular disease    GI/Hepatic GERD  Controlled,  Endo/Other    Renal/GU      Musculoskeletal   Abdominal   Peds  Hematology   Anesthesia Other Findings EKG ok. Hx bradycardia.  Reproductive/Obstetrics                             Anesthesia Physical Anesthesia Plan  ASA: III  Anesthesia Plan: General   Post-op Pain Management:    Induction: Intravenous  PONV Risk Score and Plan:   Airway Management Planned: Oral ETT  Additional Equipment:   Intra-op Plan:   Post-operative Plan:   Informed Consent: I have reviewed the patients History and Physical, chart, labs and discussed the procedure including the risks, benefits and alternatives for the proposed anesthesia with the patient or authorized representative who has indicated his/her understanding and acceptance.       Plan Discussed with: CRNA  Anesthesia Plan Comments:         Anesthesia Quick Evaluation

## 2018-08-23 NOTE — Interval H&P Note (Signed)
History and Physical Interval Note:  08/23/2018 6:57 AM  Manuel Cain  has presented today for surgery, with the diagnosis of K82.7 BILIARY DYSKINESIA.  The various methods of treatment have been discussed with the patient and family. After consideration of risks, benefits and other options for treatment, the patient has consented to  Procedure(s): LAPAROSCOPIC CHOLECYSTECTOMY (N/A) as a surgical intervention.  The patient's history has been reviewed, patient examined, no change in status, stable for surgery.  I have reviewed the patient's chart and labs.  Questions were answered to the patient's satisfaction.     Herbert Pun

## 2018-08-23 NOTE — Transfer of Care (Signed)
Immediate Anesthesia Transfer of Care Note  Patient: Manuel Cain  Procedure(s) Performed: LAPAROSCOPIC CHOLECYSTECTOMY (N/A )  Patient Location: PACU  Anesthesia Type:General  Level of Consciousness: drowsy  Airway & Oxygen Therapy: Patient Spontanous Breathing and Patient connected to face mask oxygen  Post-op Assessment: Report given to RN and Post -op Vital signs reviewed and stable  Post vital signs: Reviewed and stable  Last Vitals:  Vitals Value Taken Time  BP 165/81 08/23/18 0833  Temp    Pulse 67 08/23/18 0834  Resp 16 08/23/18 0834  SpO2 97 % 08/23/18 0834  Vitals shown include unvalidated device data.  Last Pain:  Vitals:   08/23/18 0620  TempSrc: Temporal  PainSc: 4          Complications: No apparent anesthesia complications

## 2018-08-23 NOTE — Discharge Instructions (Addendum)
  Diet: Resume home heart healthy regular diet.   Activity: No heavy lifting >20 pounds (children, pets, laundry, garbage) or strenuous activity until follow-up, but light activity and walking are encouraged. Do not drive or drink alcohol if taking narcotic pain medications.  Wound care: May shower with soapy water and pat dry (do not rub incisions), but no baths or submerging incision underwater until follow-up. (no swimming)   Medications: Resume all home medications. For mild to moderate pain: acetaminophen (Tylenol) or ibuprofen (if no kidney disease). Combining Tylenol with alcohol can substantially increase your risk of causing liver disease. Narcotic pain medications, if prescribed, can be used for severe pain, though may cause nausea, constipation, and drowsiness. Do not combine Tylenol and Norco within a 6 hour period as Norco contains Tylenol. If you do not need the narcotic pain medication, you do not need to fill the prescription.  Call office ([phone]) at any time if any questions, worsening pain, fevers/chills, bleeding, drainage from incision site, or other concerns.   AMBULATORY SURGERY  DISCHARGE INSTRUCTIONS   1) The drugs that you were given will stay in your system until tomorrow so for the next 24 hours you should not:  A) Drive an automobile B) Make any legal decisions C) Drink any alcoholic beverage   2) You may resume regular meals tomorrow.  Today it is better to start with liquids and gradually work up to solid foods.  You may eat anything you prefer, but it is better to start with liquids, then soup and crackers, and gradually work up to solid foods.   3) Please notify your doctor immediately if you have any unusual bleeding, trouble breathing, redness and pain at the surgery site, drainage, fever, or pain not relieved by medication.    4) Additional Instructions:        Please contact your physician with any problems or Same Day Surgery at  [phone], Monday through Friday 6 am to 4 pm, or [facility] [place] [facility] number at [phone]. 

## 2018-08-23 NOTE — Anesthesia Postprocedure Evaluation (Signed)
Anesthesia Post Note  Patient: Manuel Cain  Procedure(s) Performed: LAPAROSCOPIC CHOLECYSTECTOMY (N/A )  Patient location during evaluation: PACU Anesthesia Type: General Level of consciousness: awake and alert Pain management: pain level controlled Vital Signs Assessment: post-procedure vital signs reviewed and stable Respiratory status: spontaneous breathing, nonlabored ventilation, respiratory function stable and patient connected to nasal cannula oxygen Cardiovascular status: blood pressure returned to baseline and stable Postop Assessment: no apparent nausea or vomiting Anesthetic complications: no     Last Vitals:  Vitals:   08/23/18 0947 08/23/18 1139  BP: 122/70 135/73  Pulse: 64 (!) 54  Resp: 16 16  Temp: (!) 36.1 C   SpO2: 96% 99%    Last Pain:  Vitals:   08/23/18 0947  TempSrc: Temporal  PainSc: [place]

## 2018-08-24 LAB — SURGICAL PATHOLOGY

## 2018-08-25 ENCOUNTER — Other Ambulatory Visit: Payer: Self-pay | Admitting: General Surgery

## 2018-08-25 MED ORDER — HYDROCODONE-ACETAMINOPHEN 5-325 MG PO TABS: 1.0000 | ORAL_TABLET | ORAL | 0 refills | Status: AC | PRN

## 2018-10-12 ENCOUNTER — Other Ambulatory Visit: Payer: Self-pay | Admitting: General Surgery

## 2018-10-12 DIAGNOSIS — R109 Unspecified abdominal pain: Secondary | ICD-10-CM

## 2018-10-20 ENCOUNTER — Other Ambulatory Visit: Payer: Self-pay

## 2018-10-20 ENCOUNTER — Ambulatory Visit
Admission: RE | Admit: 2018-10-20 | Discharge: 2018-10-20 | Disposition: A | Discharge status: Home / Self Care | Payer: BLUE CROSS/BLUE SHIELD | Source: Ambulatory Visit | Attending: General Surgery | Admitting: General Surgery

## 2018-10-20 DIAGNOSIS — R109 Unspecified abdominal pain: Secondary | ICD-10-CM | POA: Insufficient documentation

## 2018-10-20 IMAGING — CT CT ABDOMEN AND PELVIS WITH CONTRAST
2 of 5 series · 16 of 46 positions shown, 18 images · IV contrast (omnipaque)
Comparison: None

CLINICAL DATA: Abdominal wall pain, post cholecystectomy on
[DATE], developed lower abdominal pain from umbilicus
bilaterally down to pelvis approximately 3 weeks post surgery

EXAM:
CT ABDOMEN AND PELVIS WITH CONTRAST
TECHNIQUE: Multidetector CT imaging of the abdomen and pelvis was performed
using the standard protocol following bolus administration of
intravenous contrast. Sagittal and coronal MPR images reconstructed
from axial data set.
CONTRAST:  100mL OMNIPAQUE IOHEXOL 300 MG/ML SOLN IV. Dilute oral
contrast.

[Series 2: abd pelvis · axial · 0.79mm/px · z∈[-1529,-1079]mm · 13 of 102 slices shown, 15 images]
[im 6/102  soft-tissue]
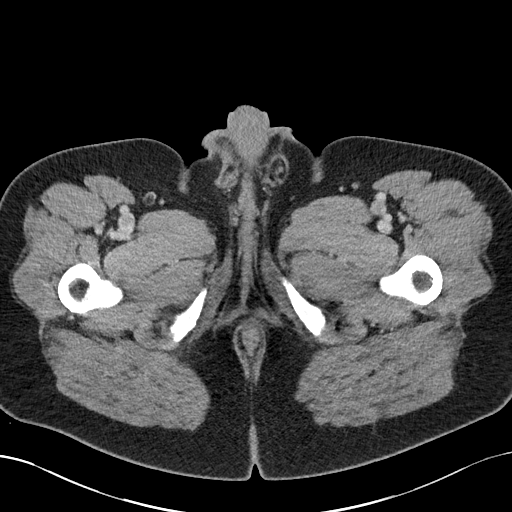
[im 6/102  bone]
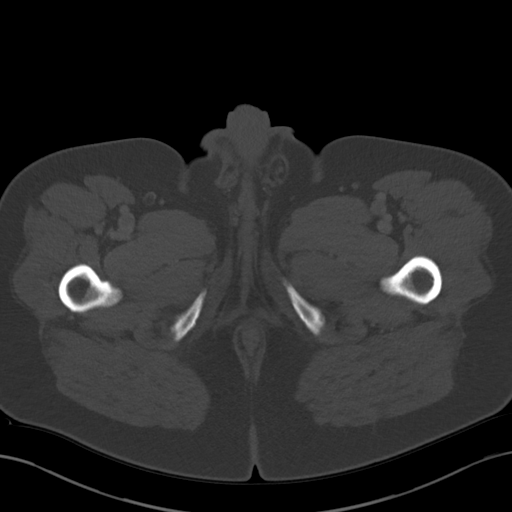
[im 12/102  soft-tissue]
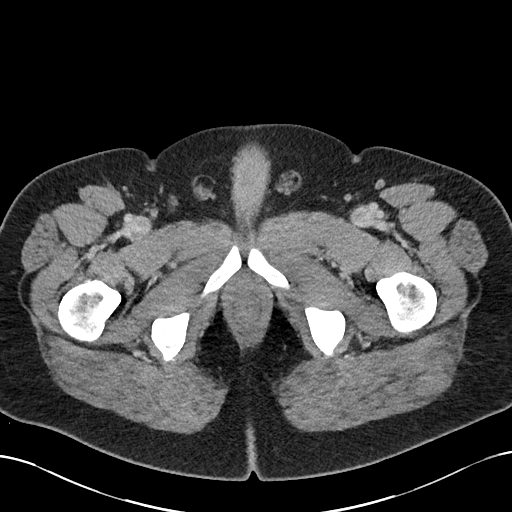
[im 24/102  soft-tissue]
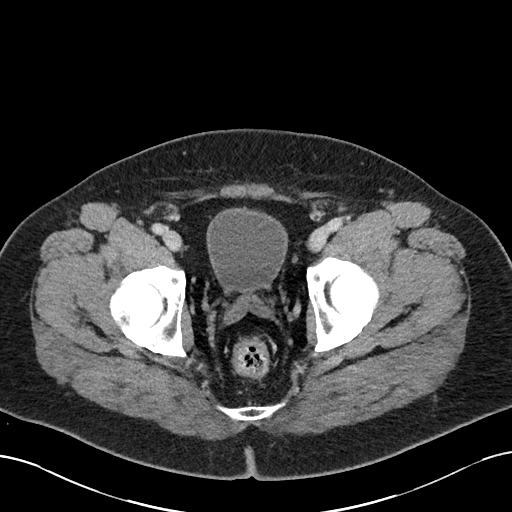
[im 30/102  soft-tissue]
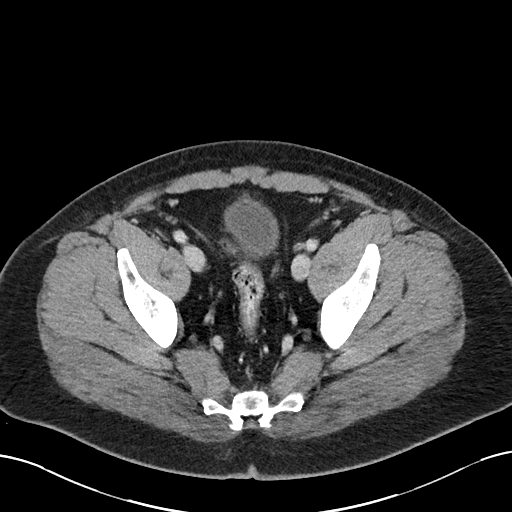
[im 36/102  soft-tissue]
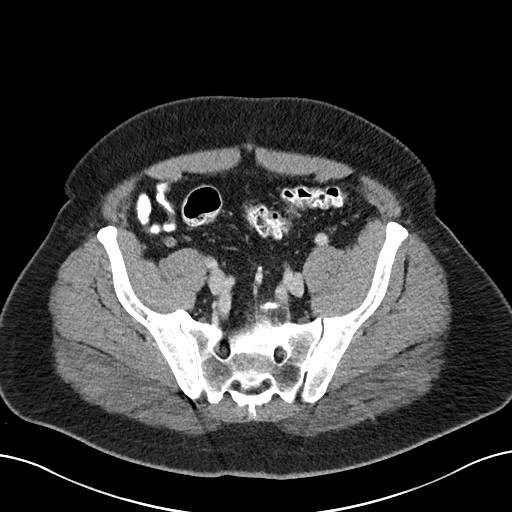
[im 42/102  soft-tissue]
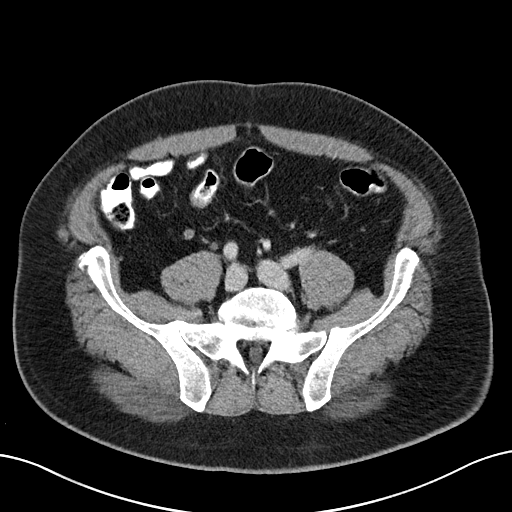
[im 54/102  soft-tissue]
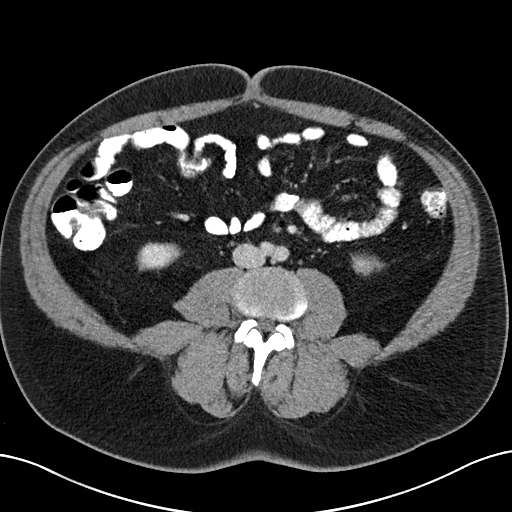
[im 60/102  soft-tissue]
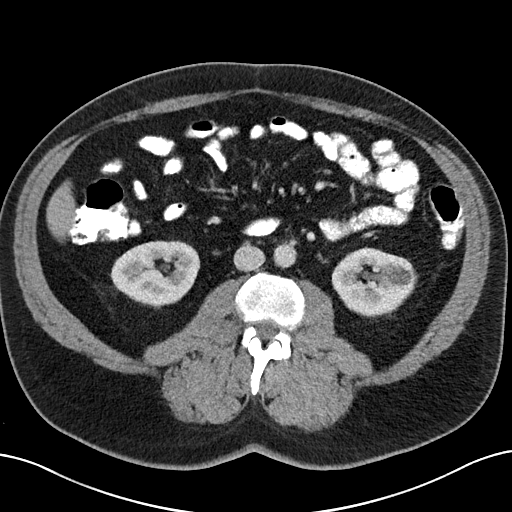
[im 66/102  soft-tissue]
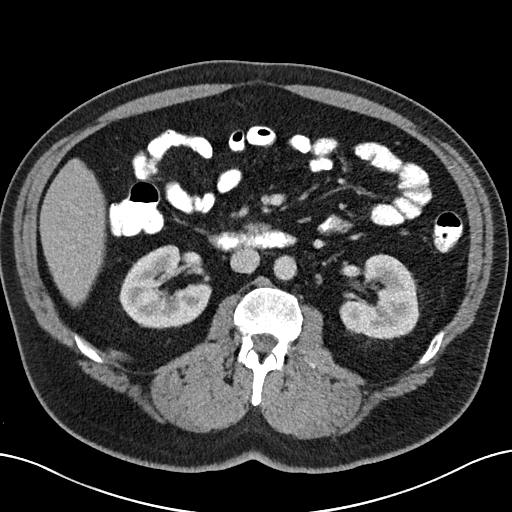
[im 66/102  bone]
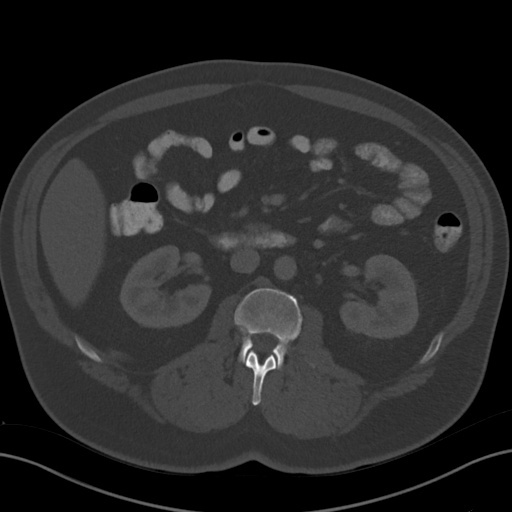
[im 72/102  soft-tissue]
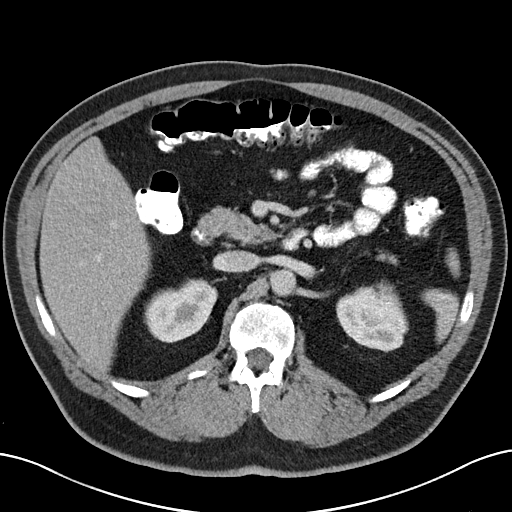
[im 78/102  soft-tissue]
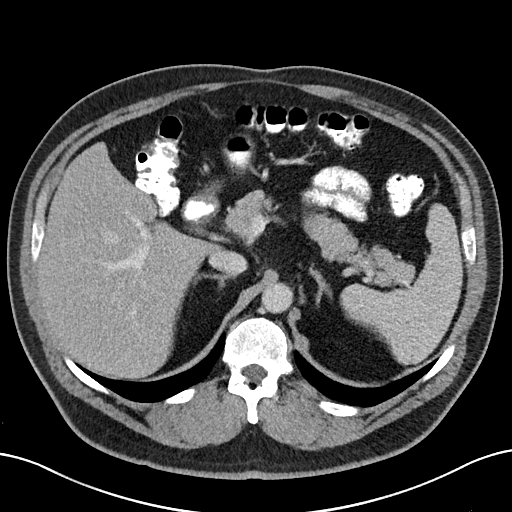
[im 90/102  soft-tissue]
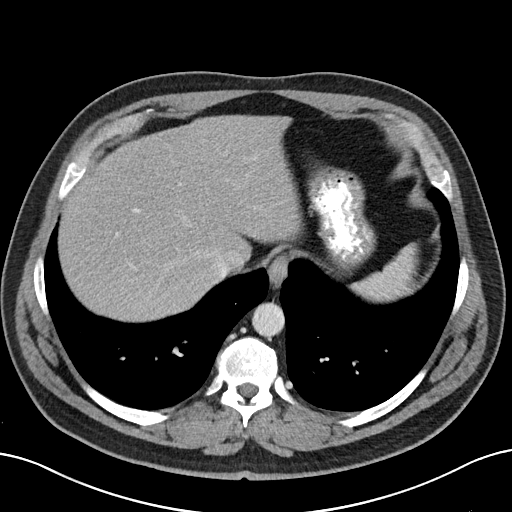
[im 96/102  soft-tissue]
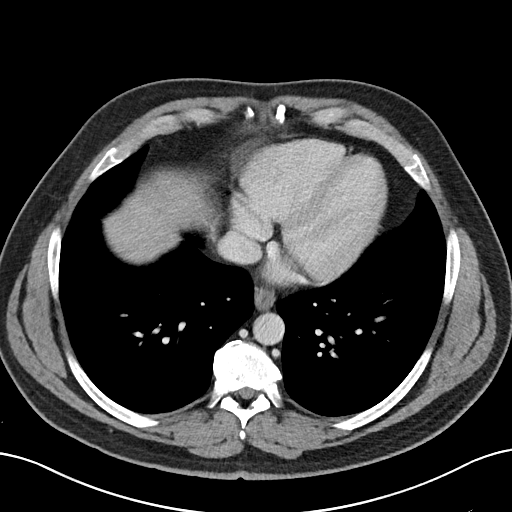

[Series 4: coronal abd pelvis · coronal · 0.79mm/px · 3 of 165 slices shown]
[im 55/165  soft-tissue]
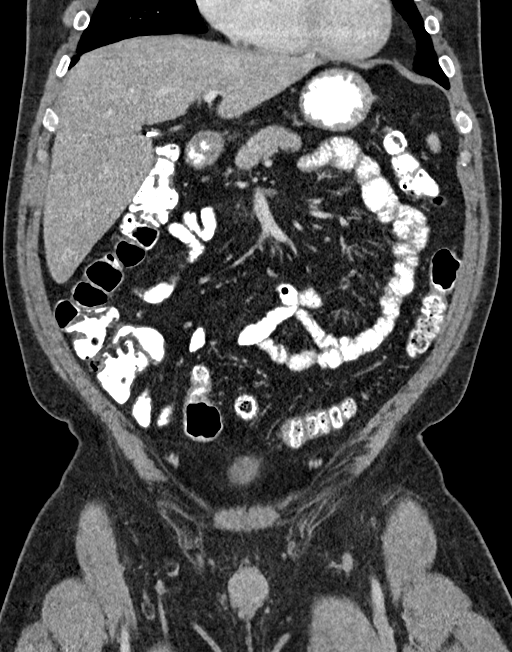
[im 73/165  soft-tissue]
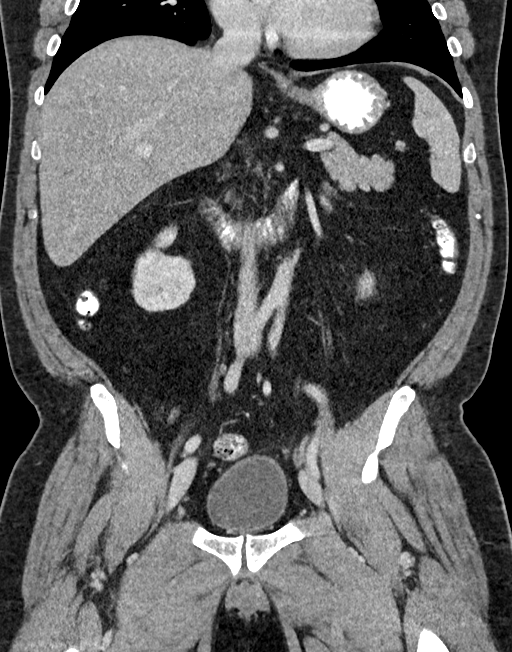
[im 92/165  soft-tissue]
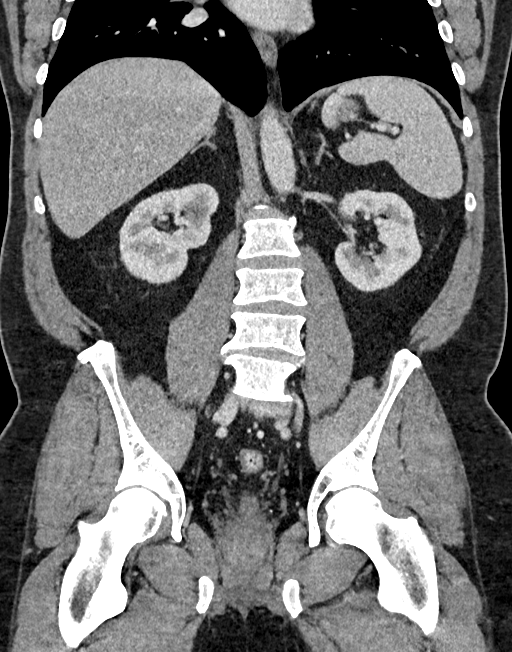

[16 of 46 positions shown; findings below may reference images not displayed]

FINDINGS: Lower chest: Lung bases clear

Hepatobiliary: Gallbladder surgically absent. No abnormal fluid
collections at gallbladder fossa. Liver normal appearance. No
biliary dilatation.

Pancreas: Normal appearance

Spleen: Normal appearance

Adrenals/Urinary Tract: Small BILATERAL renal cysts. Adrenal glands,
kidneys, ureters, and bladder otherwise normal appearance.

Stomach/Bowel: Normal appendix. Stomach and bowel loops normal
appearance.

Vascular/Lymphatic: Vascular structures grossly patent on non
targeted exam. Aorta normal caliber. No adenopathy.

Reproductive: Unremarkable seminal vesicles and prostate gland

Other: Mildly prominent inguinal canals bilaterally question normal
fat versus tiny inguinal hernia especially on RIGHT. Tiny umbilical
hernia containing fat. Minimal infiltration at umbilicus consistent
with recent surgery. No abdominal wall fluid collection, bowel
herniation, or infiltrative changes otherwise seen. No free air or
free fluid.

Musculoskeletal: AP narrowing of spinal canal at L4-L5 due to
bulging disc and facet hypertrophy. No acute osseous findings.
IMPRESSION: Post cholecystectomy without abnormalities at the gallbladder fossa
or liver.

Tiny umbilical and questionable tiny RIGHT inguinal hernias.

Otherwise negative exam.

## 2018-10-20 MED ORDER — IOHEXOL 300 MG/ML  SOLN
100.0000 mL | Freq: Once | INTRAMUSCULAR | Status: AC | PRN
  Administered 2018-10-20: 11:00:00 100 mL via INTRAVENOUS

## 2019-01-18 ENCOUNTER — Other Ambulatory Visit
Admission: RE | Admit: 2019-01-18 | Discharge: 2019-01-18 | Disposition: A | Discharge status: Home / Self Care | Payer: BLUE CROSS/BLUE SHIELD | Source: Ambulatory Visit | Attending: Pulmonary Disease | Admitting: Pulmonary Disease

## 2019-01-18 DIAGNOSIS — R0600 Dyspnea, unspecified: Secondary | ICD-10-CM | POA: Insufficient documentation

## 2019-01-18 LAB — FIBRIN DERIVATIVES D-DIMER (ARMC ONLY): Fibrin derivatives D-dimer (ARMC): 385.23 ng/mL (FEU) (ref 0.00–499.00)

## 2019-03-05 ENCOUNTER — Other Ambulatory Visit: Payer: Self-pay | Admitting: Gastroenterology

## 2019-03-05 DIAGNOSIS — K7460 Unspecified cirrhosis of liver: Secondary | ICD-10-CM

## 2019-03-12 ENCOUNTER — Ambulatory Visit: Payer: BLUE CROSS/BLUE SHIELD

## 2019-03-29 DIAGNOSIS — E559 Vitamin D deficiency, unspecified: Secondary | ICD-10-CM | POA: Insufficient documentation

## 2019-05-10 DIAGNOSIS — K222 Esophageal obstruction: Secondary | ICD-10-CM | POA: Insufficient documentation

## 2019-07-18 ENCOUNTER — Other Ambulatory Visit: Payer: Self-pay | Admitting: Family Medicine

## 2019-07-18 DIAGNOSIS — G8929 Other chronic pain: Secondary | ICD-10-CM

## 2019-07-18 DIAGNOSIS — M5416 Radiculopathy, lumbar region: Secondary | ICD-10-CM

## 2019-08-20 ENCOUNTER — Other Ambulatory Visit
Admission: RE | Admit: 2019-08-20 | Discharge: 2019-08-20 | Disposition: A | Discharge status: Home / Self Care | Payer: Managed Care, Other (non HMO) | Source: Ambulatory Visit | Attending: Student | Admitting: Student

## 2019-08-20 DIAGNOSIS — I10 Essential (primary) hypertension: Secondary | ICD-10-CM | POA: Insufficient documentation

## 2019-08-20 DIAGNOSIS — Z01818 Encounter for other preprocedural examination: Secondary | ICD-10-CM | POA: Insufficient documentation

## 2019-08-20 LAB — BRAIN NATRIURETIC PEPTIDE: B Natriuretic Peptide: 57.3 pg/mL (ref 0.0–100.0)

## 2019-09-04 ENCOUNTER — Other Ambulatory Visit: Payer: Managed Care, Other (non HMO) | Attending: Internal Medicine

## 2019-09-05 ENCOUNTER — Encounter: Admission: RE | Payer: Self-pay | Source: Home / Self Care

## 2019-09-05 ENCOUNTER — Ambulatory Visit
Admission: RE | Admit: 2019-09-05 | Payer: Managed Care, Other (non HMO) | Source: Home / Self Care | Admitting: Internal Medicine

## 2019-09-05 SURGERY — LEFT HEART CATH AND CORONARY ANGIOGRAPHY
Anesthesia: Moderate Sedation | Laterality: Left

## 2019-09-07 ENCOUNTER — Emergency Department
Admission: EM | Admit: 2019-09-07 | Discharge: 2019-09-07 | Disposition: A | Discharge status: Home / Self Care | Payer: Managed Care, Other (non HMO) | Attending: Student in an Organized Health Care Education/Training Program | Admitting: Student in an Organized Health Care Education/Training Program

## 2019-09-07 ENCOUNTER — Other Ambulatory Visit: Payer: Self-pay

## 2019-09-07 DIAGNOSIS — I10 Essential (primary) hypertension: Secondary | ICD-10-CM | POA: Insufficient documentation

## 2019-09-07 DIAGNOSIS — R55 Syncope and collapse: Secondary | ICD-10-CM | POA: Insufficient documentation

## 2019-09-07 DIAGNOSIS — M5442 Lumbago with sciatica, left side: Secondary | ICD-10-CM | POA: Diagnosis not present

## 2019-09-07 DIAGNOSIS — Z79899 Other long term (current) drug therapy: Secondary | ICD-10-CM | POA: Insufficient documentation

## 2019-09-07 DIAGNOSIS — Z9889 Other specified postprocedural states: Secondary | ICD-10-CM | POA: Diagnosis not present

## 2019-09-07 DIAGNOSIS — M5441 Lumbago with sciatica, right side: Secondary | ICD-10-CM | POA: Diagnosis not present

## 2019-09-07 LAB — CBC WITH DIFFERENTIAL/PLATELET
Abs Immature Granulocytes: 0.08 10*3/uL — ABNORMAL HIGH (ref 0.00–0.07)
Basophils Absolute: 0.1 10*3/uL (ref 0.0–0.1)
Basophils Relative: 0 %
Eosinophils Absolute: 0.2 10*3/uL (ref 0.0–0.5)
Eosinophils Relative: 1 %
HCT: 38.9 % — ABNORMAL LOW (ref 39.0–52.0)
Hemoglobin: 13.5 g/dL (ref 13.0–17.0)
Immature Granulocytes: 1 %
Lymphocytes Relative: 18 %
Lymphs Abs: 2.7 10*3/uL (ref 0.7–4.0)
MCH: 28.8 pg (ref 26.0–34.0)
MCHC: 34.7 g/dL (ref 30.0–36.0)
MCV: 82.9 fL (ref 80.0–100.0)
Monocytes Absolute: 0.5 10*3/uL (ref 0.1–1.0)
Monocytes Relative: 4 %
Neutro Abs: 11.3 10*3/uL — ABNORMAL HIGH (ref 1.7–7.7)
Neutrophils Relative %: 76 %
Platelets: 313 10*3/uL (ref 150–400)
RBC: 4.69 MIL/uL (ref 4.22–5.81)
RDW: 13.2 % (ref 11.5–15.5)
WBC: 14.9 10*3/uL — ABNORMAL HIGH (ref 4.0–10.5)
nRBC: 0 % (ref 0.0–0.2)

## 2019-09-07 LAB — BASIC METABOLIC PANEL
Anion gap: 9 (ref 5–15)
BUN: 14 mg/dL (ref 6–20)
CO2: 25 mmol/L (ref 22–32)
Calcium: 8.9 mg/dL (ref 8.9–10.3)
Chloride: 106 mmol/L (ref 98–111)
Creatinine, Ser: 1.17 mg/dL (ref 0.61–1.24)
GFR calc Af Amer: >60 mL/min (ref >60)
GFR calc non Af Amer: >60 mL/min (ref >60)
Glucose, Bld: 115 mg/dL — ABNORMAL HIGH (ref 70–99)
Potassium: 3.2 mmol/L — ABNORMAL LOW (ref 3.5–5.1)
Sodium: 140 mmol/L (ref 135–145)

## 2019-09-07 MED ORDER — HYDROCODONE-ACETAMINOPHEN 5-325 MG PO TABS: 1.0000 | ORAL_TABLET | Freq: Three times a day (TID) | ORAL | 0 refills | Status: DC | PRN

## 2019-09-07 MED ORDER — ONDANSETRON 4 MG PO TBDP: 4.0000 mg | ORAL_TABLET | Freq: Three times a day (TID) | ORAL | 0 refills | Status: DC | PRN

## 2019-09-07 MED ORDER — ONDANSETRON HCL 4 MG/2ML IJ SOLN
4.0000 mg | Freq: Once | INTRAMUSCULAR | Status: AC
  Administered 2019-09-07: 4 mg via INTRAVENOUS
  Filled 2019-09-07: qty 2

## 2019-09-07 MED ORDER — MORPHINE SULFATE (PF) 2 MG/ML IV SOLN
2.0000 mg | Freq: Once | INTRAVENOUS | Status: AC
  Administered 2019-09-07: 2 mg via INTRAVENOUS
  Filled 2019-09-07: qty 1

## 2019-09-07 MED ORDER — ORPHENADRINE CITRATE 30 MG/ML IJ SOLN
60.0000 mg | INTRAMUSCULAR | Status: AC
  Administered 2019-09-07: 60 mg via INTRAVENOUS
  Filled 2019-09-07: qty 2

## 2019-09-07 MED ORDER — SODIUM CHLORIDE 0.9 % IV BOLUS
500.0000 mL | Freq: Once | INTRAVENOUS | Status: AC
  Administered 2019-09-07: 500 mL via INTRAVENOUS

## 2019-09-07 NOTE — ED Triage Notes (Signed)
Pt to ED via [facility] from [place]. 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 Denies hitting head with LOC EMS reports hx sinus brady.  EMS placed 20G to L AC and gave 500 NS bolus enroute.  Pt c/o nausea after syncopal episode  Pt alert and oriented.

## 2019-09-07 NOTE — Discharge Instructions (Addendum)
Your exam, EKG, and labs are normal following your procedure and syncopal episode. You should follow-up with your provider for further management.

## 2019-09-07 NOTE — ED Provider Notes (Signed)
[facility] Emergency Department Provider Note ____________________________________________  Time seen: 23  I have reviewed the triage vital signs and the nursing notes.  HISTORY  Chief Complaint  Loss of Consciousness  HPI Manuel Cain is a 49 y.o. male presents to the ED for evaluation of a syncopal episode at [facility] clinic. He was there for his 1st ESI to the lumbar spine. He initially had bradycardia just as the procedure ended. He has another episode just prior to leaving. The provider suggested he report to the ED for evaluation. He is without chest pain, SOB, nausea, vomiting, or weakness. He has a history of bradycardia.   Past Medical History:  Diagnosis Date  . Anxiety   . Blood in stool    dark pebble like per pt  x4 wks  . Chest pain    on going per pt-x 73mo  . Chronic ankle pain    Right, s/p OMV(6720)  . Chronic hip pain    right, s/p NOB(0962)  . Curvature of spine   . Dyspnea   . Esophagitis   . Frequent nosebleeds    x 2 mo per pt (march 2018)  . GERD (gastroesophageal reflux disease)   . Head injury 04/2016      . Hemorrhoids   . Hypertension   . Memory changes    x 1 yr-since 2017  . Migraine    since 2013  . Vasovagal syncope     Patient Active Problem List   Diagnosis Date Noted  . Chronic nonintractable headache 07/15/2018  . Acute upper respiratory infection 05/25/2018  . Cough 05/25/2018  . B12 deficiency 11/04/2017  . Idiopathic peripheral neuropathy 11/03/2017  . Sexual dysfunction 04/18/2017  . Situational anxiety 04/18/2017  . Hematochezia   . Benign neoplasm of transverse colon   . Rectal polyp   . Gastroesophageal reflux disease   . Vaccine counseling 06/10/2016  . Essential hypertension 10/25/2015  . Morbid obesity with BMI of 40.0-44.9, adult ([place]) 10/25/2015  . Leukocytosis 10/25/2015  . Chest pain, unspecified 10/24/2015    Past Surgical History:  Procedure Laterality Date  . CHOLECYSTECTOMY  N/A 08/23/2018   Procedure: LAPAROSCOPIC CHOLECYSTECTOMY;  Surgeon: Herbert Pun, MD;  Location: [facility];  Service: General;  Laterality: N/A;  . COLONOSCOPY WITH PROPOFOL N/A 12/16/2016   Procedure: COLONOSCOPY WITH PROPOFOL;  Surgeon: Lucilla Lame, MD;  Location: [place];  Service: Endoscopy;  Laterality: N/A;  . ESOPHAGOGASTRODUODENOSCOPY (EGD) WITH PROPOFOL N/A 12/16/2016   Procedure: ESOPHAGOGASTRODUODENOSCOPY (EGD) WITH PROPOFOL;  Surgeon: Lucilla Lame, MD;  Location: [place];  Service: Endoscopy;  Laterality: N/A;  . ESOPHAGOGASTRODUODENOSCOPY (EGD) WITH PROPOFOL N/A 08/09/2018   Procedure: ESOPHAGOGASTRODUODENOSCOPY (EGD) WITH PROPOFOL;  Surgeon: Toledo, Benay Pike, MD;  Location: [facility];  Service: Gastroenterology;  Laterality: N/A;  . KNEE SURGERY Left   . POLYPECTOMY  12/16/2016   Procedure: POLYPECTOMY;  Surgeon: Lucilla Lame, MD;  Location: [place];  Service: Endoscopy;;  . vastectomy  1999    Prior to Admission medications   Medication Sig Start Date End Date Taking? Authorizing Provider  albuterol (PROVENTIL HFA;VENTOLIN HFA) 108 (90 Base) MCG/ACT inhaler Inhale 2 puffs into the lungs every 4 (four) hours as needed for shortness of breath (or chest tightness). 06/02/18   Arta Silence, MD  budesonide (PULMICORT) 0.5 MG/2ML nebulizer solution Take 0.5 mg by nebulization in the morning and at bedtime.  08/21/19   [provider]  Calcium-Vitamin D-Vitamin K (VIACTIV CALCIUM PLUS D) 650-12.5-40  MG-MCG-MCG CHEW Chew 1 each by mouth in the morning and at bedtime.    [provider]  fluticasone (FLOVENT HFA) 220 MCG/ACT inhaler Inhale 2 puffs into the lungs 2 (two) times daily as needed (asthma).     [provider]  HYDROcodone-acetaminophen (NORCO) 5-325 MG tablet Take 1 tablet by mouth 3 (three) times daily as needed. 09/07/19   Ilanna Deihl, Dannielle Karvonen, PA-C  Multiple Vitamins-Minerals (CENTRUM SILVER  50+MEN PO) Take 1 tablet by mouth daily.     [provider]  ondansetron (ZOFRAN ODT) 4 MG disintegrating tablet Take 1 tablet (4 mg total) by mouth every 8 (eight) hours as needed. 09/07/19   Bader Stubblefield, Dannielle Karvonen, PA-C  pantoprazole (PROTONIX) 20 MG tablet Take 40 mg by mouth 2 (two) times daily before a meal.  07/25/19   [provider]  PARoxetine (PAXIL) 10 MG tablet Take 15 mg by mouth at bedtime.     [provider]  topiramate (TOPAMAX) 50 MG tablet Take 25 mg by mouth 2 (two) times daily.    [provider]  vitamin B-12 (CYANOCOBALAMIN) 500 MCG tablet Take 1,500 mcg by mouth daily.     [provider]    Allergies Valium [diazepam]  Family History  Problem Relation Age of Onset  . Hypertension Mother   . Breast cancer Mother   . CAD Father   . Heart attack Father   . Bladder Cancer Brother     Social History Social History   Tobacco Use  . Smoking status: Never Smoker  . Smokeless tobacco: Never Used  Vaping Use  . Vaping Use: Never used  Substance Use Topics  . Alcohol use: No  . Drug use: No    Review of Systems  Constitutional: Negative for fever. Eyes: Negative for visual changes. ENT: Negative for sore throat. Cardiovascular: Negative for chest pain. Vasovagal episode as above.  Respiratory: Negative for shortness of breath. Gastrointestinal: Negative for abdominal pain, vomiting and diarrhea. Genitourinary: Negative for dysuria. Musculoskeletal: Positive for back pain. Skin: Negative for rash. Neurological: Negative for headaches, focal weakness or numbness. ____________________________________________  PHYSICAL EXAM:  VITAL SIGNS: ED Triage Vitals  Enc Vitals Group     BP 09/07/19 1724 124/76     Pulse Rate 09/07/19 1724 (!) 56     Resp 09/07/19 1724 16     Temp 09/07/19 1721 97.8 F (36.6 C)     Temp Source 09/07/19 1721 Oral     SpO2 09/07/19 1724 100 %     Weight 09/07/19 1721 250 lb (113.4 kg)      Height 09/07/19 1721 5\' 9"  (1.753 m)     Head Circumference --      Peak Flow --      Pain Score 09/07/19 1721 5     Pain Loc --      Pain Edu? --      Excl. in [place]? --     Constitutional: Alert and oriented. Well appearing and in no distress. GCS = 15 Head: Normocephalic and atraumatic. Eyes: Conjunctivae are normal. PERRL. Normal extraocular movements Cardiovascular: Normal brady rate, regular rhythm. Normal distal pulses. Respiratory: Normal respiratory effort. No wheezes/rales/rhonchi. Gastrointestinal: Soft and nontender. No distention. Musculoskeletal: Mildly tender to palpation just below the injection site on the left lumbar sacral junction.  No midline tenderness, spasm, deformity, or step-off.  Nontender with normal range of motion in all extremities.  Neurologic: Nerves II through XII grossly intact.  Negative supine straight  leg raise bilaterally.  Normal DTRs distally.  Normal gait without ataxia. Normal speech and language. No gross focal neurologic deficits are appreciated. Skin:  Skin is warm, dry and intact. No rash noted. Psychiatric: Mood and affect are normal. Patient exhibits appropriate insight and judgment. ____________________________________________   LABS (pertinent positives/negatives) Labs Reviewed  BASIC METABOLIC PANEL - Abnormal; Notable for the following components:      Result Value   Potassium 3.2 (*)    Glucose, Bld 115 (*)    All other components within normal limits  CBC WITH DIFFERENTIAL/PLATELET - Abnormal; Notable for the following components:   WBC 14.9 (*)    HCT 38.9 (*)    Neutro Abs 11.3 (*)    Abs Immature Granulocytes 0.08 (*)    All other components within normal limits  CBG MONITORING, ED  ____________________________________________  EKG  Sinus brady 57 bpm PR Interval 188 ms QRS duration 94 ms Normal axis No STEMI ____________________________________________  PROCEDURES  NS 500 ml bolus x 2 Morphine 2 mg  IVP Zofran 4 mg IVP Norflex 60 mg IVP  Procedures ____________________________________________  INITIAL IMPRESSION / ASSESSMENT AND PLAN / ED COURSE  Patient with ED evaluation of a syncopal episode following a facet joint injection procedure. He has been stable here with baseline bradycardia from 48-52 bpm. He has reported his pain is improved to 3/10 at the time of discharge. He is able to sit bedside without pain or syncopal response. He will be discharged with prescriptions for Norco (#10) and Zofran. He will follow-up with pain management or neurology for ongoing symptomatic care. Return precautions and facet injection aftercare instructions are reviewed.   Manuel Cain was evaluated in Emergency Department on 09/07/2019 for the symptoms described in the history of present illness. He was evaluated in the context of the global COVID-19 pandemic, which necessitated consideration that the patient might be at risk for infection with the SARS-CoV-2 virus that causes COVID-19. Institutional protocols and algorithms that pertain to the evaluation of patients at risk for COVID-19 are in a state of rapid change based on information released by regulatory bodies including the CDC and federal and state organizations. These policies and algorithms were followed during the patient's care in the ED. ____________________________________________  FINAL CLINICAL IMPRESSION(S) / ED DIAGNOSES  Final diagnoses:  Vasovagal syncope  Chronic bilateral low back pain with bilateral sciatica      Carmie End, Dannielle Karvonen, PA-C 09/07/19 2025    Merlyn Lot, MD 09/07/19 2317

## 2019-12-11 DIAGNOSIS — M4316 Spondylolisthesis, lumbar region: Secondary | ICD-10-CM | POA: Insufficient documentation

## 2019-12-25 ENCOUNTER — Other Ambulatory Visit: Payer: Self-pay

## 2019-12-25 ENCOUNTER — Encounter: Payer: Self-pay | Admitting: *Deleted

## 2019-12-25 ENCOUNTER — Observation Stay
Admission: EM | Admit: 2019-12-25 | Discharge: 2019-12-26 | Disposition: A | Discharge status: Home / Self Care | Payer: Managed Care, Other (non HMO) | Attending: Neurosurgery | Admitting: Neurosurgery

## 2019-12-25 ENCOUNTER — Emergency Department: Payer: Managed Care, Other (non HMO)

## 2019-12-25 DIAGNOSIS — Z20822 Contact with and (suspected) exposure to covid-19: Secondary | ICD-10-CM | POA: Insufficient documentation

## 2019-12-25 DIAGNOSIS — Z981 Arthrodesis status: Secondary | ICD-10-CM

## 2019-12-25 DIAGNOSIS — I10 Essential (primary) hypertension: Secondary | ICD-10-CM | POA: Insufficient documentation

## 2019-12-25 DIAGNOSIS — R109 Unspecified abdominal pain: Secondary | ICD-10-CM | POA: Diagnosis not present

## 2019-12-25 DIAGNOSIS — N50811 Right testicular pain: Principal | ICD-10-CM | POA: Insufficient documentation

## 2019-12-25 DIAGNOSIS — R1031 Right lower quadrant pain: Secondary | ICD-10-CM

## 2019-12-25 LAB — CBC WITH DIFFERENTIAL/PLATELET
Abs Immature Granulocytes: 0.1 10*3/uL — ABNORMAL HIGH (ref 0.00–0.07)
Basophils Absolute: 0.1 10*3/uL (ref 0.0–0.1)
Basophils Relative: 1 %
Eosinophils Absolute: 0.8 10*3/uL — ABNORMAL HIGH (ref 0.0–0.5)
Eosinophils Relative: 6 %
HCT: 41 % (ref 39.0–52.0)
Hemoglobin: 13.7 g/dL (ref 13.0–17.0)
Immature Granulocytes: 1 %
Lymphocytes Relative: 26 %
Lymphs Abs: 3.7 10*3/uL (ref 0.7–4.0)
MCH: 27.8 pg (ref 26.0–34.0)
MCHC: 33.4 g/dL (ref 30.0–36.0)
MCV: 83.2 fL (ref 80.0–100.0)
Monocytes Absolute: 0.6 10*3/uL (ref 0.1–1.0)
Monocytes Relative: 4 %
Neutro Abs: 8.6 10*3/uL — ABNORMAL HIGH (ref 1.7–7.7)
Neutrophils Relative %: 62 %
Platelets: 388 10*3/uL (ref 150–400)
RBC: 4.93 MIL/uL (ref 4.22–5.81)
RDW: 12.9 % (ref 11.5–15.5)
WBC: 13.9 10*3/uL — ABNORMAL HIGH (ref 4.0–10.5)
nRBC: 0 % (ref 0.0–0.2)

## 2019-12-25 LAB — COMPREHENSIVE METABOLIC PANEL
ALT: 27 U/L (ref 0–44)
AST: 23 U/L (ref 15–41)
Albumin: 4.3 g/dL (ref 3.5–5.0)
Alkaline Phosphatase: 61 U/L (ref 38–126)
Anion gap: 10 (ref 5–15)
BUN: 17 mg/dL (ref 6–20)
CO2: 26 mmol/L (ref 22–32)
Calcium: 9.7 mg/dL (ref 8.9–10.3)
Chloride: 101 mmol/L (ref 98–111)
Creatinine, Ser: 0.95 mg/dL (ref 0.61–1.24)
GFR, Estimated: >60 mL/min (ref >60)
Glucose, Bld: 100 mg/dL — ABNORMAL HIGH (ref 70–99)
Potassium: 3.5 mmol/L (ref 3.5–5.1)
Sodium: 137 mmol/L (ref 135–145)
Total Bilirubin: 0.6 mg/dL (ref 0.3–1.2)
Total Protein: 8 g/dL (ref 6.5–8.1)

## 2019-12-25 LAB — URINALYSIS, COMPLETE (UACMP) WITH MICROSCOPIC
Bacteria, UA: NONE SEEN
Bilirubin Urine: NEGATIVE
Glucose, UA: NEGATIVE mg/dL
Hgb urine dipstick: NEGATIVE
Ketones, ur: NEGATIVE mg/dL
Leukocytes,Ua: NEGATIVE
Nitrite: NEGATIVE
Protein, ur: NEGATIVE mg/dL
Specific Gravity, Urine: 1.014 (ref 1.005–1.030)
Squamous Epithelial / HPF: NONE SEEN (ref 0–5)
pH: 5 (ref 5.0–8.0)

## 2019-12-25 LAB — SEDIMENTATION RATE: Sed Rate: 13 mm/hr (ref 0–20)

## 2019-12-25 IMAGING — US US SCROTUM W/ DOPPLER COMPLETE
1 series · 15 of 25 positions shown · non-contrast
Comparison: None.

CLINICAL DATA: Right testicular pain

EXAM:
SCROTAL ULTRASOUND
DOPPLER ULTRASOUND OF THE TESTICLES
TECHNIQUE: Complete ultrasound examination of the testicles, epididymis, and
other scrotal structures was performed. Color and spectral Doppler
ultrasound were also utilized to evaluate blood flow to the
testicles.

[Series 1: us art/ven flow abd pelv doppl · 15 of 70 slices shown]
[im 1/70]
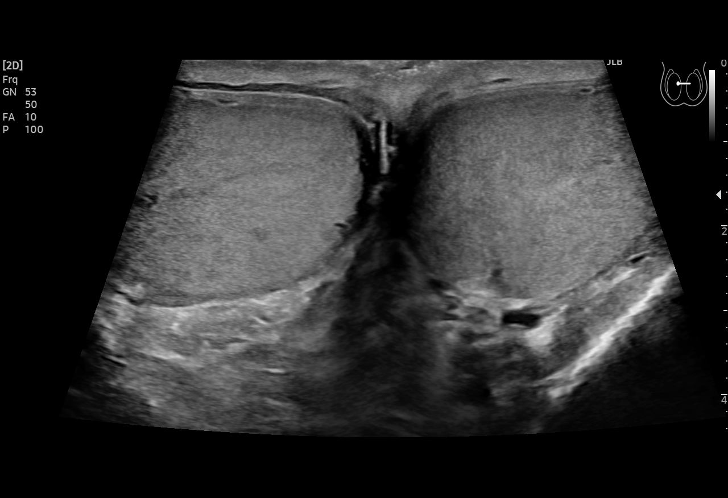
[im 6/70]
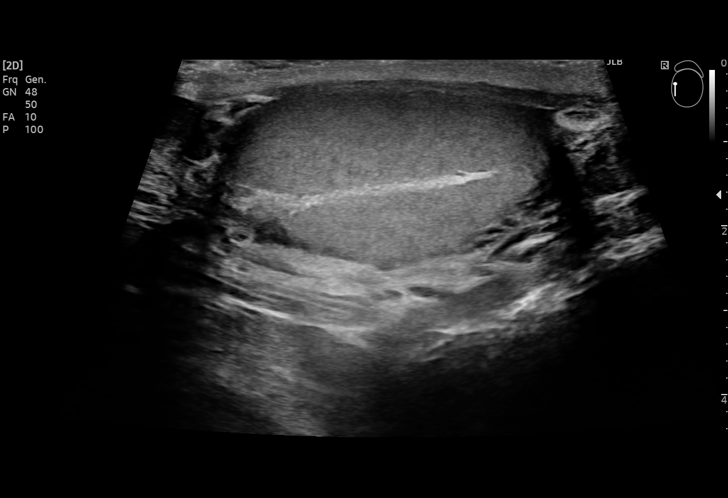
[im 12/70]
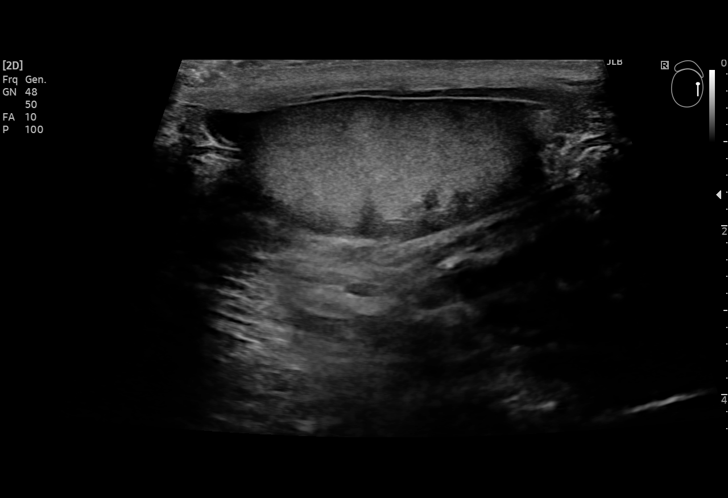
[im 15/70]
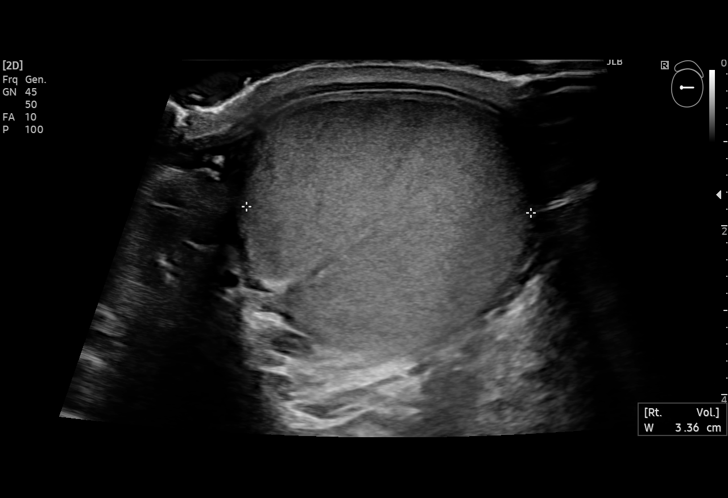
[im 21/70]
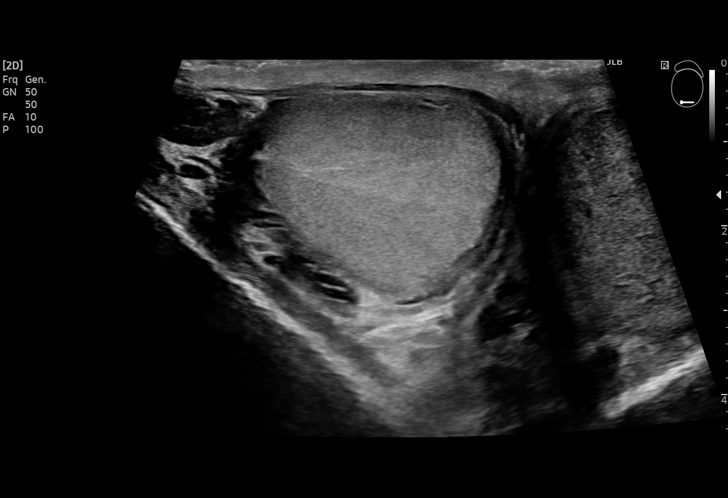
[im 26/70]
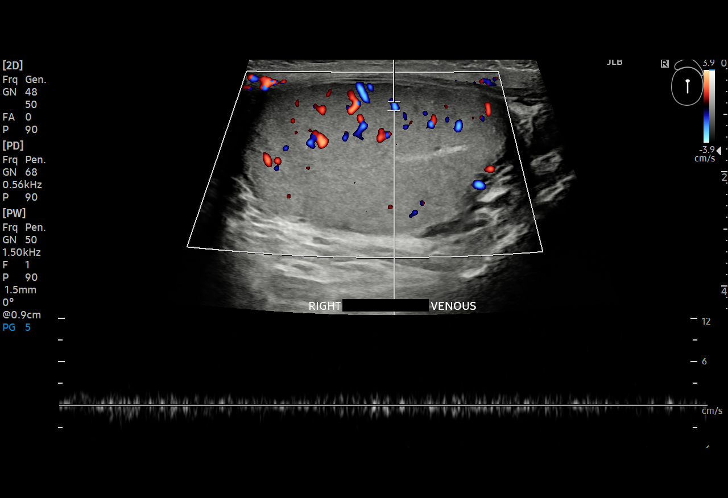
[im 29/70]
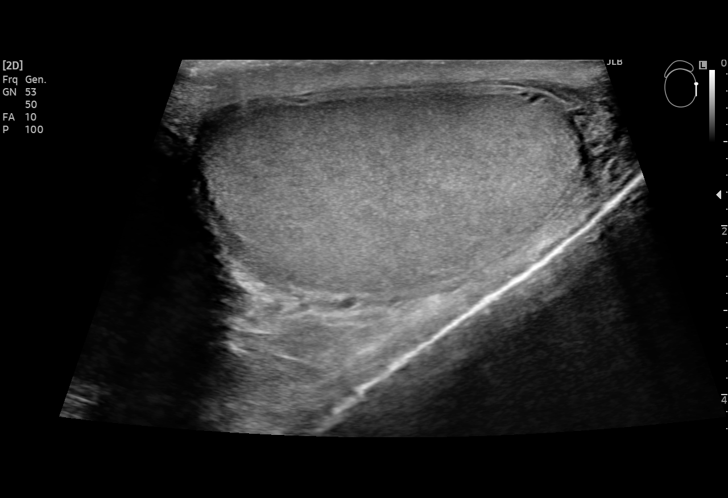
[im 35/70]
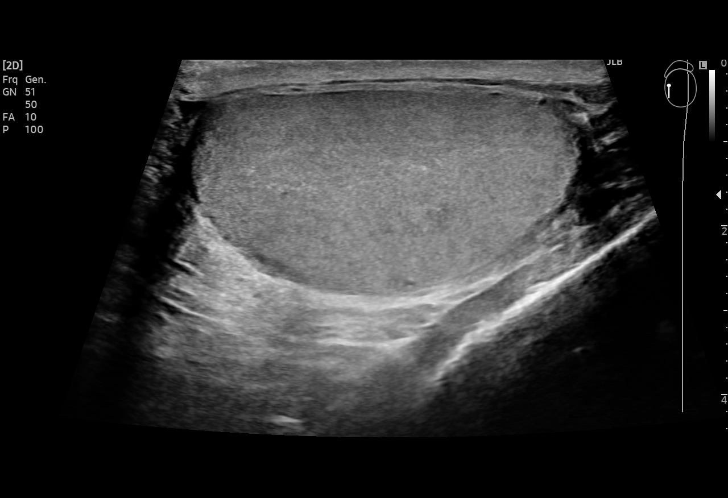
[im 41/70]
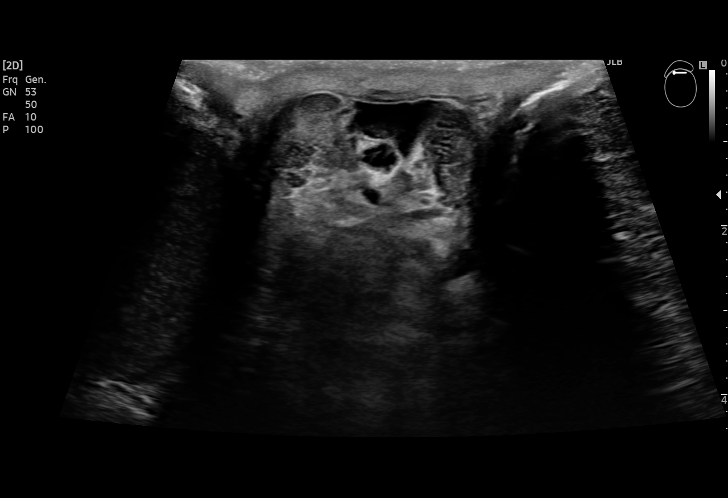
[im 44/70]
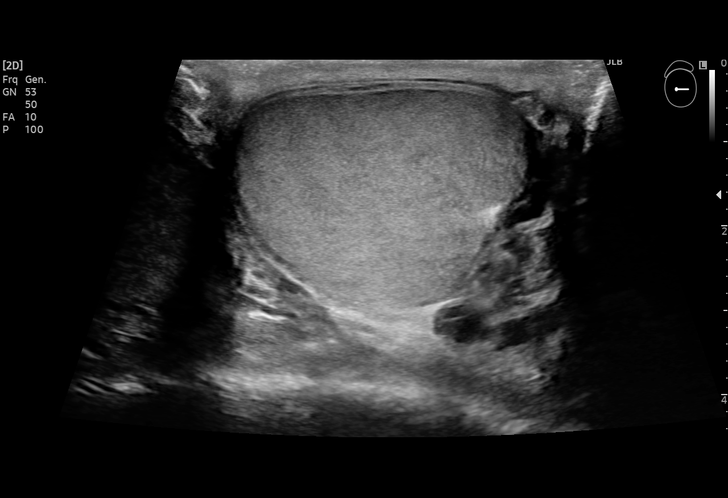
[im 49/70]
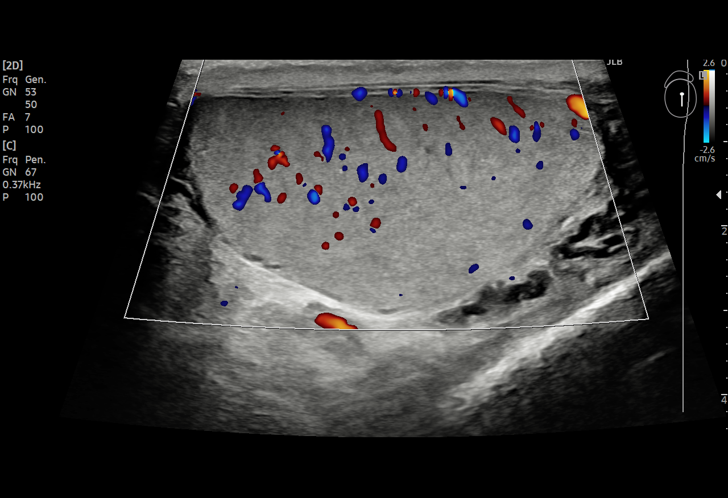
[im 55/70]
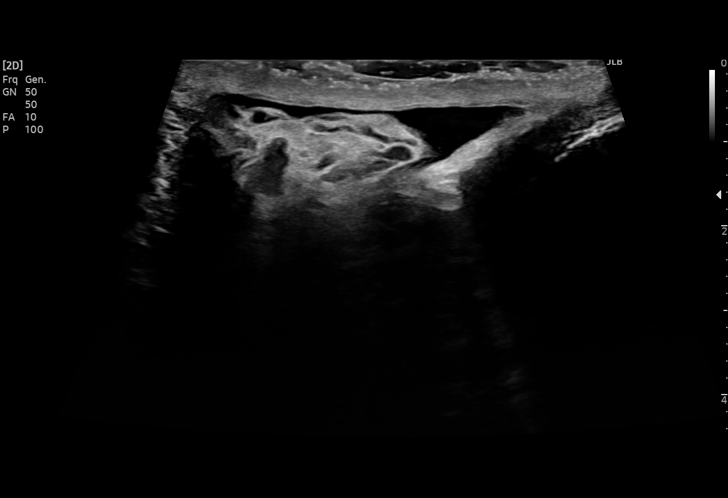
[im 58/70]
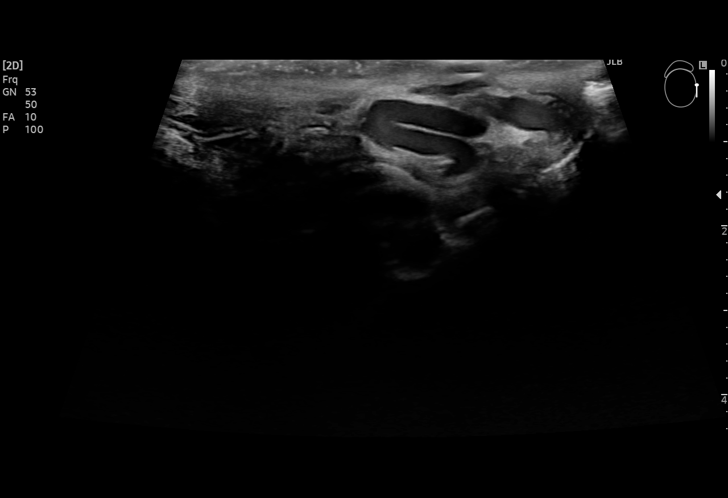
[im 64/70]
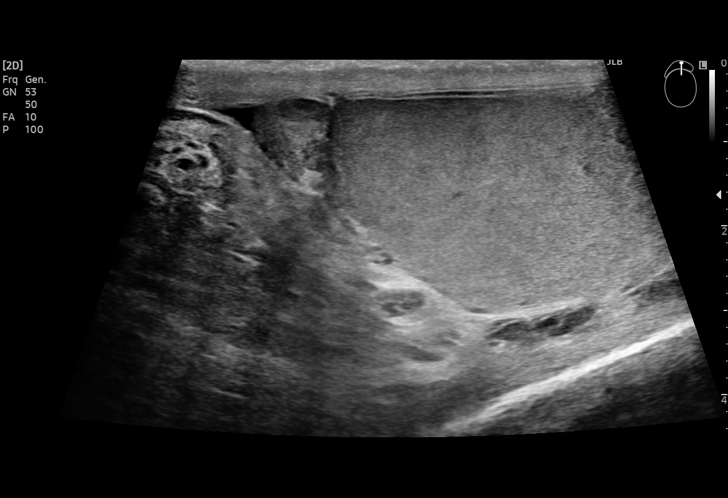
[im 70/70]
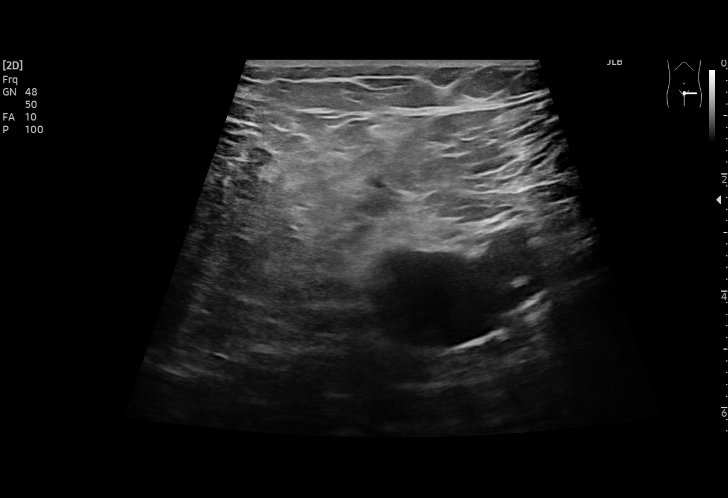

[15 of 25 positions shown; findings below may reference images not displayed]

FINDINGS: Right testicle

Measurements: 5.0 x 2.8 x 3.4 cm. No mass or microlithiasis
visualized.

Left testicle

Measurements: 4.9 x 2.7 x 3.0 cm. No mass or microlithiasis
visualized.

Right epididymis:  Normal in size and appearance.

Left epididymis:  Normal in size and appearance.

Hydrocele:  Small left and trace right hydrocele.

Varicocele:  Bilateral varicocele.

Pulsed Doppler interrogation of both testes demonstrates normal low
resistance arterial and venous waveforms bilaterally.
IMPRESSION: 1. Negative for testicular torsion or intratesticular mass.
2. Bilateral varicocele.
3. Small left and trace right hydroceles.

## 2019-12-25 IMAGING — CT CT ABD-PELV W/ CM
2 of 5 series · 14 of 46 positions shown, 16 images · IV contrast (APPLIED)
Comparison: CT [DATE], scrotal ultrasound [DATE]

CLINICAL DATA: Several days of right testicular pain

EXAM:
CT ABDOMEN AND PELVIS WITH CONTRAST
TECHNIQUE: Multidetector CT imaging of the abdomen and pelvis was performed
using the standard protocol following bolus administration of
intravenous contrast.
CONTRAST:  125mL OMNIPAQUE IOHEXOL 300 MG/ML  SOLN

[Series 2: routine abd/pel with (person_name) · axial · 0.87mm/px · z∈[-1057,-547]mm · 11 of 114 slices shown, 13 images]
[im 6/114  soft-tissue]
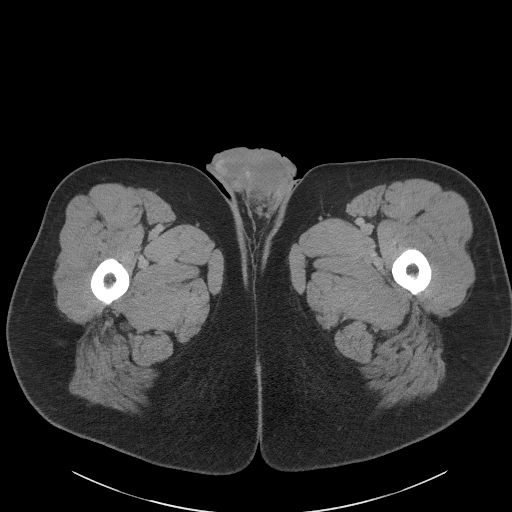
[im 6/114  bone]
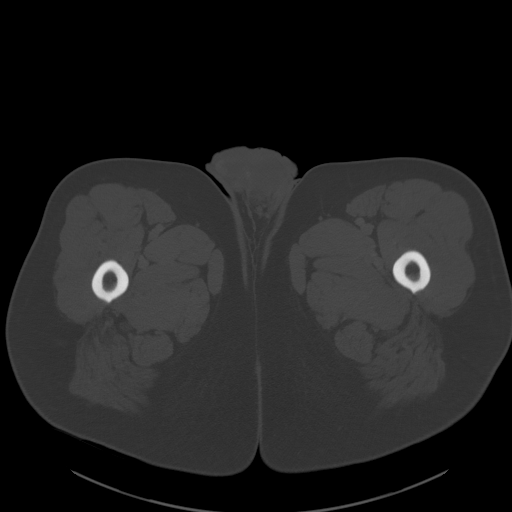
[im 18/114  soft-tissue]
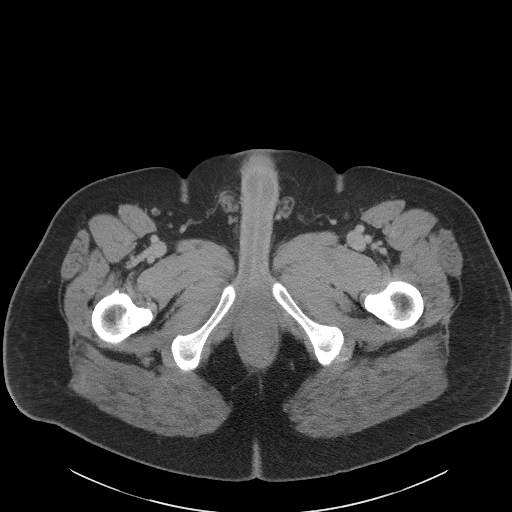
[im 30/114  soft-tissue]
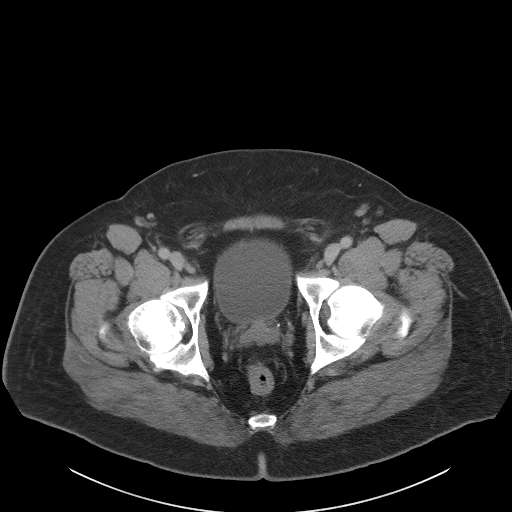
[im 36/114  soft-tissue]
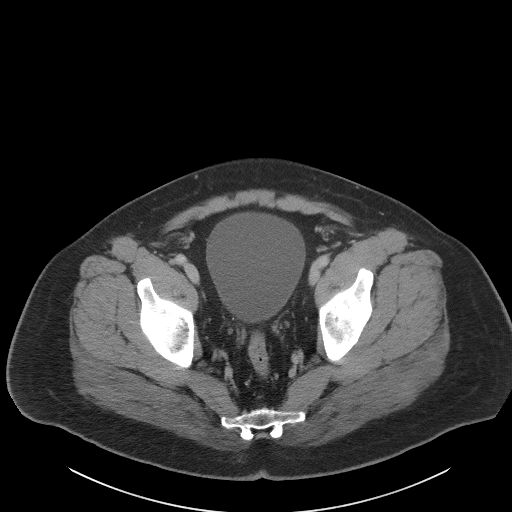
[im 48/114  soft-tissue]
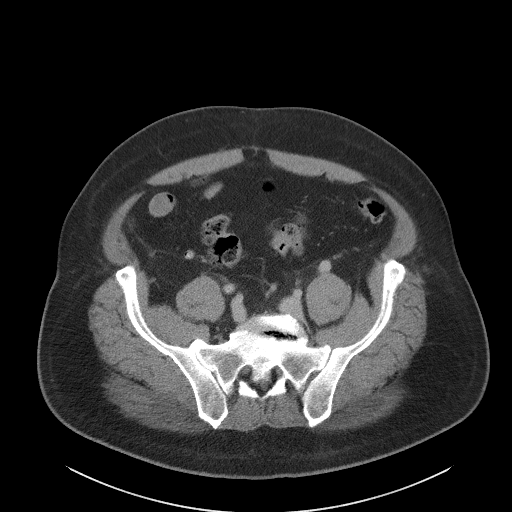
[im 60/114  soft-tissue]
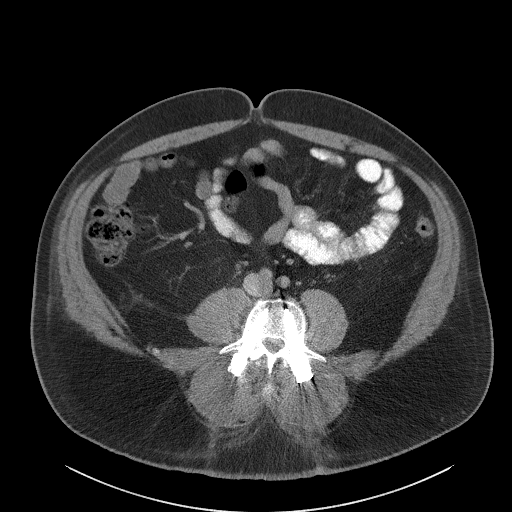
[im 66/114  soft-tissue]
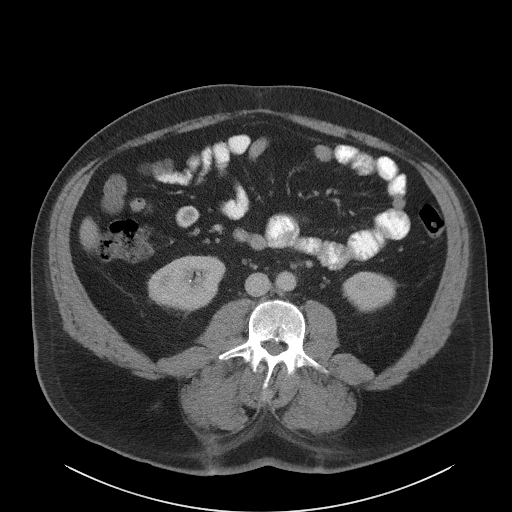
[im 78/114  soft-tissue]
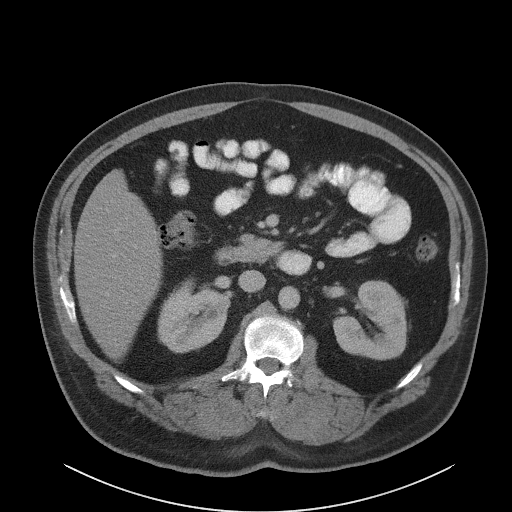
[im 84/114  soft-tissue]
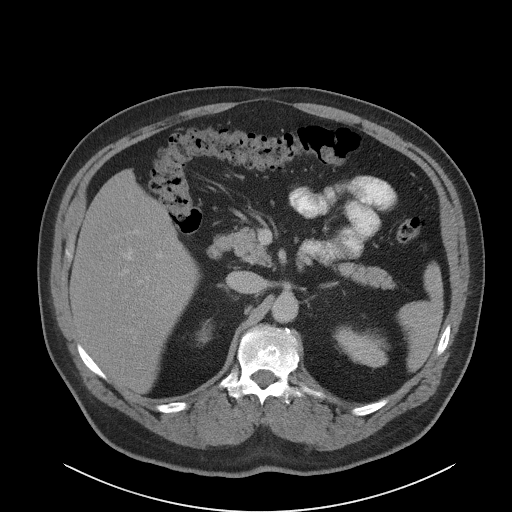
[im 84/114  bone]
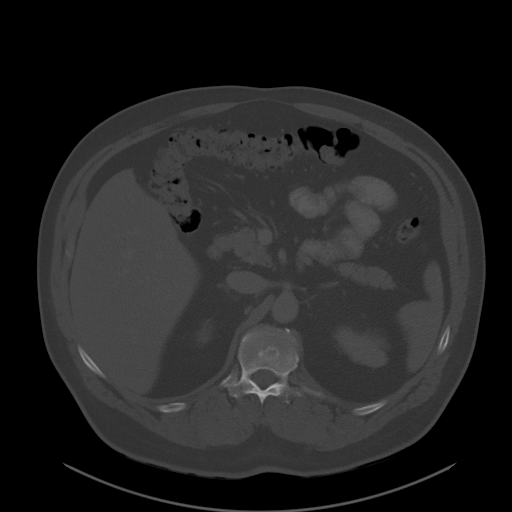
[im 96/114  soft-tissue]
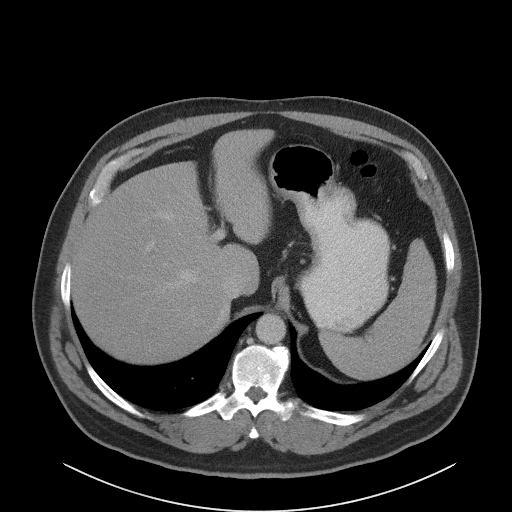
[im 108/114  soft-tissue]
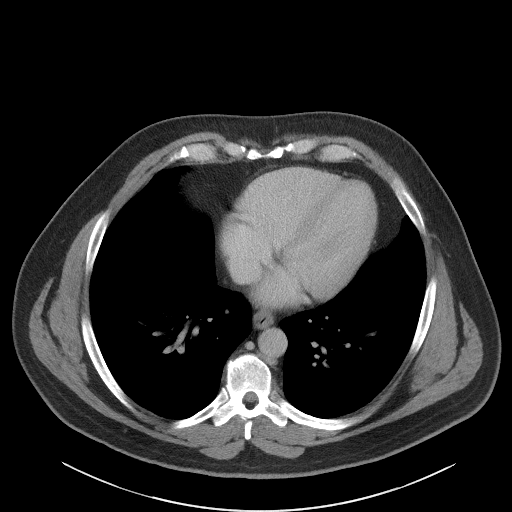

[Series 5: coronal st · coronal · 0.86mm/px · 3 of 105 slices shown]
[im 35/105  soft-tissue]
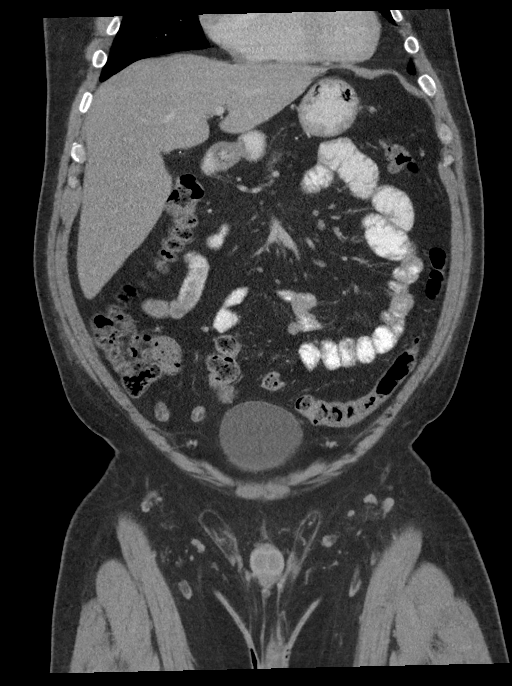
[im 47/105  soft-tissue]
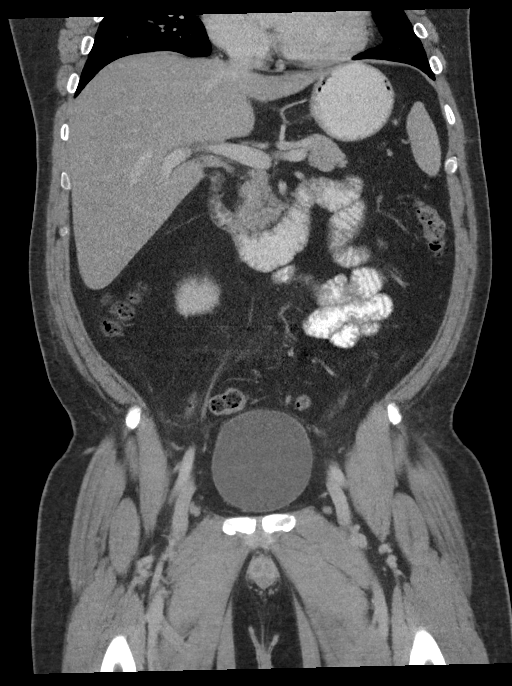
[im 58/105  soft-tissue]
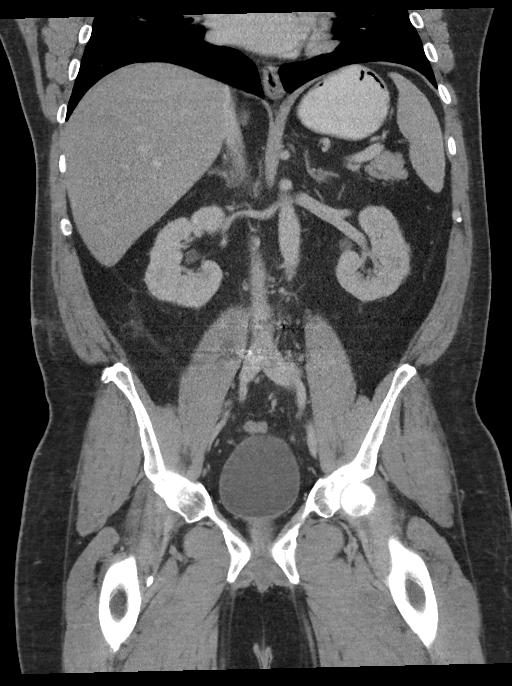

[14 of 46 positions shown; findings below may reference images not displayed]

FINDINGS: Lower chest: Lung bases are clear. Normal heart size. No pericardial
effusion.

Hepatobiliary: Diffuse hepatic hypoattenuation compatible with
hepatic steatosis. No worrisome focal liver lesions. Smooth liver
surface contour. Post cholecystectomy. No biliary ductal dilatation
or intraductal gallstones.

Pancreas: No pancreatic ductal dilatation or surrounding
inflammatory changes.

Spleen: Normal in size. No concerning splenic lesions.

Adrenals/Urinary Tract: Normal adrenal glands. Kidneys enhance and
excrete symmetrically. Few small subcentimeter hypoattenuating foci
too small to fully characterize on CT imaging but statistically
likely benign. Slightly larger fluid attenuation cyst seen in the
upper pole left kidney measuring up to 2.1 cm in size. No concerning
renal mass. Punctate bilateral nonobstructing calculi in the renal
pelves. There is some asymmetric right periureteral stranding though
this is in the vicinity of a an ill-defined hypoattenuating
collection within the right psoas musculature and may therefore be
reactive. Urinary bladder is unremarkable.

Stomach/Bowel: Distal esophagus, stomach and duodenal sweep are
unremarkable. No small bowel wall thickening or dilatation. No
evidence of obstruction. A normal appendix is visualized. No colonic
dilatation or wall thickening. Scattered colonic diverticula without
focal inflammation to suggest diverticulitis.

Vascular/Lymphatic: No significant vascular findings are present.
Few prominent retroperitoneal lymph nodes adjacent the psoas
musculature on the right. No pathologically enlarged abdominopelvic
nodes.

Reproductive: The prostate and seminal vesicles are unremarkable.
Bilateral varicoceles, better detailed on scrotal ultrasound.

Other: Some mild retroperitoneal stranding and thickening in the
right paracolic gutter. Postsurgical changes of the soft tissues
posteriorly as well as along the right lateral aspect of the
abdominal wall.

Musculoskeletal: Ill-defined, possibly rim enhancing collection
within the right psoas muscle measuring approximately 2.9 x 2.4 x
5.9 cm. Minimal stranding adjacent the psoas musculature. This is in
the immediate vicinity of the patient's L4-5 posterior decompression
fusion with interbody spacer placement. Hardware infection cannot be
fully excluded particularly in the setting of leukocytosis. No other
acute musculoskeletal abnormality is seen. Multilevel degenerative
changes the spine, hips and pelvis.
IMPRESSION: 1. Ill-defined, possibly rim enhancing collection within the right
psoas muscle measuring approximately 2.9 x 2.4 x 5.9 cm. This is in
the immediate vicinity of the patient's L4-5 posterior decompression
fusion with interbody spacer placement. Appearance could raise
concern for an intramuscular abscess versus hematoma/seroma.
Hardware infection cannot be fully excluded particularly in the
setting of leukocytosis. Recommend correlation with patient's
symptoms and further investigation with contrast-enhanced lumbar
MRI.
2. Bilateral nonobstructing nephrolithiasis. Some mild periureteral
stranding is favored to be reactive given the adjacent psoas process
though recently passed calculus or urinary tract infection could
present similarly. Recommend correlation with urinalysis.
3. Few prominent retroperitoneal lymph nodes adjacent the psoas
musculature on the right, likely reactive.
4. Hepatic steatosis.
5. Post cholecystectomy.
6. Colonic diverticulosis without evidence of diverticulitis.

These results were called by telephone at the time of interpretation
on [DATE] at [DATE] to provider Dr. AUJLA, who verbally
acknowledged these results.

## 2019-12-25 IMAGING — MR MR LUMBAR SPINE WO/W CM
6 of 7 series · 31 of 48 positions shown · IV contrast (gadavist)
Comparison: CT abdomen pelvis [DATE]

CLINICAL DATA: Recent spinal surgery.  Possible size abscess.

EXAM:
MRI LUMBAR SPINE WITHOUT AND WITH CONTRAST
TECHNIQUE: Multiplanar and multiecho pulse sequences of the lumbar spine were
obtained without and with intravenous contrast.
CONTRAST:  10mL GADAVIST GADOBUTROL 1 MMOL/ML IV SOLN

[Series 5: T2 · sagittal · 4.0mm · 0.88mm/px · 4 of 17 slices shown (1 of 2)]
[im 1/17]
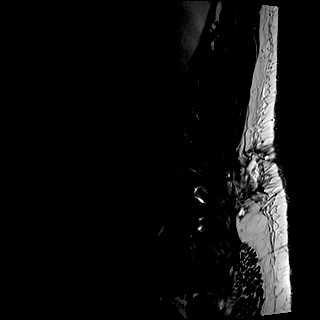
[im 6/17]
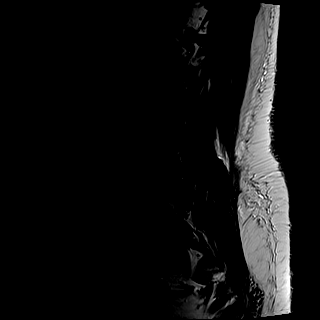
[im 11/17]
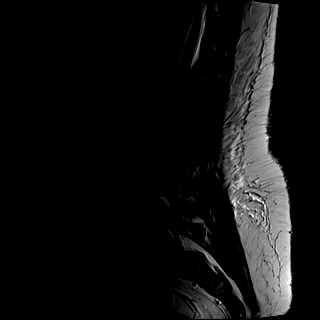
[im 17/17]
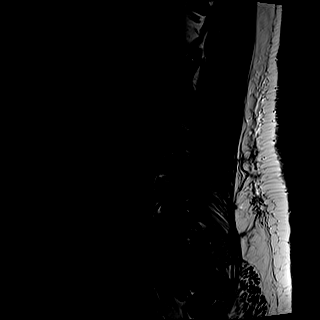

[Series 6: T1 · sagittal · 4.0mm · 0.88mm/px · 4 of 17 slices shown (1 of 2)]
[im 1/17]
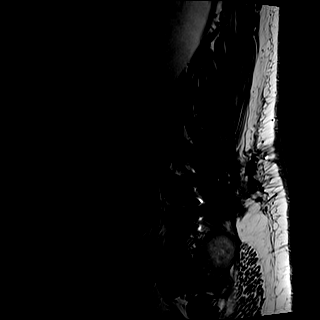
[im 6/17]
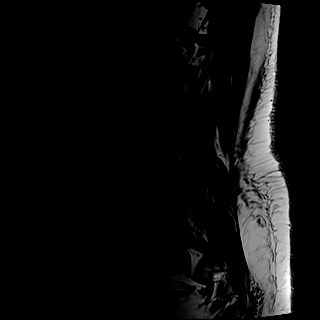
[im 11/17]
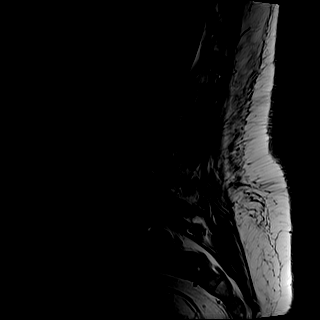
[im 17/17]
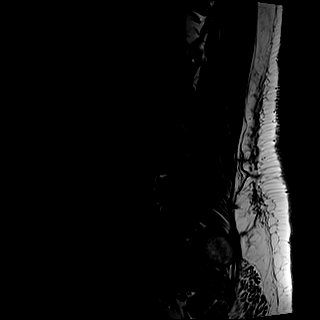

[Series 7: STIR · sagittal · 4.0mm · 0.44mm/px · 2 of 17 slices shown]
[im 1/17]
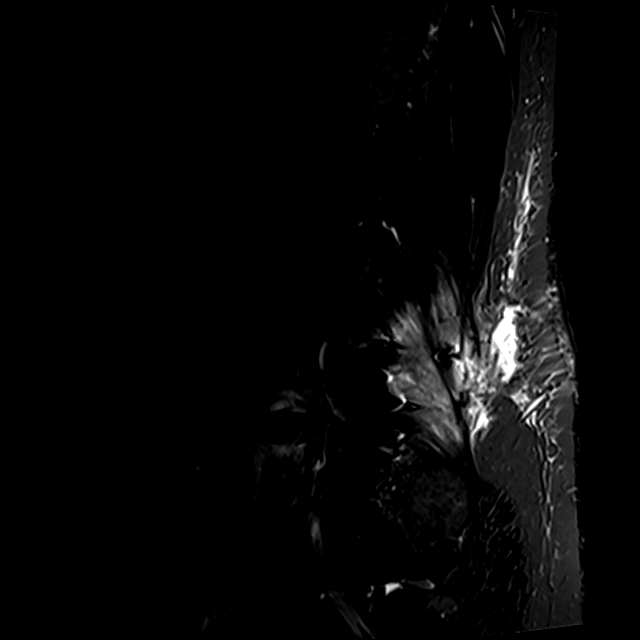
[im 5/17]
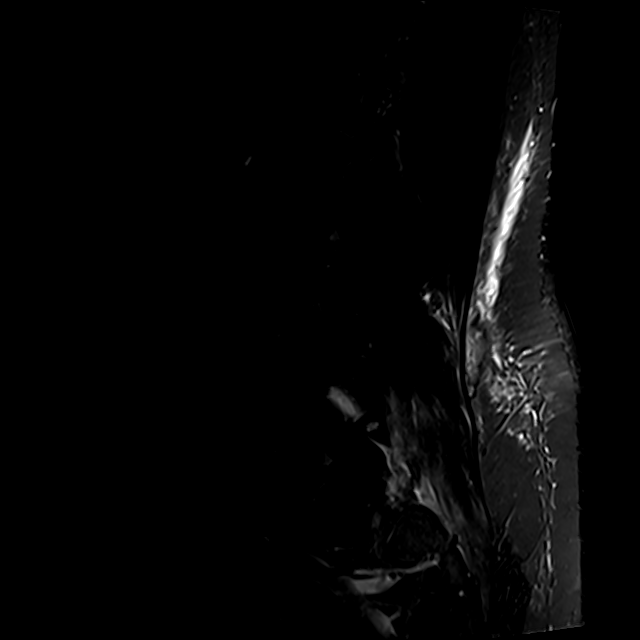

[Series 8: T2 · axial · 4.0mm · 0.78mm/px · z∈[-151,+53]mm · 8 of 36 slices shown (2 of 2)]
[im 1/36]
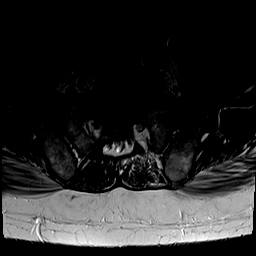
[im 4/36]
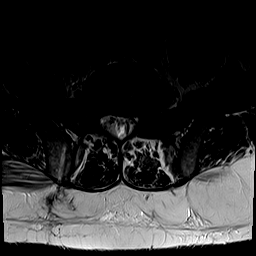
[im 12/36]
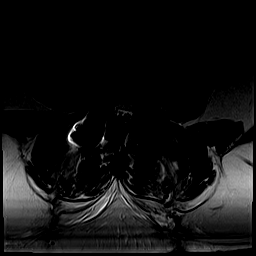
[im 16/36]
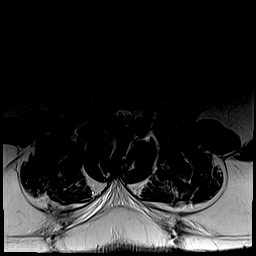
[im 20/36]
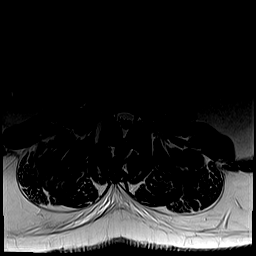
[im 24/36]
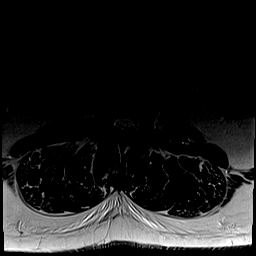
[im 32/36]
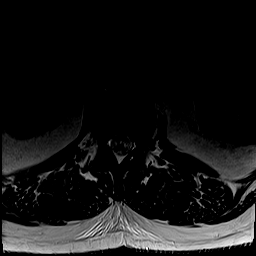
[im 36/36]
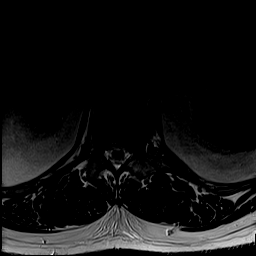

[Series 9: T1 · axial · 4.0mm · 0.39mm/px · z∈[-151,+53]mm · 8 of 36 slices shown (2 of 2)]
[im 1/36]
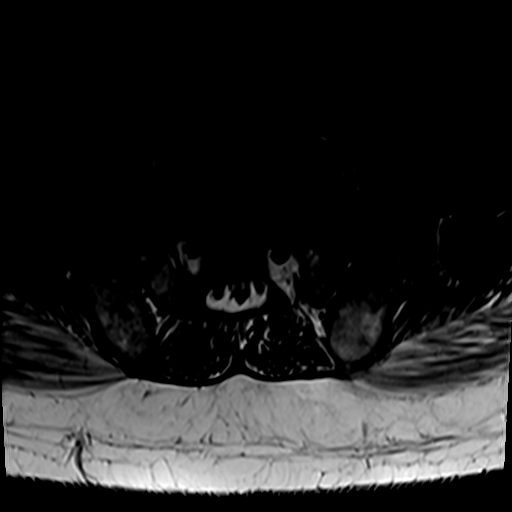
[im 4/36]
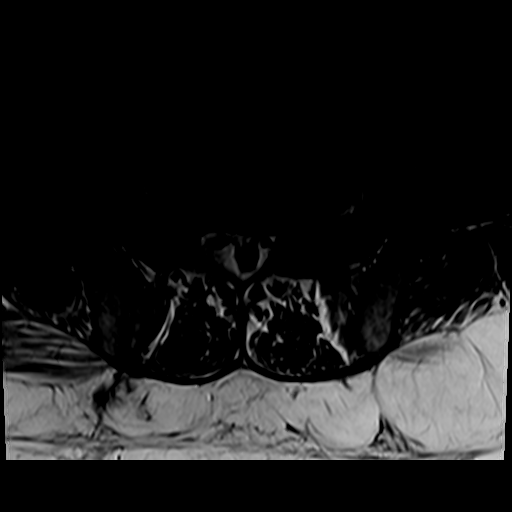
[im 12/36]
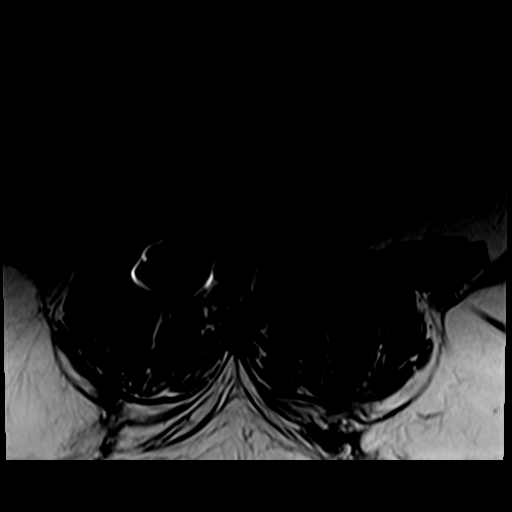
[im 16/36]
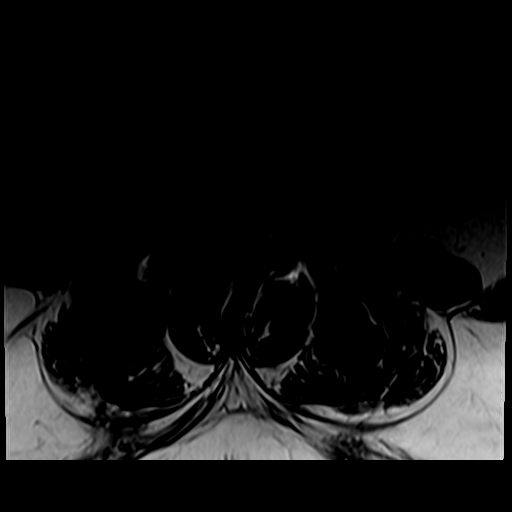
[im 20/36]
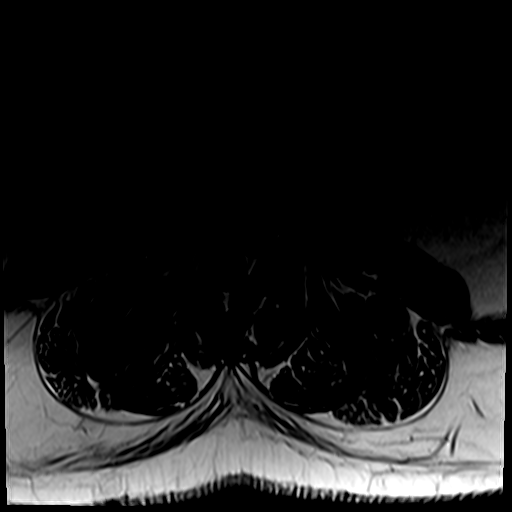
[im 24/36]
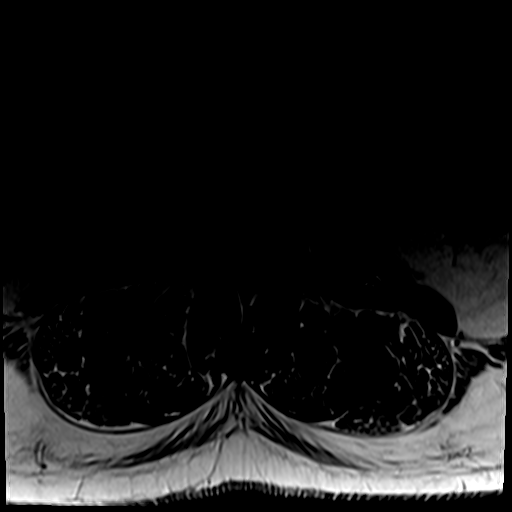
[im 32/36]
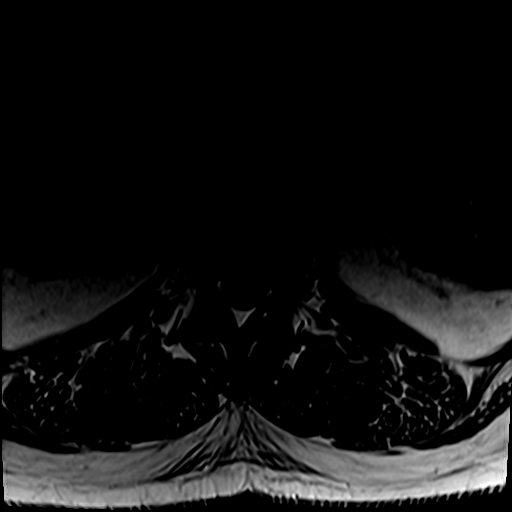
[im 36/36]
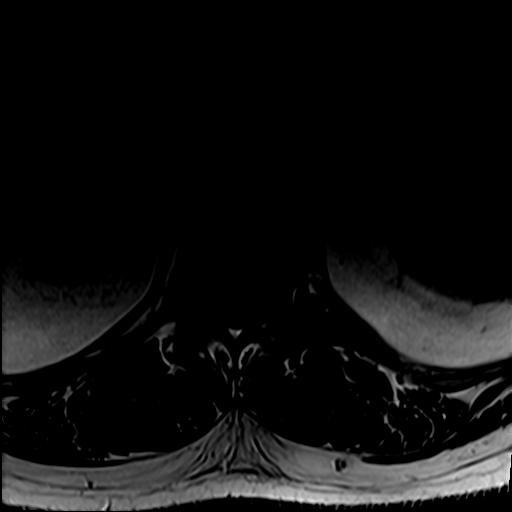

[Series 10: T1 fat-sat post-contrast · sagittal · 4.0mm · 0.88mm/px · 5 of 19 slices shown]
[im 1/19]
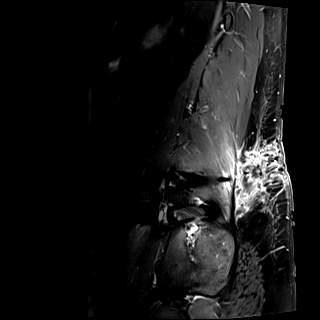
[im 5/19]
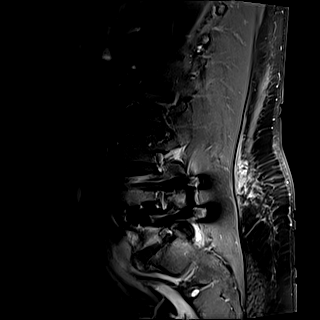
[im 10/19]
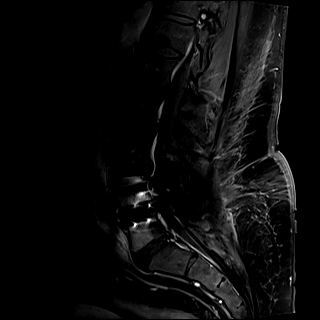
[im 14/19]
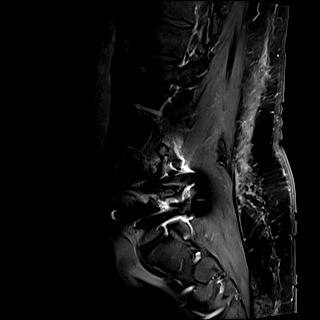
[im 19/19]
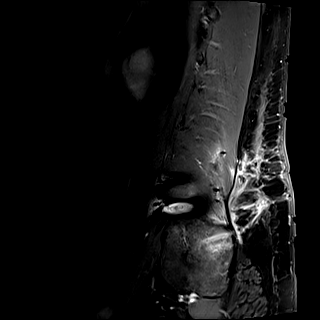

[31 of 48 positions shown; findings below may reference images not displayed]

FINDINGS: Segmentation:  Standard

Alignment:  Normal

Vertebrae:  L4-5 PLIF.  No abnormal bone marrow signal.

Conus medullaris and cauda equina: Conus extends to the L1 level.
Conus and cauda equina appear normal.

Paraspinal and other soft tissues: Fluid collection in the right
psoas muscle measures 2.0 x 2.7 cm. There is mild peripheral
enhancement.

Disc levels:

L1-L2: Normal disc space and facet joints. No spinal canal stenosis.
No neural foraminal stenosis.

L2-L3: Normal disc space and facet joints. No spinal canal stenosis.
No neural foraminal stenosis.

L3-L4: Normal disc space and facet joints. No spinal canal stenosis.
No neural foraminal stenosis.

L4-L5: Disc bulge with facet hypertrophy and large amount of fluid
in the facets, worsened since [DATE]. Unchanged moderate spinal
canal stenosis. Unchanged moderate bilateral neural foraminal
stenosis. There is abnormal contrast enhancement at the articular
surfaces of the L4-5 facet joints.

L5-S1: Normal disc space and facet joints. No spinal canal stenosis.
No neural foraminal stenosis.

Visualized sacrum: Normal.
IMPRESSION: 1. Medial right psoas muscle abscess at the L4-5 level.
2. Fluid-filled and abnormally enhancing facet joints at L4-5, which
may indicate septic arthritis.
3. Unchanged moderate L4-5 spinal canal and moderate bilateral
neural foraminal stenosis.

## 2019-12-25 MED ORDER — ALBUTEROL SULFATE HFA 108 (90 BASE) MCG/ACT IN AERS
2.0000 | INHALATION_SPRAY | RESPIRATORY_TRACT | Status: DC | PRN
  Filled 2019-12-25: qty 6.7

## 2019-12-25 MED ORDER — PANTOPRAZOLE SODIUM 40 MG PO TBEC: 40.0000 mg | DELAYED_RELEASE_TABLET | Freq: Two times a day (BID) | ORAL | Status: DC

## 2019-12-25 MED ORDER — GABAPENTIN 300 MG PO CAPS: 300.0000 mg | ORAL_CAPSULE | Freq: Three times a day (TID) | ORAL | Status: DC

## 2019-12-25 MED ORDER — PAROXETINE HCL 30 MG PO TABS
15.0000 mg | ORAL_TABLET | Freq: Every day | ORAL | Status: DC
  Administered 2019-12-26: 15 mg via ORAL
  Filled 2019-12-25 (×2): qty 0.5

## 2019-12-25 MED ORDER — MORPHINE SULFATE (PF) 4 MG/ML IV SOLN
4.0000 mg | Freq: Once | INTRAVENOUS | Status: AC
  Administered 2019-12-25: 4 mg via INTRAVENOUS
  Filled 2019-12-25: qty 1

## 2019-12-25 MED ORDER — CALCIUM CARBONATE-VITAMIN D 500-200 MG-UNIT PO TABS
1.0000 | ORAL_TABLET | Freq: Two times a day (BID) | ORAL | Status: DC
  Filled 2019-12-25 (×2): qty 1

## 2019-12-25 MED ORDER — FLUTICASONE PROPIONATE HFA 220 MCG/ACT IN AERO
2.0000 | INHALATION_SPRAY | Freq: Two times a day (BID) | RESPIRATORY_TRACT | Status: DC | PRN
  Filled 2019-12-25: qty 12

## 2019-12-25 MED ORDER — SODIUM CHLORIDE 0.9 % IV SOLN: INTRAVENOUS | Status: DC

## 2019-12-25 MED ORDER — GADOBUTROL 1 MMOL/ML IV SOLN
10.0000 mL | Freq: Once | INTRAVENOUS | Status: AC | PRN
  Administered 2019-12-25: 10 mL via INTRAVENOUS

## 2019-12-25 MED ORDER — HYDROCODONE-ACETAMINOPHEN 5-325 MG PO TABS: 1.0000 | ORAL_TABLET | Freq: Four times a day (QID) | ORAL | Status: DC | PRN

## 2019-12-25 MED ORDER — BUDESONIDE 0.5 MG/2ML IN SUSP: 0.5000 mg | Freq: Two times a day (BID) | RESPIRATORY_TRACT | Status: DC

## 2019-12-25 MED ORDER — LORAZEPAM 2 MG/ML IJ SOLN: 1.0000 mg | Freq: Once | INTRAMUSCULAR | Status: DC

## 2019-12-25 MED ORDER — VITAMIN B-12 1000 MCG PO TABS
1500.0000 ug | ORAL_TABLET | Freq: Every day | ORAL | Status: DC
  Filled 2019-12-25: qty 1

## 2019-12-25 MED ORDER — SODIUM CHLORIDE 0.9 % IV BOLUS
1000.0000 mL | Freq: Once | INTRAVENOUS | Status: AC
  Administered 2019-12-25: 1000 mL via INTRAVENOUS

## 2019-12-25 MED ORDER — IOHEXOL 9 MG/ML PO SOLN
1000.0000 mL | Freq: Once | ORAL | Status: AC
  Administered 2019-12-25: 1000 mL via ORAL

## 2019-12-25 MED ORDER — IOHEXOL 300 MG/ML  SOLN
125.0000 mL | Freq: Once | INTRAMUSCULAR | Status: AC | PRN
  Administered 2019-12-25: 125 mL via INTRAVENOUS

## 2019-12-25 MED ORDER — ADULT MULTIVITAMIN W/MINERALS CH: 1.0000 | ORAL_TABLET | Freq: Every day | ORAL | Status: DC

## 2019-12-25 MED ORDER — METHOCARBAMOL 500 MG PO TABS
500.0000 mg | ORAL_TABLET | Freq: Four times a day (QID) | ORAL | Status: DC | PRN
  Filled 2019-12-25: qty 1

## 2019-12-25 MED ORDER — ONDANSETRON HCL 4 MG/2ML IJ SOLN
4.0000 mg | Freq: Once | INTRAMUSCULAR | Status: AC
  Administered 2019-12-25: 4 mg via INTRAVENOUS
  Filled 2019-12-25: qty 2

## 2019-12-25 MED ORDER — TOPIRAMATE 25 MG PO TABS
25.0000 mg | ORAL_TABLET | Freq: Two times a day (BID) | ORAL | Status: DC
  Administered 2019-12-26: 25 mg via ORAL
  Filled 2019-12-25: qty 1

## 2019-12-25 MED ORDER — DOCUSATE SODIUM 100 MG PO CAPS: 100.0000 mg | ORAL_CAPSULE | Freq: Two times a day (BID) | ORAL | Status: DC

## 2019-12-25 MED ORDER — MORPHINE SULFATE (PF) 2 MG/ML IV SOLN
2.0000 mg | INTRAVENOUS | Status: DC | PRN
  Administered 2019-12-26: 2 mg via INTRAVENOUS
  Filled 2019-12-25 (×2): qty 1

## 2019-12-25 MED ORDER — ONDANSETRON 4 MG PO TBDP: 4.0000 mg | ORAL_TABLET | Freq: Three times a day (TID) | ORAL | Status: DC | PRN

## 2019-12-25 NOTE — ED Triage Notes (Signed)
Pt sent from [facility] for eval of testicular pain.  Pt had back surgery 12/11/19.  Pt had right groin pain from surgery.  Today, pt has right testicle pain.   No urinary sx.  Pt alert  Speech clear

## 2019-12-25 NOTE — ED Notes (Signed)
MD at bedside to discuss MRI results  Pt denies further needs at this time

## 2019-12-25 NOTE — ED Notes (Signed)
Pt reports at 1230 he took a prescribed 5mg  oxycodone. Pt reports he has been taking pain medications following the back surgery

## 2019-12-25 NOTE — ED Notes (Signed)
Pt with back surgery on 10/12. Pt reports right sided testicle pain beginning on 10/24, with progressive worsening. Pt reports significant tenderness and mild swelling.

## 2019-12-25 NOTE — ED Provider Notes (Signed)
[facility] Emergency Department Provider Note  Time seen: 7:26 PM  I have reviewed the triage vital signs and the nursing notes.   HISTORY  Chief Complaint Testicle Pain   HPI Manuel Cain is a 49 y.o. male with a past medical history anxiety, hypertension, recent back surgery 12/11/2019 presents to the emergency department for several days of right testicular pain.  According to the patient he had a surgery performed approximately 2 weeks ago however over the past 3 days he has been experiencing significant testicular pain on the right testicle worse with moving or if he touches the area.  Patient was seen for routine follow-up by a spinal surgeon today and they referred him to the emergency department for further work-up.  Patient denies any fever.  No dysuria.  No vomiting or diarrhea.  Patient has been taking his postoperative pain medication with some relief of the discomfort currently rates it as a 5/10.   Past Medical History:  Diagnosis Date  . Anxiety   . Blood in stool    dark pebble like per pt  x4 wks  . Chest pain    on going per pt-x 12mo  . Chronic ankle pain    Right, s/p DTO(6712)  . Chronic hip pain    right, s/p WPY(0998)  . Curvature of spine   . Dyspnea   . Esophagitis   . Frequent nosebleeds    x 2 mo per pt (march 2018)  . GERD (gastroesophageal reflux disease)   . Head injury 04/2016      . Hemorrhoids   . Hypertension   . Memory changes    x 1 yr-since 2017  . Migraine    since 2013  . Vasovagal syncope     Patient Active Problem List   Diagnosis Date Noted  . Chronic nonintractable headache 07/15/2018  . Acute upper respiratory infection 05/25/2018  . Cough 05/25/2018  . B12 deficiency 11/04/2017  . Idiopathic peripheral neuropathy 11/03/2017  . Sexual dysfunction 04/18/2017  . Situational anxiety 04/18/2017  . Hematochezia   . Benign neoplasm of transverse colon   . Rectal polyp   . Gastroesophageal reflux  disease   . Vaccine counseling 06/10/2016  . Essential hypertension 10/25/2015  . Morbid obesity with BMI of 40.0-44.9, adult ([employer]) 10/25/2015  . Leukocytosis 10/25/2015  . Chest pain, unspecified 10/24/2015    Past Surgical History:  Procedure Laterality Date  . CHOLECYSTECTOMY N/A 08/23/2018   Procedure: LAPAROSCOPIC CHOLECYSTECTOMY;  Surgeon: Herbert Pun, MD;  Location: [facility];  Service: General;  Laterality: N/A;  . COLONOSCOPY WITH PROPOFOL N/A 12/16/2016   Procedure: COLONOSCOPY WITH PROPOFOL;  Surgeon: Lucilla Lame, MD;  Location: [place];  Service: Endoscopy;  Laterality: N/A;  . ESOPHAGOGASTRODUODENOSCOPY (EGD) WITH PROPOFOL N/A 12/16/2016   Procedure: ESOPHAGOGASTRODUODENOSCOPY (EGD) WITH PROPOFOL;  Surgeon: Lucilla Lame, MD;  Location: [place];  Service: Endoscopy;  Laterality: N/A;  . ESOPHAGOGASTRODUODENOSCOPY (EGD) WITH PROPOFOL N/A 08/09/2018   Procedure: ESOPHAGOGASTRODUODENOSCOPY (EGD) WITH PROPOFOL;  Surgeon: Toledo, Benay Pike, MD;  Location: [facility];  Service: Gastroenterology;  Laterality: N/A;  . KNEE SURGERY Left   . POLYPECTOMY  12/16/2016   Procedure: POLYPECTOMY;  Surgeon: Lucilla Lame, MD;  Location: [place];  Service: Endoscopy;;  . vastectomy  1999    Prior to Admission medications   Medication Sig Start Date End Date Taking? Authorizing Provider  albuterol (PROVENTIL HFA;VENTOLIN HFA) 108 (90 Base) MCG/ACT inhaler Inhale 2 puffs into the lungs  every 4 (four) hours as needed for shortness of breath (or chest tightness). 06/02/18   Arta Silence, MD  budesonide (PULMICORT) 0.5 MG/2ML nebulizer solution Take 0.5 mg by nebulization in the morning and at bedtime.  08/21/19   [provider]  Calcium-Vitamin D-Vitamin K (VIACTIV CALCIUM PLUS D) 650-12.5-40 MG-MCG-MCG CHEW Chew 1 each by mouth in the morning and at bedtime.    [provider]  fluticasone (FLOVENT HFA) 220 MCG/ACT inhaler  Inhale 2 puffs into the lungs 2 (two) times daily as needed (asthma).     [provider]  HYDROcodone-acetaminophen (NORCO) 5-325 MG tablet Take 1 tablet by mouth 3 (three) times daily as needed. 09/07/19   Menshew, Dannielle Karvonen, PA-C  Multiple Vitamins-Minerals (CENTRUM SILVER 50+MEN PO) Take 1 tablet by mouth daily.     [provider]  ondansetron (ZOFRAN ODT) 4 MG disintegrating tablet Take 1 tablet (4 mg total) by mouth every 8 (eight) hours as needed. 09/07/19   Menshew, Dannielle Karvonen, PA-C  pantoprazole (PROTONIX) 20 MG tablet Take 40 mg by mouth 2 (two) times daily before a meal.  07/25/19   [provider]  PARoxetine (PAXIL) 10 MG tablet Take 15 mg by mouth at bedtime.     [provider]  topiramate (TOPAMAX) 50 MG tablet Take 25 mg by mouth 2 (two) times daily.    [provider]  vitamin B-12 (CYANOCOBALAMIN) 500 MCG tablet Take 1,500 mcg by mouth daily.     [provider]    Allergies  Allergen Reactions  . Valium [Diazepam] Other (See Comments)    convulsions    Family History  Problem Relation Age of Onset  . Hypertension Mother   . Breast cancer Mother   . CAD Father   . Heart attack Father   . Bladder Cancer Brother     Social History Social History   Tobacco Use  . Smoking status: Never Smoker  . Smokeless tobacco: Never Used  Vaping Use  . Vaping Use: Never used  Substance Use Topics  . Alcohol use: No  . Drug use: No    Review of Systems Constitutional: Negative for fever. Cardiovascular: Negative for chest pain. Respiratory: Negative for shortness of breath. Gastrointestinal: Right lower quadrant abdominal pain. Genitourinary: Negative for urinary compaints.  Right testicular pain. Musculoskeletal: Negative for musculoskeletal complaints Skin: Negative for skin complaints  Neurological: Negative for headache All other ROS negative  ____________________________________________   PHYSICAL  EXAM:  VITAL SIGNS: ED Triage Vitals [12/25/19 1546]  Enc Vitals Group     BP 136/80     Pulse Rate 66     Resp 20     Temp 98.8 F (37.1 C)     Temp Source Oral     SpO2 98 %     Weight 255 lb (115.7 kg)     Height 5\' 9"  (1.753 m)     Head Circumference      Peak Flow      Pain Score 8     Pain Loc      Pain Edu?      Excl. in [place]?    Constitutional: Alert and oriented. Well appearing and in no distress. Eyes: Normal exam ENT      Head: Normocephalic and atraumatic.      Mouth/Throat: Mucous membranes are moist. Cardiovascular: Normal rate, regular rhythm.  Respiratory: Normal respiratory effort without tachypnea nor retractions. Breath sounds are clear  Gastrointestinal: Soft, moderate right lower  quadrant tenderness palpation.  No rebound guarding or distention. GU: Moderate tenderness palpation of the right testicle and superior to the right testicle.  No masses or tumors palpated.  No obvious hernia sac palpated.  No scrotal erythema or significant swelling noted. Musculoskeletal: Nontender with normal range of motion in all extremities. No lower extremity tenderness or edema. Neurologic:  Normal speech and language. No gross focal neurologic deficits are appreciated. Skin:  Skin is warm, dry and intact.  Psychiatric: Mood and affect are normal. Speech and behavior are normal.      RADIOLOGY  Ultrasound is negative for torsion.  There is bilateral varicoceles and a small left and right hydrocele.  ____________________________________________   INITIAL IMPRESSION / ASSESSMENT AND PLAN / ED COURSE  Pertinent labs & imaging results that were available during my care of the patient were reviewed by me and considered in my medical decision making (see chart for details).   Patient presents emergency department for right-sided abdominal pain as well as right testicular pain.  Moderate tenderness to palpation of the right testicle and just superior to the right testicle.   Patient did have recent back surgery 2 weeks ago per states the pain only started 3 to 4 days ago.  Given the patient's pain and ultrasound was ordered however it is negative for torsion.  There is bilateral varicocele/hydrocele which could be the cause of some discomfort.  Given the patient's worsening pain however we will proceed with a CT scan abdomen/pelvis to further evaluate.  Patient agreeable to plan of care.  We will treat pain and nausea while awaiting CT results.  CT pending.  Patient care signed out to oncoming physician.  JESS TONEY was evaluated in Emergency Department on 12/25/2019 for the symptoms described in the history of present illness. He was evaluated in the context of the global COVID-19 pandemic, which necessitated consideration that the patient might be at risk for infection with the SARS-CoV-2 virus that causes COVID-19. Institutional protocols and algorithms that pertain to the evaluation of patients at risk for COVID-19 are in a state of rapid change based on information released by regulatory bodies including the CDC and federal and state organizations. These policies and algorithms were followed during the patient's care in the ED.  ____________________________________________   FINAL CLINICAL IMPRESSION(S) / ED DIAGNOSES  Abdominal pain Right testicular pain   Harvest Dark, MD 12/25/19 2002

## 2019-12-25 NOTE — ED Provider Notes (Signed)
Assumed care at 8 PM. Briefly, patient is here with R groin/testicular pain. He has b/l varicoceles and pain could be reactive 2/2 his recent back surgery (had anterior and posterior approach), but CT scan obtained and is pending 2/2 his recent surgery. Distal NVI in LE. UA without UTI. U/S shows no torsion or other abnormality.   MRI read as concerning for abscess, though clinically his history is more concerning for likely hematoma/post-op inflammation. Inflammatory markers sent. Discussed with Dr. Lacinda Axon who will admit for pain control, further work-up. Will hold on ABX at this time. BCx sent, CRP pending.   Manuel Bruce, MD 12/25/19 2337

## 2019-12-26 LAB — RESPIRATORY PANEL BY RT PCR (FLU A&B, COVID)
Influenza A by PCR: NEGATIVE
Influenza B by PCR: NEGATIVE
SARS Coronavirus 2 by RT PCR: NEGATIVE

## 2019-12-26 LAB — C-REACTIVE PROTEIN: CRP: 0.8 mg/dL (ref <1.0)

## 2019-12-26 NOTE — Discharge Summary (Signed)
Referring Physician:  No referring provider defined for this encounter.  Primary Physician:  Dion Body, MD  Chief Complaint:  R testicular pain  History of Present Illness: Manuel Cain is a 49 y.o. male who presents with the chief complaint of R testicular pain. He is s/p L4-5 XLIF on 12/11/2019. He presented to clinic yesterday afternoon for his routine postoperative check.  He did have R psoas weakness and R groin/thigh pain as well, this was discussed in detail with Dr. Izora Ribas and our plan was to send him to PT for his R psoas weakness and prescribe a medrol dosepak for his c/o of pain. We had no concern for any surgical infection.   He also endorsed severe R testicular pain and scrotal swelling. I reached out to his PCP Dr. Netty Starring who recommended pt present to [facility] ED to r/o testicular torsion.  While in ED, he had a testicular US which was negative for testicular torsion or intratesticular mass.  He also had an MRI of lumbar spine and CT abd pelvis which noted some post surgical inflammation.   Review of Systems:  A 10 point review of systems is negative, except for the pertinent positives and negatives detailed in the HPI.  Past Medical History:     Past Medical History:  Diagnosis Date  . Anxiety   . Blood in stool    dark pebble like per pt  x4 wks  . Chest pain    on going per pt-x 33mo . Chronic ankle pain    Right, s/p MCHE(5277  . Chronic hip pain    right, s/p [id number]  . Curvature of spine   . Dyspnea   . Esophagitis   . Frequent nosebleeds    x 2 mo per pt (march 2018)  . GERD (gastroesophageal reflux disease)   . Head injury 04/2016      . Hemorrhoids   . Hypertension   . Memory changes    x 1 yr-since 2017  . Migraine    since 2013  . Vasovagal syncope     Past Surgical History: Past Surgical History:  Procedure Laterality Date  . CHOLECYSTECTOMY N/A 08/23/2018   Procedure:  LAPAROSCOPIC CHOLECYSTECTOMY;  Surgeon: CHerbert Pun MD;  Location: [facility];  Service: General;  Laterality: N/A;  . COLONOSCOPY WITH PROPOFOL N/A 12/16/2016   Procedure: COLONOSCOPY WITH PROPOFOL;  Surgeon: WLucilla Lame MD;  [place]  Service: Endoscopy;  Laterality: N/A;  . ESOPHAGOGASTRODUODENOSCOPY (EGD) WITH PROPOFOL N/A 12/16/2016   Procedure: ESOPHAGOGASTRODUODENOSCOPY (EGD) WITH PROPOFOL;  Surgeon: WLucilla Lame MD;  [place]  Service: Endoscopy;  Laterality: N/A;  . ESOPHAGOGASTRODUODENOSCOPY (EGD) WITH PROPOFOL N/A 08/09/2018   Procedure: ESOPHAGOGASTRODUODENOSCOPY (EGD) WITH PROPOFOL;  Surgeon: Toledo, TBenay Pike MD;  Location: [facility];  Service: Gastroenterology;  Laterality: N/A;  . KNEE SURGERY Left   . POLYPECTOMY  12/16/2016   Procedure: POLYPECTOMY;  Surgeon: WLucilla Lame MD;  [place]  Service: Endoscopy;;  . vastectomy  1999    Allergies:      Allergies as of 12/25/2019 - Review Complete 12/25/2019  Allergen Reaction Noted  . Valium [diazepam] Other (See Comments) 06/25/2016    Medications:  Current Facility-Administered Medications:  .  0.9 %  sodium chloride infusion, , Intravenous, Continuous, CDeetta Perla MD, Last Rate: 75 mL/hr at 12/26/19 0101, New Bag at 12/26/19 0101 .  albuterol (VENTOLIN HFA) 108 (90 Base) MCG/ACT inhaler 2 puff, 2 puff, Inhalation, Q4H PRN, CLacinda Axon  Remo Lipps, MD .  budesonide (PULMICORT) nebulizer solution 0.5 mg, 0.5 mg, Nebulization, BID, Deetta Perla, MD .  calcium-vitamin D (OSCAL WITH D) 500-200 MG-UNIT per tablet 1 tablet, 1 tablet, Oral, BID, Deetta Perla, MD .  docusate sodium (COLACE) capsule 100 mg, 100 mg, Oral, BID, Deetta Perla, MD .  fluticasone (FLOVENT HFA) 220 MCG/ACT inhaler 2 puff, 2 puff, Inhalation, BID PRN, Deetta Perla, MD .  gabapentin (NEURONTIN) capsule 300 mg, 300 mg, Oral, TID, Deetta Perla, MD .   HYDROcodone-acetaminophen (NORCO/VICODIN) 5-325 MG per tablet 1-2 tablet, 1-2 tablet, Oral, Q6H PRN, Deetta Perla, MD .  methocarbamol (ROBAXIN) tablet 500 mg, 500 mg, Oral, Q6H PRN, Deetta Perla, MD .  morphine 2 MG/ML injection 2 mg, 2 mg, Intravenous, Q3H PRN, Deetta Perla, MD, 2 mg at 12/26/19 0355 .  multivitamin with minerals tablet 1 tablet, 1 tablet, Oral, Daily, Deetta Perla, MD .  ondansetron (ZOFRAN-ODT) disintegrating tablet 4 mg, 4 mg, Oral, Q8H PRN, Deetta Perla, MD .  pantoprazole (PROTONIX) EC tablet 40 mg, 40 mg, Oral, BID AC, Deetta Perla, MD .  PARoxetine (PAXIL) tablet 15 mg, 15 mg, Oral, QHS, Deetta Perla, MD, 15 mg at 12/26/19 0100 .  topiramate (TOPAMAX) tablet 25 mg, 25 mg, Oral, BID, Deetta Perla, MD, 25 mg at 12/26/19 0100 .  vitamin B-12 (CYANOCOBALAMIN) tablet 1,500 mcg, 1,500 mcg, Oral, Daily, Deetta Perla, MD  Current Outpatient Medications:  .  albuterol (PROVENTIL HFA;VENTOLIN HFA) 108 (90 Base) MCG/ACT inhaler, Inhale 2 puffs into the lungs every 4 (four) hours as needed for shortness of breath (or chest tightness)., Disp: 1 Inhaler, Rfl: 0 .  budesonide (PULMICORT) 0.5 MG/2ML nebulizer solution, Take 0.5 mg by nebulization in the morning and at bedtime. , Disp: , Rfl:  .  Calcium-Vitamin D-Vitamin K (VIACTIV CALCIUM PLUS D) 650-12.5-40 MG-MCG-MCG CHEW, Chew 1 each by mouth in the morning and at bedtime., Disp: , Rfl:  .  fluticasone (FLOVENT HFA) 220 MCG/ACT inhaler, Inhale 2 puffs into the lungs 2 (two) times daily as needed (asthma). , Disp: , Rfl:  .  HYDROcodone-acetaminophen (NORCO) 5-325 MG tablet, Take 1 tablet by mouth 3 (three) times daily as needed., Disp: 10 tablet, Rfl: 0 .  Multiple Vitamins-Minerals (CENTRUM SILVER 50+MEN PO), Take 1 tablet by mouth daily. , Disp: , Rfl:  .  ondansetron (ZOFRAN ODT) 4 MG disintegrating tablet, Take 1 tablet (4 mg total) by mouth every 8 (eight) hours as needed., Disp: 15 tablet, Rfl: 0 .  pantoprazole (PROTONIX) 20  MG tablet, Take 40 mg by mouth 2 (two) times daily before a meal. , Disp: , Rfl:  .  PARoxetine (PAXIL) 10 MG tablet, Take 15 mg by mouth at bedtime. , Disp: , Rfl:  .  topiramate (TOPAMAX) 50 MG tablet, Take 25 mg by mouth 2 (two) times daily., Disp: , Rfl:  .  vitamin B-12 (CYANOCOBALAMIN) 500 MCG tablet, Take 1,500 mcg by mouth daily. , Disp: , Rfl:    Social History: Social History   Tobacco Use  . Smoking status: Never Smoker  . Smokeless tobacco: Never Used  Vaping Use  . Vaping Use: Never used  Substance Use Topics  . Alcohol use: No  . Drug use: No    Family Medical History:      Family History  Problem Relation Age of Onset  . Hypertension Mother   . Breast cancer Mother   . CAD Father   . Heart attack Father   . Bladder Cancer Brother  Physical Examination:     Vitals:   12/26/19 0609 12/26/19 0630  BP: 139/82   Pulse: (!) 57 (!) 51  Resp: 18   Temp: 98 F (36.7 C)   SpO2:  95%     General:          Patient is well developed, well nourished, calm, collected, and in no apparent distress.  Psychiatric:      Patient is non-anxious.  Head:               Pupils equal, round, and reactive to light.   NEUROLOGICAL:  General: In no acute distress.   Awake, alert, oriented to person, place, and time.  Pupils equal round and reactive to light.  Facial tone is symmetric.  Tongue protrusion is midline.  There is no pronator drift.  ROM of spine: deferred given previous surgery  Incisions to posterior spine and R lateral incision are clean, dry, intact, no evidence of infection noted.   Strength: Side Biceps Triceps Deltoid Interossei Grip Wrist Ext. Wrist Flex.  R '5 5 5 5 5 5 5  ' L '5 5 5 5 5 5 5   ' Side Iliopsoas Quads Hamstring PF DF EHL  R '3 5 5 5 5 5  ' L '5 5 5 5 5 5   ' Bilateral upper and lower extremity sensation is intact to light touch and pin prick.   Imaging:  Testicular US 12/25/2019: IMPRESSION: 1. Negative  for testicular torsion or intratesticular mass. 2. Bilateral varicocele. 3. Small left and trace right hydroceles.  CT abd/pelvis 12/25/2019: IMPRESSION: 1. Ill-defined, possibly rim enhancing collection within the right psoas muscle measuring approximately 2.9 x 2.4 x 5.9 cm. This is in the immediate vicinity of the patient's L4-5 posterior decompression fusion with interbody spacer placement. Appearance could raise concern for an intramuscular abscess versus hematoma/seroma. Hardware infection cannot be fully excluded particularly in the setting of leukocytosis. Recommend correlation with patient's symptoms and further investigation with contrast-enhanced lumbar MRI. 2. Bilateral nonobstructing nephrolithiasis. Some mild periureteral stranding is favored to be reactive given the adjacent psoas process though recently passed calculus or urinary tract infection could present similarly. Recommend correlation with urinalysis. 3. Few prominent retroperitoneal lymph nodes adjacent the psoas musculature on the right, likely reactive. 4. Hepatic steatosis. 5. Post cholecystectomy. 6. Colonic diverticulosis without evidence of diverticulitis.  MRI L spine 12/25/2019:  IMPRESSION: 1. Medial right psoas muscle abscess at the L4-5 level. 2. Fluid-filled and abnormally enhancing facet joints at L4-5, which may indicate septic arthritis. 3. 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   Assessment and Plan: Manuel Cain is a pleasant 49 y.o. male who was seen yesterday in clinic for a two week postoperative check from an L4-5 XLIF w/ PSF.  He was recommended to come to the ED after he endorsed severe R testicular pain and swelling, in order to r/o testicular torsion, which has been r/o by [place].   We have reviewed the results of his CT and MRI. We believe that the results point to a post operative reactive state rather than infection, which is  further supported by a normal ESR and CRP. Although leukocytosis is present, it is to a lesser degree than preoperative. Additionally, he has no fever.   We will continue with our prior plan which is to have him attend PT to work on his R psoas muscle strength.  We will have him return for repeat labs next week. He knows to monitor  for any signs of infection.  Regarding his testicular pain, he should follow up with his PCP regarding the results of his [place].  I have discussed the condition with the patient, including showing the radiographs and discussing treatment options in layman's terms.    He is cleared to discharge home at this time.   Lonell Face, NP Dept. of Neurosurgery

## 2019-12-26 NOTE — Discharge Instructions (Signed)
Return to clinic in one week for lab draw. We will call you to set this up. Please f/u with your PCP regarding results of your ultrasound. Contact us if you have any symptoms of infection (fever, chills).

## 2019-12-26 NOTE — ED Notes (Signed)
Pt resting comfortably at this time. Pt denies any needs at this time. NAD noted.

## 2019-12-26 NOTE — ED Notes (Signed)
D/C and f/up discussed with pt. No distress noted. VSS. Wife at bedside.

## 2019-12-26 NOTE — Consult Note (Signed)
Referring Physician:  No referring provider defined for this encounter.  Primary Physician:  Dion Body, MD  Chief Complaint:  R testicular pain  History of Present Illness: Manuel Cain is a 49 y.o. male who presents with the chief complaint of R testicular pain. He is s/p L4-5 XLIF on 12/11/2019. He presented to clinic yesterday afternoon for his routine postoperative check.  He did have R psoas weakness and R groin/thigh pain as well, this was discussed in detail with Dr. Izora Ribas and our plan was to send him to PT for his R psoas weakness and prescribe a medrol dosepak for his c/o of pain. We had no concern for any surgical infection.   He also endorsed severe R testicular pain and scrotal swelling. I reached out to his PCP Dr. Netty Starring who recommended pt present to [facility] ED to r/o testicular torsion.  While in ED, he had a testicular US which was negative for testicular torsion or intratesticular mass.  He also had an MRI of lumbar spine and CT abd pelvis which noted some post surgical inflammation.   Review of Systems:  A 10 point review of systems is negative, except for the pertinent positives and negatives detailed in the HPI.  Past Medical History: Past Medical History:  Diagnosis Date  . Anxiety   . Blood in stool    dark pebble like per pt  x4 wks  . Chest pain    on going per pt-x 56mo . Chronic ankle pain    Right, s/p [id number]  . Chronic hip pain    right, s/p [id number]  . Curvature of spine   . Dyspnea   . Esophagitis   . Frequent nosebleeds    x 2 mo per pt (march 2018)  . GERD (gastroesophageal reflux disease)   . Head injury 04/2016      . Hemorrhoids   . Hypertension   . Memory changes    x 1 yr-since 2017  . Migraine    since 2013  . Vasovagal syncope     Past Surgical History: Past Surgical History:  Procedure Laterality Date  . CHOLECYSTECTOMY N/A 08/23/2018   Procedure: LAPAROSCOPIC CHOLECYSTECTOMY;  Surgeon:  CHerbert Pun MD;  Location: [facility];  Service: General;  Laterality: N/A;  . COLONOSCOPY WITH PROPOFOL N/A 12/16/2016   Procedure: COLONOSCOPY WITH PROPOFOL;  Surgeon: WLucilla Lame MD;  [place]  Service: Endoscopy;  Laterality: N/A;  . ESOPHAGOGASTRODUODENOSCOPY (EGD) WITH PROPOFOL N/A 12/16/2016   Procedure: ESOPHAGOGASTRODUODENOSCOPY (EGD) WITH PROPOFOL;  Surgeon: WLucilla Lame MD;  [place]  Service: Endoscopy;  Laterality: N/A;  . ESOPHAGOGASTRODUODENOSCOPY (EGD) WITH PROPOFOL N/A 08/09/2018   Procedure: ESOPHAGOGASTRODUODENOSCOPY (EGD) WITH PROPOFOL;  Surgeon: Toledo, TBenay Pike MD;  Location: [facility] ENDOS[facility];  Service: Gastroenterology;  Laterality: N/A;  . KNEE SURGERY Left   . POLYPECTOMY  12/16/2016   Procedure: POLYPECTOMY;  Surgeon: WLucilla Lame MD;  [place]  Service: Endoscopy;;  . vastectomy  1999    Allergies: Allergies as of 12/25/2019 - Review Complete 12/25/2019  Allergen Reaction Noted  . Valium [diazepam] Other (See Comments) 06/25/2016    Medications:  Current Facility-Administered Medications:  .  0.9 %  sodium chloride infusion, , Intravenous, Continuous, CDeetta Perla MD, Last Rate: 75 mL/hr at 12/26/19 0101, New Bag at 12/26/19 0101 .  albuterol (VENTOLIN HFA) 108 (90 Base) MCG/ACT inhaler 2 puff, 2 puff, Inhalation, Q4H PRN, CDeetta Perla MD .  budesonide (PULMICORT) nebulizer solution 0.5  mg, 0.5 mg, Nebulization, BID, Deetta Perla, MD .  calcium-vitamin D (OSCAL WITH D) 500-200 MG-UNIT per tablet 1 tablet, 1 tablet, Oral, BID, Deetta Perla, MD .  docusate sodium (COLACE) capsule 100 mg, 100 mg, Oral, BID, Deetta Perla, MD .  fluticasone (FLOVENT HFA) 220 MCG/ACT inhaler 2 puff, 2 puff, Inhalation, BID PRN, Deetta Perla, MD .  gabapentin (NEURONTIN) capsule 300 mg, 300 mg, Oral, TID, Deetta Perla, MD .  HYDROcodone-acetaminophen (NORCO/VICODIN) 5-325 MG per tablet 1-2 tablet, 1-2  tablet, Oral, Q6H PRN, Deetta Perla, MD .  methocarbamol (ROBAXIN) tablet 500 mg, 500 mg, Oral, Q6H PRN, Deetta Perla, MD .  morphine 2 MG/ML injection 2 mg, 2 mg, Intravenous, Q3H PRN, Deetta Perla, MD, 2 mg at 12/26/19 1791 .  multivitamin with minerals tablet 1 tablet, 1 tablet, Oral, Daily, Deetta Perla, MD .  ondansetron (ZOFRAN-ODT) disintegrating tablet 4 mg, 4 mg, Oral, Q8H PRN, Deetta Perla, MD .  pantoprazole (PROTONIX) EC tablet 40 mg, 40 mg, Oral, BID AC, Deetta Perla, MD .  PARoxetine (PAXIL) tablet 15 mg, 15 mg, Oral, QHS, Deetta Perla, MD, 15 mg at 12/26/19 0100 .  topiramate (TOPAMAX) tablet 25 mg, 25 mg, Oral, BID, Deetta Perla, MD, 25 mg at 12/26/19 0100 .  vitamin B-12 (CYANOCOBALAMIN) tablet 1,500 mcg, 1,500 mcg, Oral, Daily, Deetta Perla, MD  Current Outpatient Medications:  .  albuterol (PROVENTIL HFA;VENTOLIN HFA) 108 (90 Base) MCG/ACT inhaler, Inhale 2 puffs into the lungs every 4 (four) hours as needed for shortness of breath (or chest tightness)., Disp: 1 Inhaler, Rfl: 0 .  budesonide (PULMICORT) 0.5 MG/2ML nebulizer solution, Take 0.5 mg by nebulization in the morning and at bedtime. , Disp: , Rfl:  .  Calcium-Vitamin D-Vitamin K (VIACTIV CALCIUM PLUS D) 650-12.5-40 MG-MCG-MCG CHEW, Chew 1 each by mouth in the morning and at bedtime., Disp: , Rfl:  .  fluticasone (FLOVENT HFA) 220 MCG/ACT inhaler, Inhale 2 puffs into the lungs 2 (two) times daily as needed (asthma). , Disp: , Rfl:  .  HYDROcodone-acetaminophen (NORCO) 5-325 MG tablet, Take 1 tablet by mouth 3 (three) times daily as needed., Disp: 10 tablet, Rfl: 0 .  Multiple Vitamins-Minerals (CENTRUM SILVER 50+MEN PO), Take 1 tablet by mouth daily. , Disp: , Rfl:  .  ondansetron (ZOFRAN ODT) 4 MG disintegrating tablet, Take 1 tablet (4 mg total) by mouth every 8 (eight) hours as needed., Disp: 15 tablet, Rfl: 0 .  pantoprazole (PROTONIX) 20 MG tablet, Take 40 mg by mouth 2 (two) times daily before a meal. , Disp: , Rfl:   .  PARoxetine (PAXIL) 10 MG tablet, Take 15 mg by mouth at bedtime. , Disp: , Rfl:  .  topiramate (TOPAMAX) 50 MG tablet, Take 25 mg by mouth 2 (two) times daily., Disp: , Rfl:  .  vitamin B-12 (CYANOCOBALAMIN) 500 MCG tablet, Take 1,500 mcg by mouth daily. , Disp: , Rfl:    Social History: Social History   Tobacco Use  . Smoking status: Never Smoker  . Smokeless tobacco: Never Used  Vaping Use  . Vaping Use: Never used  Substance Use Topics  . Alcohol use: No  . Drug use: No    Family Medical History: Family History  Problem Relation Age of Onset  . Hypertension Mother   . Breast cancer Mother   . CAD Father   . Heart attack Father   . Bladder Cancer Brother     Physical Examination: Vitals:   12/26/19 0609 12/26/19 0630  BP: 139/82   Pulse: (!) 57 (!) 51  Resp: 18   Temp: 98 F (36.7 C)   SpO2:  95%     General: Patient is well developed, well nourished, calm, collected, and in no apparent distress.  Psychiatric: Patient is non-anxious.  Head:  Pupils equal, round, and reactive to light.   NEUROLOGICAL:  General: In no acute distress.   Awake, alert, oriented to person, place, and time.  Pupils equal round and reactive to light.  Facial tone is symmetric.  Tongue protrusion is midline.  There is no pronator drift.  ROM of spine: deferred given previous surgery  Incisions to posterior spine and R lateral incision are clean, dry, intact, no evidence of infection noted.   Strength: Side Biceps Triceps Deltoid Interossei Grip Wrist Ext. Wrist Flex.  R '5 5 5 5 5 5 5  ' L '5 5 5 5 5 5 5   ' Side Iliopsoas Quads Hamstring PF DF EHL  R '3 5 5 5 5 5  ' L '5 5 5 5 5 5   ' Bilateral upper and lower extremity sensation is intact to light touch and pin prick.   Imaging:  Testicular US 12/25/2019: IMPRESSION: 1. Negative for testicular torsion or intratesticular mass. 2. Bilateral varicocele. 3. Small left and trace right hydroceles.  CT abd/pelvis  12/25/2019: IMPRESSION: 1. Ill-defined, possibly rim enhancing collection within the right psoas muscle measuring approximately 2.9 x 2.4 x 5.9 cm. This is in the immediate vicinity of the patient's L4-5 posterior decompression fusion with interbody spacer placement. Appearance could raise concern for an intramuscular abscess versus hematoma/seroma. Hardware infection cannot be fully excluded particularly in the setting of leukocytosis. Recommend correlation with patient's symptoms and further investigation with contrast-enhanced lumbar MRI. 2. Bilateral nonobstructing nephrolithiasis. Some mild periureteral stranding is favored to be reactive given the adjacent psoas process though recently passed calculus or urinary tract infection could present similarly. Recommend correlation with urinalysis. 3. Few prominent retroperitoneal lymph nodes adjacent the psoas musculature on the right, likely reactive. 4. Hepatic steatosis. 5. Post cholecystectomy. 6. Colonic diverticulosis without evidence of diverticulitis.  MRI L spine 12/25/2019:  IMPRESSION: 1. Medial right psoas muscle abscess at the L4-5 level. 2. Fluid-filled and abnormally enhancing facet joints at L4-5, which may indicate septic arthritis. 3. 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   Assessment and Plan: Mr. Chachere is a pleasant 49 y.o. male who was seen yesterday in clinic for a two week postoperative check from an L4-5 XLIF w/ PSF.  He was recommended to come to the ED after he endorsed severe R testicular pain and swelling, in order to r/o testicular torsion, which has been r/o by [place].   We have reviewed the results of his CT and MRI. We believe that the results point to a post operative reactive state rather than infection, which is further supported by a normal ESR and CRP. Although leukocytosis is present, it is to a lesser degree than preoperative. Additionally, he has  no fever.   We will continue with our prior plan which is to have him attend PT to work on his R psoas muscle strength.  We will have him return for repeat labs next week. He knows to monitor for any signs of infection.  Regarding his testicular pain, he should follow up with his PCP regarding the results of his [place].  I have discussed the condition with the patient, including showing the radiographs and discussing treatment options in  layman's terms.    He is cleared to discharge home at this time.   Lonell Face, NP Dept. of Neurosurgery

## 2019-12-30 LAB — CULTURE, BLOOD (ROUTINE X 2)
Culture: NO GROWTH
Culture: NO GROWTH

## 2020-04-07 ENCOUNTER — Other Ambulatory Visit: Payer: Self-pay | Admitting: Otolaryngology

## 2020-04-09 LAB — SURGICAL PATHOLOGY

## 2020-04-18 ENCOUNTER — Other Ambulatory Visit
Admission: RE | Admit: 2020-04-18 | Discharge: 2020-04-18 | Disposition: A | Discharge status: Home / Self Care | Payer: Managed Care, Other (non HMO) | Source: Ambulatory Visit | Attending: Family Medicine | Admitting: Family Medicine

## 2020-04-18 DIAGNOSIS — R0600 Dyspnea, unspecified: Secondary | ICD-10-CM | POA: Insufficient documentation

## 2020-04-18 DIAGNOSIS — R0789 Other chest pain: Secondary | ICD-10-CM | POA: Insufficient documentation

## 2020-04-18 LAB — D-DIMER, QUANTITATIVE: D-Dimer, Quant: 0.37 ug/mL-FEU (ref 0.00–0.50)

## 2020-06-14 ENCOUNTER — Encounter: Payer: Self-pay | Admitting: Intensive Care

## 2020-06-14 ENCOUNTER — Emergency Department
Admission: EM | Admit: 2020-06-14 | Discharge: 2020-06-14 | Disposition: A | Discharge status: Home / Self Care | Payer: 59 | Attending: Emergency Medicine | Admitting: Emergency Medicine

## 2020-06-14 ENCOUNTER — Emergency Department: Payer: 59

## 2020-06-14 ENCOUNTER — Other Ambulatory Visit: Payer: Self-pay

## 2020-06-14 DIAGNOSIS — N200 Calculus of kidney: Secondary | ICD-10-CM

## 2020-06-14 DIAGNOSIS — R109 Unspecified abdominal pain: Secondary | ICD-10-CM | POA: Diagnosis present

## 2020-06-14 DIAGNOSIS — Z85038 Personal history of other malignant neoplasm of large intestine: Secondary | ICD-10-CM | POA: Diagnosis not present

## 2020-06-14 DIAGNOSIS — K219 Gastro-esophageal reflux disease without esophagitis: Secondary | ICD-10-CM | POA: Insufficient documentation

## 2020-06-14 DIAGNOSIS — Z7951 Long term (current) use of inhaled steroids: Secondary | ICD-10-CM | POA: Insufficient documentation

## 2020-06-14 DIAGNOSIS — I10 Essential (primary) hypertension: Secondary | ICD-10-CM | POA: Insufficient documentation

## 2020-06-14 LAB — URINALYSIS, COMPLETE (UACMP) WITH MICROSCOPIC
Bacteria, UA: NONE SEEN
Bilirubin Urine: NEGATIVE
Glucose, UA: NEGATIVE mg/dL
Ketones, ur: NEGATIVE mg/dL
Leukocytes,Ua: NEGATIVE
Nitrite: NEGATIVE
Protein, ur: NEGATIVE mg/dL
Specific Gravity, Urine: 1.01 (ref 1.005–1.030)
pH: 8 (ref 5.0–8.0)

## 2020-06-14 IMAGING — CT CT RENAL STONE PROTOCOL
2 of 4 series · 16 of 46 positions shown, 18 images · non-contrast
Comparison: CT abdomen pelvis [DATE]

CLINICAL DATA: Right-sided flank pain that started this morning.

EXAM:
CT ABDOMEN AND PELVIS WITHOUT CONTRAST
TECHNIQUE: Multidetector CT imaging of the abdomen and pelvis was performed
following the standard protocol without IV contrast.

[Series 2: stone full standard · axial · 0.85mm/px · z∈[-989,-449]mm · 13 of 118 slices shown, 15 images]
[im 5/118  soft-tissue]
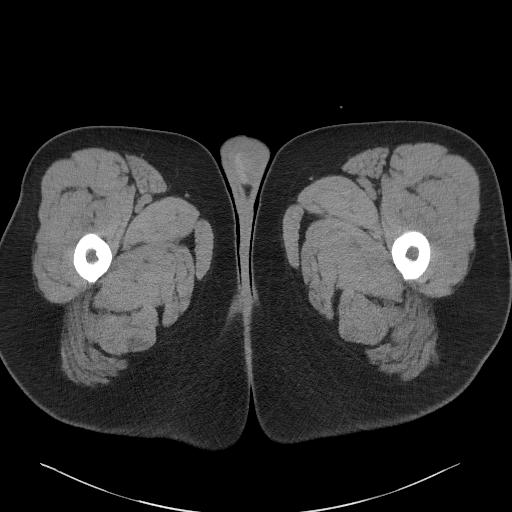
[im 5/118  bone]
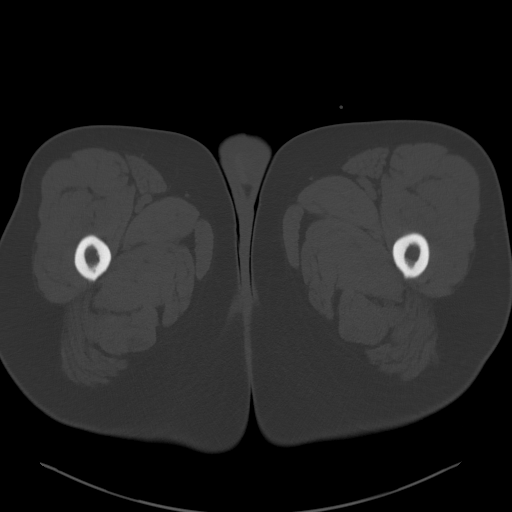
[im 15/118  soft-tissue]
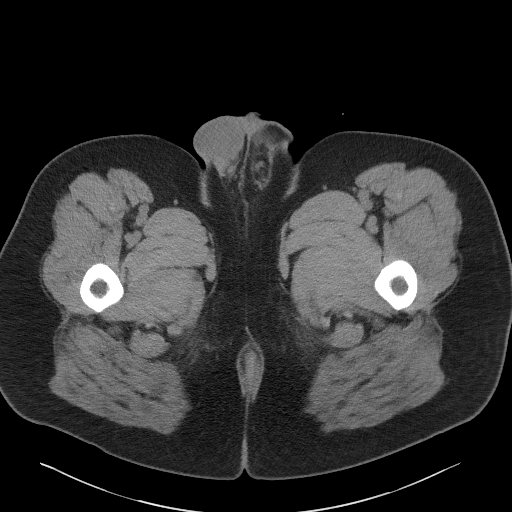
[im 24/118  soft-tissue]
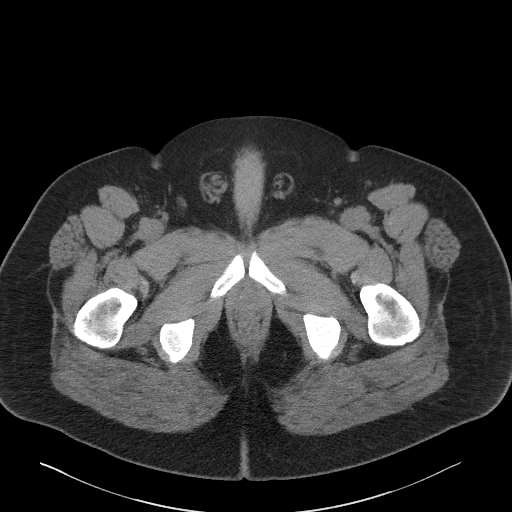
[im 33/118  soft-tissue]
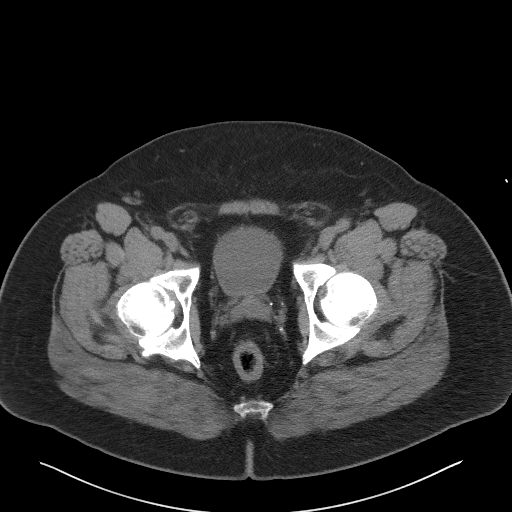
[im 43/118  soft-tissue]
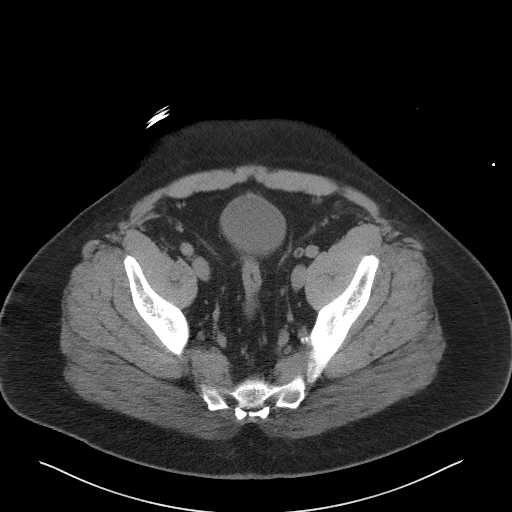
[im 52/118  soft-tissue]
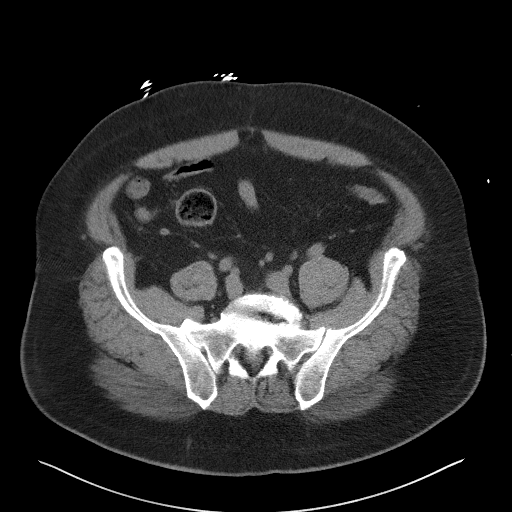
[im 61/118  soft-tissue]
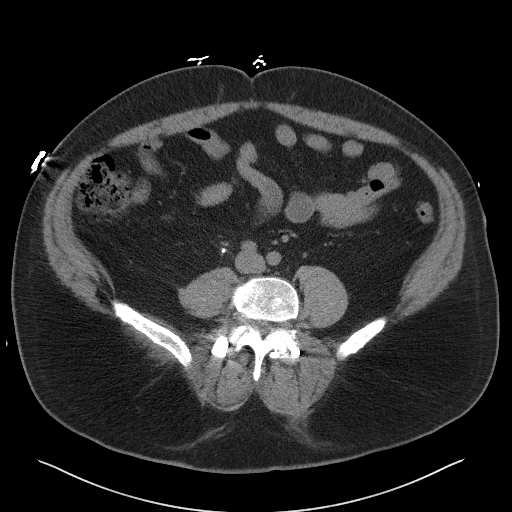
[im 66/118  soft-tissue]
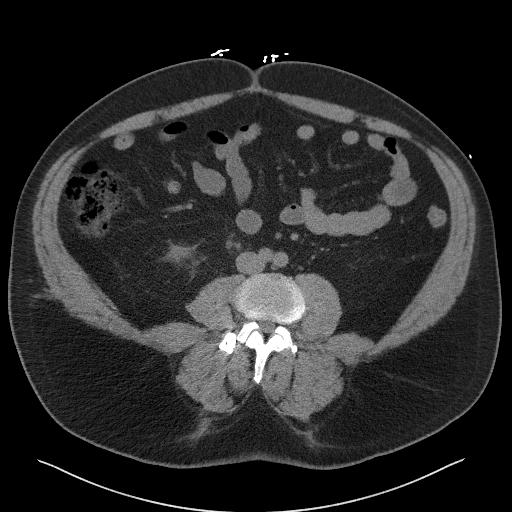
[im 75/118  soft-tissue]
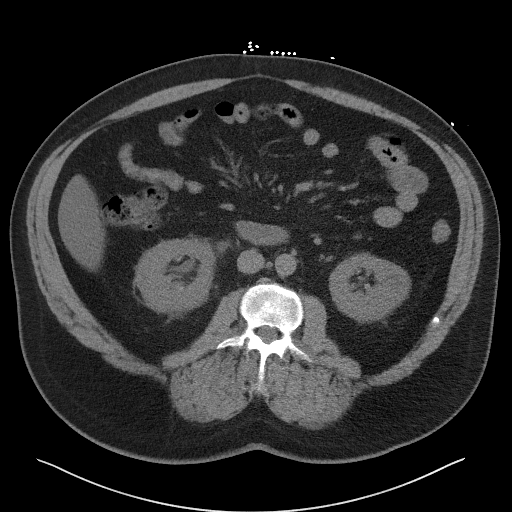
[im 75/118  bone]
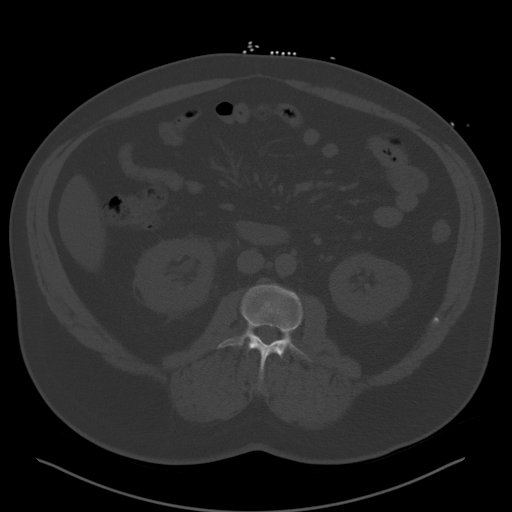
[im 85/118  soft-tissue]
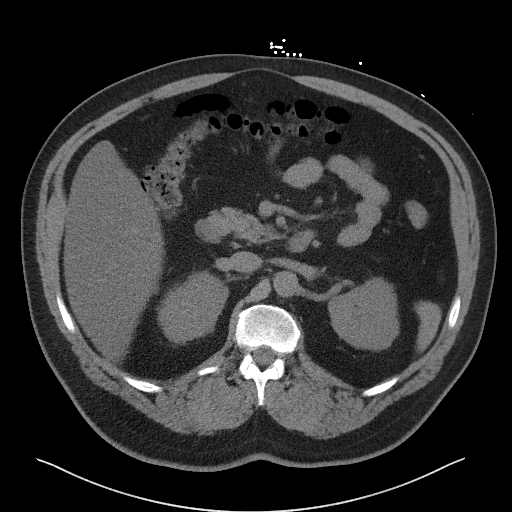
[im 94/118  soft-tissue]
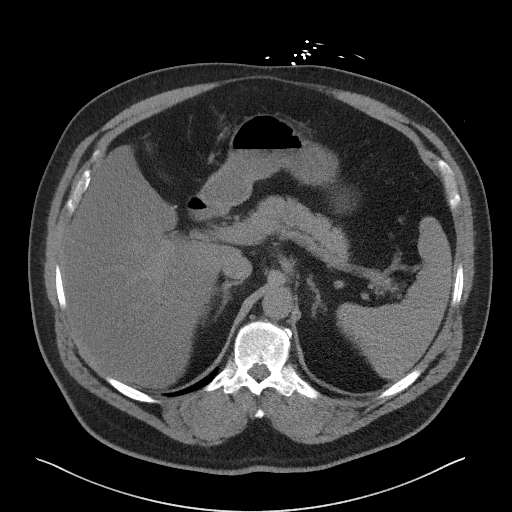
[im 103/118  soft-tissue]
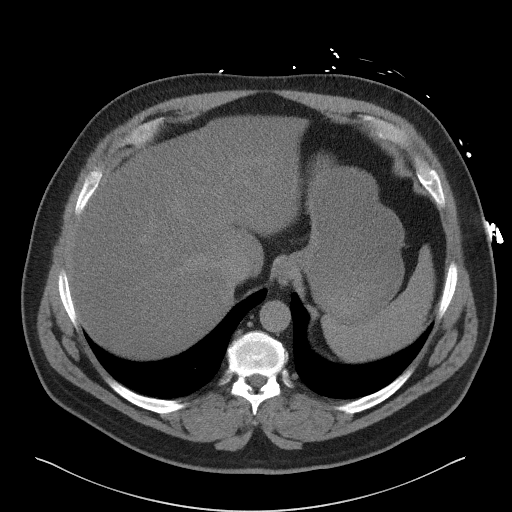
[im 113/118  soft-tissue]
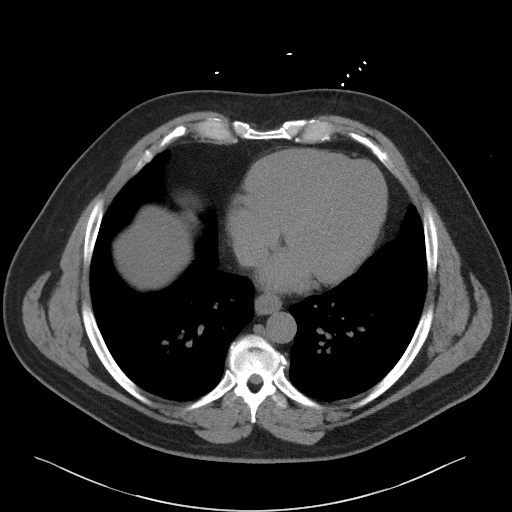

[Series 5: coronal · coronal · 0.90mm/px · 3 of 185 slices shown]
[im 62/185  soft-tissue]
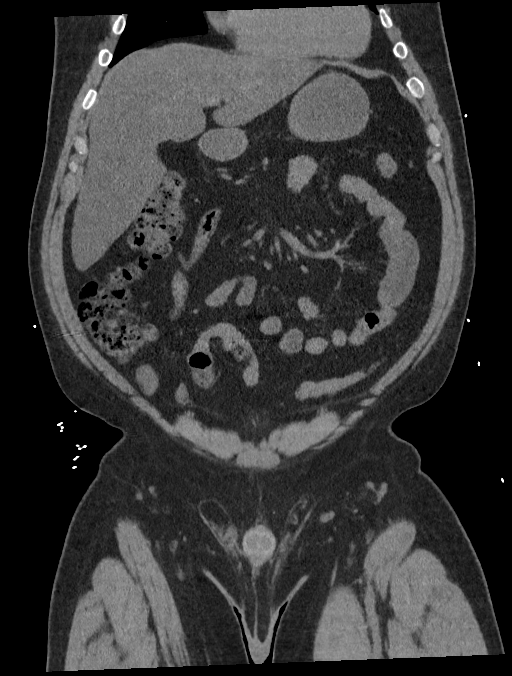
[im 82/185  soft-tissue]
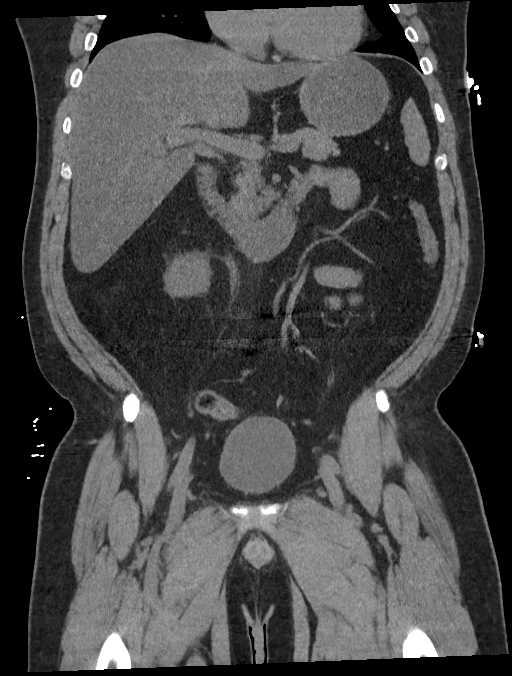
[im 103/185  soft-tissue]
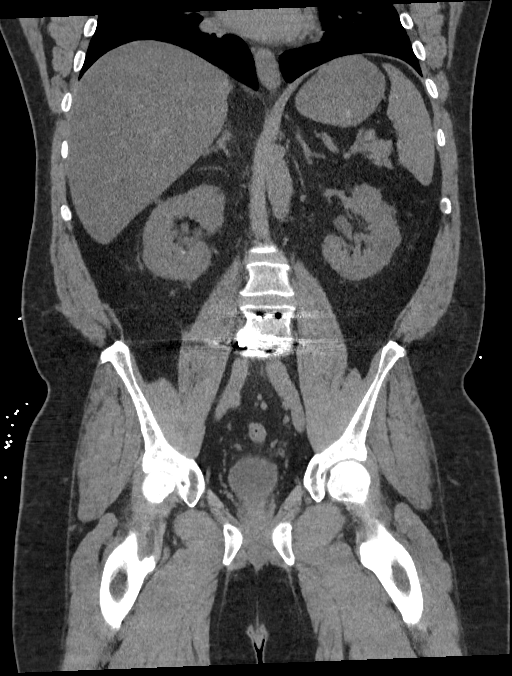

[16 of 46 positions shown; findings below may reference images not displayed]

FINDINGS: Lower chest: No acute abnormality.

Evaluation of the abdominal viscera somewhat limited by the lack of
IV contrast.

Hepatobiliary: Hepatic steatosis. No focal liver lesion identified.
Gallbladder is surgically absent.

Pancreas: Unremarkable.  No surrounding inflammatory stranding.

Spleen: Normal in size without focal abnormality.

Adrenals/Urinary Tract: Adrenal glands are unremarkable. There is a
4 mm calculus in the right kidney. There is mild right
hydronephrosis secondary to an obstructing calculus in the mid right
ureter measuring 3 mm (series 2, image 58) no left renal calculi or
hydronephrosis. No large renal mass. Urinary bladder is
unremarkable.

Stomach/Bowel: Stomach is within normal limits. Appendix appears
normal. No evidence of bowel wall thickening, distention, or
inflammatory changes.

Vascular/Lymphatic: No significant vascular findings are present. No
enlarged abdominal or pelvic lymph nodes.

Reproductive: Prostate is unremarkable.

Other: No abdominal wall hernia or abnormality. No abdominopelvic
ascites.

Musculoskeletal: Lower lumbar orthopedic hardware. No acute finding.
IMPRESSION: 1. There is a 3 mm obstructing calculus in the mid right ureter
resulting in mild right hydronephrosis.
2. Additional 4 mm calculus in the right kidney.
3. Hepatic steatosis.

## 2020-06-14 MED ORDER — SODIUM CHLORIDE 0.9 % IV BOLUS
1000.0000 mL | Freq: Once | INTRAVENOUS | Status: AC
  Administered 2020-06-14: 1000 mL via INTRAVENOUS

## 2020-06-14 MED ORDER — TAMSULOSIN HCL 0.4 MG PO CAPS: 0.4000 mg | ORAL_CAPSULE | Freq: Every day | ORAL | 0 refills | Status: DC

## 2020-06-14 MED ORDER — KETOROLAC TROMETHAMINE 30 MG/ML IJ SOLN
30.0000 mg | Freq: Once | INTRAMUSCULAR | Status: AC
  Administered 2020-06-14: 30 mg via INTRAVENOUS
  Filled 2020-06-14: qty 1

## 2020-06-14 MED ORDER — HYDROCODONE-ACETAMINOPHEN 5-325 MG PO TABS
2.0000 | ORAL_TABLET | Freq: Once | ORAL | Status: AC
  Administered 2020-06-14: 2 via ORAL
  Filled 2020-06-14: qty 2

## 2020-06-14 MED ORDER — TAMSULOSIN HCL 0.4 MG PO CAPS
0.8000 mg | ORAL_CAPSULE | Freq: Once | ORAL | Status: AC
  Administered 2020-06-14: 0.8 mg via ORAL
  Filled 2020-06-14: qty 2

## 2020-06-14 MED ORDER — KETOROLAC TROMETHAMINE 10 MG PO TABS
10.0000 mg | ORAL_TABLET | Freq: Four times a day (QID) | ORAL | 0 refills | Status: DC | PRN
Start: 2020-06-14 — End: 2020-06-18

## 2020-06-14 MED ORDER — HYDROCODONE-ACETAMINOPHEN 5-325 MG PO TABS: 1.0000 | ORAL_TABLET | ORAL | 0 refills | Status: DC | PRN

## 2020-06-14 NOTE — ED Triage Notes (Signed)
Presents with right sided flank pain that started this AM around 0830.

## 2020-06-14 NOTE — ED Provider Notes (Signed)
[facility] Emergency Department Provider Note   ____________________________________________   Event Date/Time   First MD Initiated Contact with Patient 06/14/20 1054     (approximate)  I have reviewed the triage vital signs and the nursing notes.   HISTORY  Chief Complaint Flank Pain    HPI Manuel Cain is a 50 y.o. male with a stated past medical history of anxiety/depression, obesity, hypertension, and hemorrhoids who presents for right flank pain that began at approximately 0830 today and is described as 10/10, lower right-sided flank pain that radiates down into his groin and has no exacerbating or relieving factors.  Patient does endorse associated nausea.  Patient denies any pain similar to this in the past.  Patient does endorse family history of kidney stones in his mother.  Patient currently denies any vision changes, tinnitus, difficulty speaking, facial droop, sore throat, chest pain, shortness of breath, nausea/vomiting/diarrhea, dysuria, or weakness/numbness/paresthesias in any extremity         Past Medical History:  Diagnosis Date  . Anxiety   . Blood in stool    dark pebble like per pt  x4 wks  . Chest pain    on going per pt-x 32mo  . Chronic ankle pain    Right, s/p ASN(0539)  . Chronic hip pain    right, s/p [id number]([id number])  . Curvature of spine   . Dyspnea   . Esophagitis   . Frequent nosebleeds    x 2 mo per pt (march 2018)  . GERD (gastroesophageal reflux disease)   . Head injury 04/2016      . Hemorrhoids   . Hypertension   . Memory changes    x 1 yr-since 2017  . Migraine    since 2013  . Vasovagal syncope     Patient Active Problem List   Diagnosis Date Noted  . History of lumbar fusion 12/25/2019  . Chronic nonintractable headache 07/15/2018  . Acute upper respiratory infection 05/25/2018  . Cough 05/25/2018  . B12 deficiency 11/04/2017  . Idiopathic peripheral neuropathy 11/03/2017  . Sexual  dysfunction 04/18/2017  . Situational anxiety 04/18/2017  . Hematochezia   . Benign neoplasm of transverse colon   . Rectal polyp   . Gastroesophageal reflux disease   . Vaccine counseling 06/10/2016  . Essential hypertension 10/25/2015  . Morbid obesity with BMI of 40.0-44.9, adult ([place]) 10/25/2015  . Leukocytosis 10/25/2015  . Chest pain, unspecified 10/24/2015    Past Surgical History:  Procedure Laterality Date  . CHOLECYSTECTOMY N/A 08/23/2018   Procedure: LAPAROSCOPIC CHOLECYSTECTOMY;  Surgeon: Herbert Pun, MD;  Location: [facility];  Service: General;  Laterality: N/A;  . COLONOSCOPY WITH PROPOFOL N/A 12/16/2016   Procedure: COLONOSCOPY WITH PROPOFOL;  Surgeon: Lucilla Lame, MD;  Location: [place];  Service: Endoscopy;  Laterality: N/A;  . ESOPHAGOGASTRODUODENOSCOPY (EGD) WITH PROPOFOL N/A 12/16/2016   Procedure: ESOPHAGOGASTRODUODENOSCOPY (EGD) WITH PROPOFOL;  Surgeon: Lucilla Lame, MD;  Location: [place];  Service: Endoscopy;  Laterality: N/A;  . ESOPHAGOGASTRODUODENOSCOPY (EGD) WITH PROPOFOL N/A 08/09/2018   Procedure: ESOPHAGOGASTRODUODENOSCOPY (EGD) WITH PROPOFOL;  Surgeon: Toledo, Benay Pike, MD;  Location: [facility];  Service: Gastroenterology;  Laterality: N/A;  . KNEE SURGERY Left   . POLYPECTOMY  12/16/2016   Procedure: POLYPECTOMY;  Surgeon: Lucilla Lame, MD;  Location: [place];  Service: Endoscopy;;  . vastectomy  1999    Prior to Admission medications   Medication Sig Start Date End Date Taking? Authorizing Provider  albuterol (PROVENTIL  HFA;VENTOLIN HFA) 108 (90 Base) MCG/ACT inhaler Inhale 2 puffs into the lungs every 4 (four) hours as needed for shortness of breath (or chest tightness). 06/02/18   Arta Silence, MD  budesonide (PULMICORT) 0.5 MG/2ML nebulizer solution Take 0.5 mg by nebulization in the morning and at bedtime.  08/21/19   [provider]  Calcium-Vitamin D-Vitamin K (VIACTIV CALCIUM PLUS  D) 650-12.5-40 MG-MCG-MCG CHEW Chew 1 each by mouth in the morning and at bedtime.    [provider]  fluticasone (FLOVENT HFA) 220 MCG/ACT inhaler Inhale 2 puffs into the lungs 2 (two) times daily as needed (asthma).     [provider]  HYDROcodone-acetaminophen (NORCO) 5-325 MG tablet Take 1 tablet by mouth 3 (three) times daily as needed. 09/07/19   Menshew, Dannielle Karvonen, PA-C  Multiple Vitamins-Minerals (CENTRUM SILVER 50+MEN PO) Take 1 tablet by mouth daily.     [provider]  ondansetron (ZOFRAN ODT) 4 MG disintegrating tablet Take 1 tablet (4 mg total) by mouth every 8 (eight) hours as needed. 09/07/19   Menshew, Dannielle Karvonen, PA-C  pantoprazole (PROTONIX) 20 MG tablet Take 40 mg by mouth 2 (two) times daily before a meal.  07/25/19   [provider]  PARoxetine (PAXIL) 10 MG tablet Take 15 mg by mouth at bedtime.     [provider]  topiramate (TOPAMAX) 50 MG tablet Take 25 mg by mouth 2 (two) times daily.    [provider]  vitamin B-12 (CYANOCOBALAMIN) 500 MCG tablet Take 1,500 mcg by mouth daily.     [provider]    Allergies Valium [diazepam]  Family History  Problem Relation Age of Onset  . Hypertension Mother   . Breast cancer Mother   . CAD Father   . Heart attack Father   . Bladder Cancer Brother     Social History Social History   Tobacco Use  . Smoking status: Never Smoker  . Smokeless tobacco: Never Used  Vaping Use  . Vaping Use: Never used  Substance Use Topics  . Alcohol use: No  . Drug use: No    Review of Systems Constitutional: No fever/chills Eyes: No visual changes. ENT: No sore throat. Cardiovascular: Denies chest pain. Respiratory: Denies shortness of breath. Gastrointestinal: Endorses right flank pain.  No nausea, no vomiting.  No diarrhea. Genitourinary: Negative for dysuria. Musculoskeletal: Negative for acute arthralgias Skin: Negative for rash. Neurological: Negative  for headaches, weakness/numbness/paresthesias in any extremity Psychiatric: Negative for suicidal ideation/homicidal ideation   ____________________________________________   PHYSICAL EXAM:  VITAL SIGNS: ED Triage Vitals  Enc Vitals Group     BP 06/14/20 1056 (!) 146/79     Pulse Rate 06/14/20 1056 63     Resp 06/14/20 1056 20     Temp 06/14/20 1058 97.8 F (36.6 C)     Temp Source 06/14/20 1058 Oral     SpO2 06/14/20 1056 98 %     Weight 06/14/20 1057 270 lb (122.5 kg)     Height 06/14/20 1057 5' 9.5" (1.765 m)     Head Circumference --      Peak Flow --      Pain Score 06/14/20 1057 10     Pain Loc --      Pain Edu? --      Excl. in [place]? --    Constitutional: Alert and oriented. Well appearing and in moderate distress secondary to pain. Eyes: Conjunctivae are normal. PERRL. Head: Atraumatic. Nose: No congestion/rhinnorhea. Mouth/Throat:  Mucous membranes are moist. Neck: No stridor Cardiovascular: Grossly normal heart sounds.  Good peripheral circulation. Respiratory: Normal respiratory effort.  No retractions. Gastrointestinal: Soft and nontender.  Right CVA tenderness to percussion.  No distention. Musculoskeletal: No obvious deformities Neurologic:  Normal speech and language. No gross focal neurologic deficits are appreciated. Skin:  Skin is warm and dry. No rash noted. Psychiatric: Mood and affect are normal. Speech and behavior are normal.  ____________________________________________   LABS (all labs ordered are listed, but only abnormal results are displayed)  Labs Reviewed  URINALYSIS, COMPLETE (UACMP) WITH MICROSCOPIC    RADIOLOGY  ED MD interpretation: CT of the abdomen and pelvis without contrast in stone protocol shows a 3 mm obstructing calculus in the right mid ureter with resulting mild right hydronephrosis.  There is an incidentally found additional 4 mm calculus in the right kidney  Official radiology report(s): CT Renal Stone Study  Result  Date: 06/14/2020 CLINICAL DATA:  Right-sided flank pain that started this morning. EXAM: CT ABDOMEN AND PELVIS WITHOUT CONTRAST TECHNIQUE: Multidetector CT imaging of the abdomen and pelvis was performed following the standard protocol without IV contrast. COMPARISON:  CT abdomen pelvis 12/25/2019 FINDINGS: Lower chest: No acute abnormality. Evaluation of the abdominal viscera somewhat limited by the lack of IV contrast. Hepatobiliary: Hepatic steatosis. No focal liver lesion identified. Gallbladder is surgically absent. Pancreas: Unremarkable.  No surrounding inflammatory stranding. Spleen: Normal in size without focal abnormality. Adrenals/Urinary Tract: Adrenal glands are unremarkable. There is a 4 mm calculus in the right kidney. There is mild right hydronephrosis secondary to an obstructing calculus in the mid right ureter measuring 3 mm (series 2, image 58) no left renal calculi or hydronephrosis. No large renal mass. Urinary bladder is unremarkable. Stomach/Bowel: Stomach is within normal limits. Appendix appears normal. No evidence of bowel wall thickening, distention, or inflammatory changes. Vascular/Lymphatic: No significant vascular findings are present. No enlarged abdominal or pelvic lymph nodes. Reproductive: Prostate is unremarkable. Other: No abdominal wall hernia or abnormality. No abdominopelvic ascites. Musculoskeletal: Lower lumbar orthopedic hardware. No acute finding. IMPRESSION: 1. There is a 3 mm obstructing calculus in the mid right ureter resulting in mild right hydronephrosis. 2. Additional 4 mm calculus in the right kidney. 3. Hepatic steatosis. Electronically Signed   By: Audie Pinto M.D.   On: 06/14/2020 12:26    ____________________________________________   PROCEDURES  Procedure(s) performed (including Critical Care):  .1-3 Lead EKG Interpretation Performed by: Naaman Plummer, MD Authorized by: Naaman Plummer, MD     Interpretation: normal     ECG rate:  73    ECG rate assessment: normal     Rhythm: sinus rhythm     Ectopy: none     Conduction: normal       ____________________________________________   INITIAL IMPRESSION / ASSESSMENT AND PLAN / ED COURSE  As part of my medical decision making, I reviewed the following data within the [place] notes reviewed and incorporated, Labs reviewed, Old chart reviewed, Radiograph reviewed and Notes from prior ED visits reviewed and incorporated        Patient presents for severe flank pain. Presentation most consistent with Renal Colic from a Non-infected Kidney Stone. Given History and Exam I have lower suspicion for atypical appendicitis, genital torsion, acute cholecystitis, AAA, Aortic Dissection, Serious Bacterial Illness or other emergent intraabdominal pathology.  Workup: CT Abd/Pelvis noncontrast, UA, reassess Findings: 3 mm right mid ureteral stone found on CT abdomen pelvis Reassesment: Patient tolerating PO  and pain controlled Disposition:  Discharge. Strict return precautions for infected stone or PO intolerance discussed.      ____________________________________________   FINAL CLINICAL IMPRESSION(S) / ED DIAGNOSES  Final diagnoses:  Right flank pain  Kidney stone     ED Discharge Orders    None       Note:  This document was prepared using Dragon voice recognition software and may include unintentional dictation errors.   Naaman Plummer, MD 06/14/20 1356

## 2020-06-14 NOTE — ED Notes (Signed)
Patient transported to CT 

## 2020-06-18 ENCOUNTER — Other Ambulatory Visit: Payer: Self-pay

## 2020-06-18 ENCOUNTER — Ambulatory Visit
Admission: RE | Admit: 2020-06-18 | Discharge: 2020-06-18 | Disposition: A | Discharge status: Home / Self Care | Payer: 59 | Source: Ambulatory Visit | Attending: Urology | Admitting: Urology

## 2020-06-18 ENCOUNTER — Ambulatory Visit (INDEPENDENT_AMBULATORY_CARE_PROVIDER_SITE_OTHER): Payer: 59 | Admitting: Urology

## 2020-06-18 ENCOUNTER — Ambulatory Visit
Admission: RE | Admit: 2020-06-18 | Discharge: 2020-06-18 | Disposition: A | Discharge status: Home / Self Care | Payer: 59 | Attending: Urology | Admitting: Urology

## 2020-06-18 ENCOUNTER — Encounter: Payer: Self-pay | Admitting: Urology

## 2020-06-18 VITALS — Ht 69.0 in | Wt 277.3 lb

## 2020-06-18 DIAGNOSIS — N200 Calculus of kidney: Secondary | ICD-10-CM

## 2020-06-18 DIAGNOSIS — N201 Calculus of ureter: Secondary | ICD-10-CM | POA: Diagnosis not present

## 2020-06-18 IMAGING — CR DG ABDOMEN 1V
1 series · 2 of 2 positions shown · non-contrast
Comparison: None.

CLINICAL DATA: Right-sided flank pain.

EXAM:
ABDOMEN - 1 VIEW

[Series 1: dg abd 1 view · 0.14mm/px · 2 of 2 slices shown]
[im 1/2]
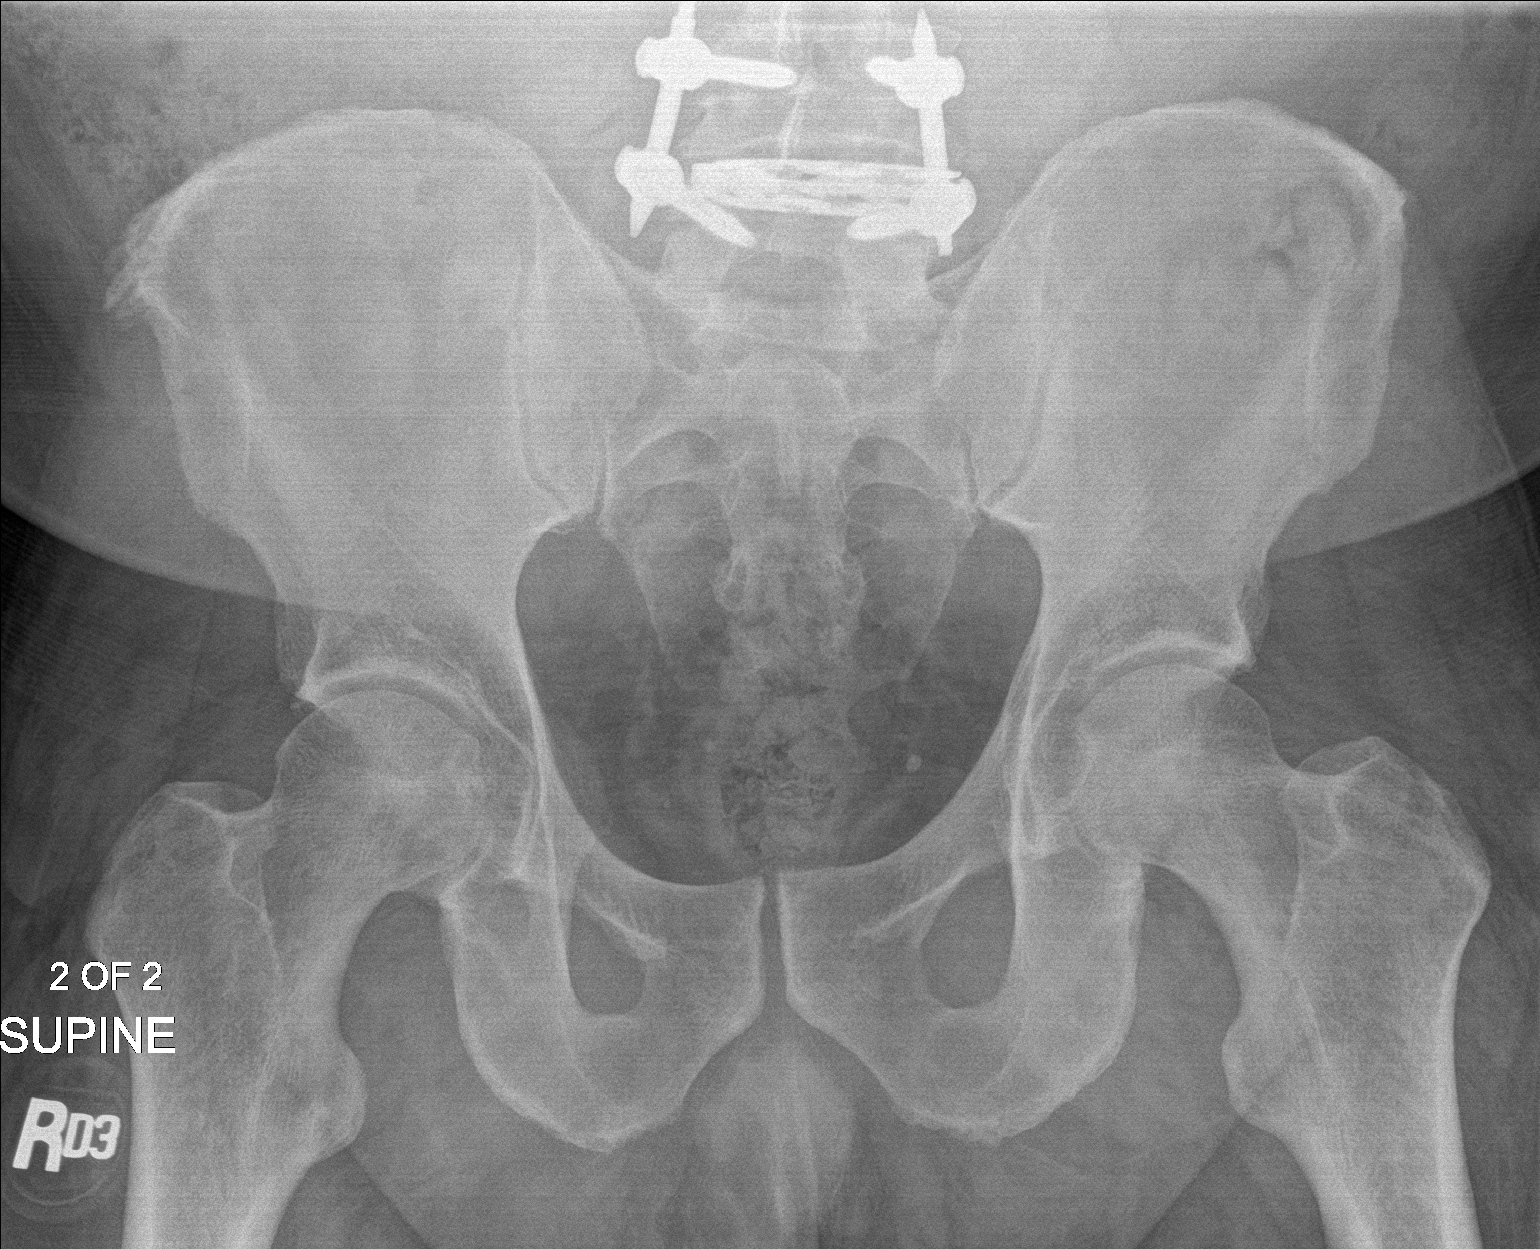
[im 2/2]
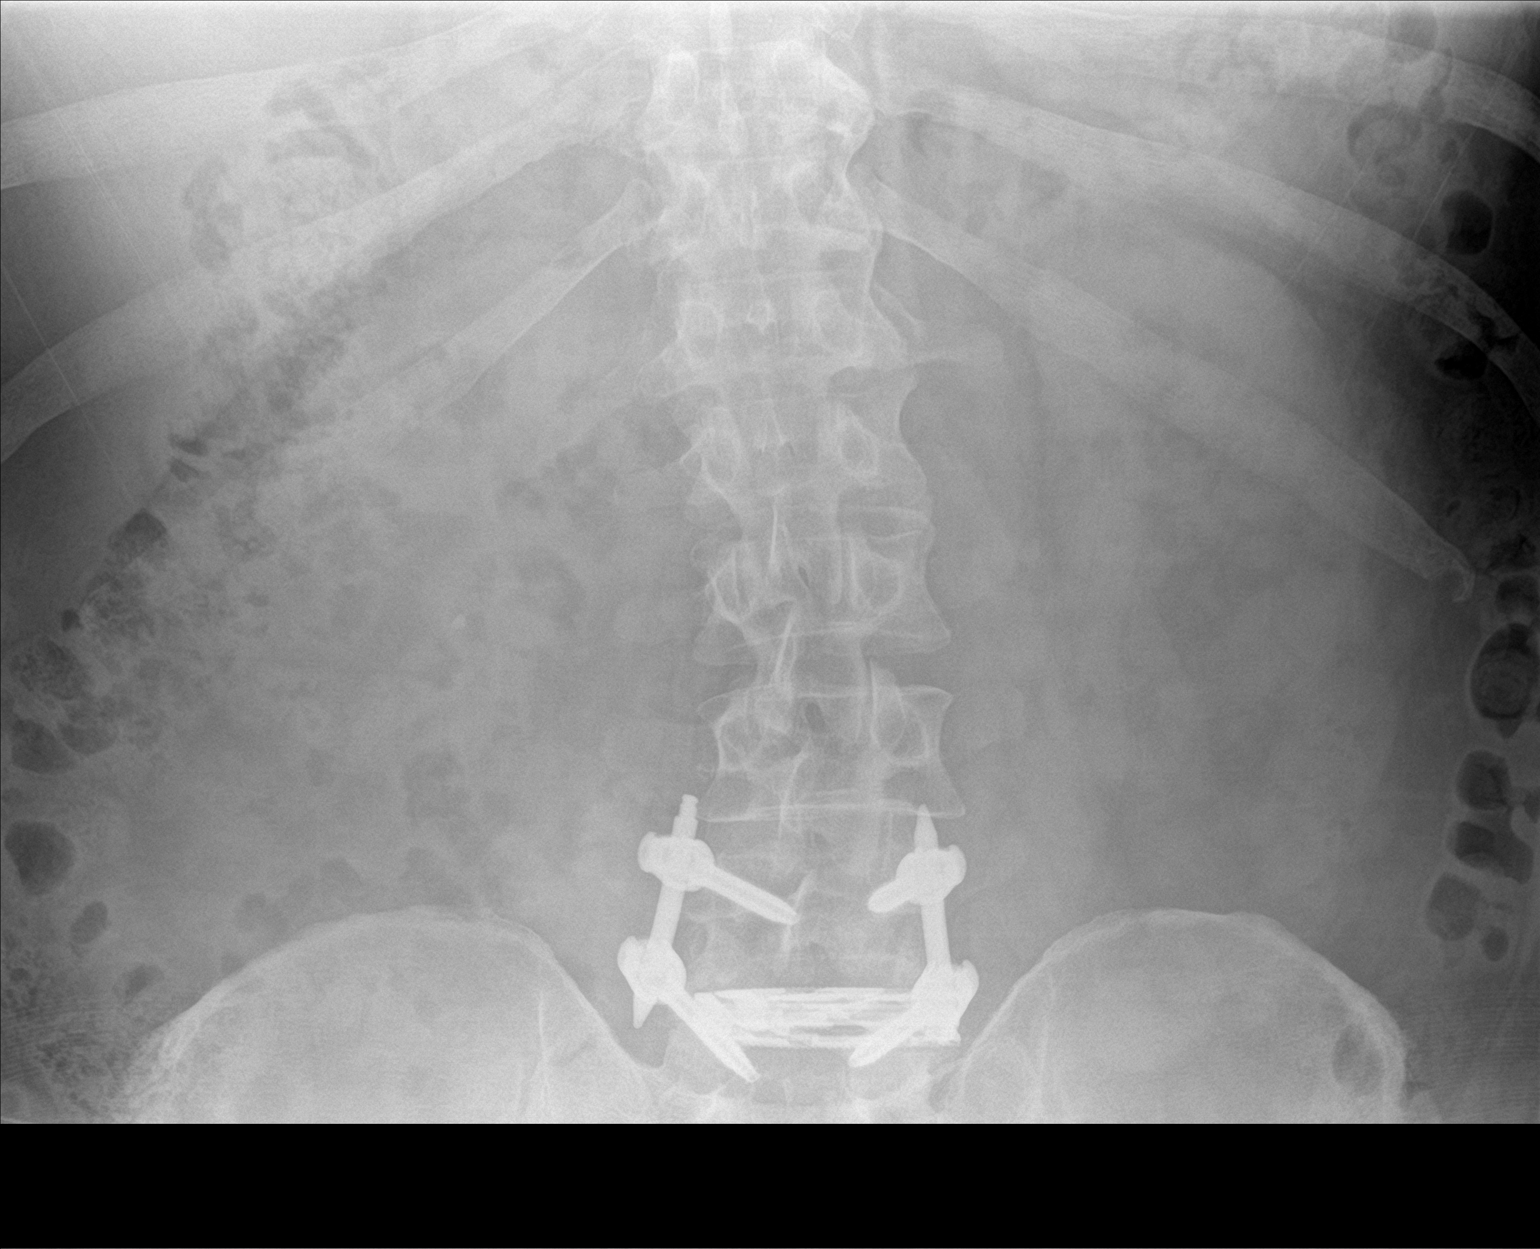

[2 of 2 positions shown; findings below may reference images not displayed]

FINDINGS: The bowel gas pattern is normal. A large amount of stool is seen
throughout the colon. A 4 mm soft tissue calcification is seen
projecting over the expected region of the lower pole of the right
kidney. Bilateral radiopaque pedicle screws are seen at the levels
of L4 and L5.
IMPRESSION: 1. 4 mm renal calculus projecting over the expected region of the
lower pole of the right kidney.
2. Large stool burden without evidence of bowel obstruction.
3. Postoperative changes within the lower lumbar spine.

## 2020-06-18 MED ORDER — KETOROLAC TROMETHAMINE 10 MG PO TABS: 10.0000 mg | ORAL_TABLET | Freq: Four times a day (QID) | ORAL | 0 refills | Status: DC | PRN

## 2020-06-18 MED ORDER — OXYCODONE-ACETAMINOPHEN 5-325 MG PO TABS: 1.0000 | ORAL_TABLET | Freq: Four times a day (QID) | ORAL | 0 refills | Status: DC | PRN

## 2020-06-18 MED ORDER — TAMSULOSIN HCL 0.4 MG PO CAPS: 0.4000 mg | ORAL_CAPSULE | Freq: Every day | ORAL | 0 refills | Status: AC

## 2020-06-18 NOTE — Patient Instructions (Signed)
Laser Therapy for Kidney Stones Laser therapy for kidney stones is a procedure to break up small, hard mineral deposits that form in the kidney (kidney stones). The procedure is done using a device that produces a focused beam of light (laser). The laser breaks up kidney stones into pieces that are small enough to be passed out of the body through urination or removed from the body during the procedure. You may need laser therapy if you have kidney stones that are painful or block your urinary tract. This procedure is done by inserting a tube (ureteroscope) into your kidney through the urethral opening. The urethra is the part of the body that drains urine from the bladder. In women, the urethra opens above the vaginal opening. In men, the urethra opens at the tip of the penis. The ureteroscope is inserted through the urethra, and surgical instruments are moved through the bladder and the muscular tube that connects the kidney to the bladder (ureter) until they reach the kidney. Tell a health care provider about:  Any allergies you have.  All medicines you are taking, including vitamins, herbs, eye drops, creams, and over-the-counter medicines.  Any problems you or family members have had with anesthetic medicines.  Any blood disorders you have.  Any surgeries you have had.  Any medical conditions you have.  Whether you are pregnant or may be pregnant. What are the risks? Generally, this is a safe procedure. However, problems may occur, including:  Infection.  Bleeding.  Allergic reactions to medicines.  Damage to the urethra, bladder, or ureter.  Urinary tract infection (UTI).  Narrowing of the urethra (urethral stricture).  Difficulty passing urine.  Blockage of the kidney caused by a fragment of kidney stone. What happens before the procedure? Medicines  Ask your health care provider about: ? Changing or stopping your regular medicines. This is especially important if you  are taking diabetes medicines or blood thinners. ? Taking medicines such as aspirin and ibuprofen. These medicines can thin your blood. Do not take these medicines unless your health care provider tells you to take them. ? Taking over-the-counter medicines, vitamins, herbs, and supplements. Eating and drinking Follow instructions from your health care provider about eating and drinking, which may include:  8 hours before the procedure - stop eating heavy meals or foods, such as meat, fried foods, or fatty foods.  6 hours before the procedure - stop eating light meals or foods, such as toast or cereal.  6 hours before the procedure - stop drinking milk or drinks that contain milk.  2 hours before the procedure - stop drinking clear liquids. Staying hydrated Follow instructions from your health care provider about hydration, which may include:  Up to 2 hours before the procedure - you may continue to drink clear liquids, such as water, clear fruit juice, black coffee, and plain tea.   General instructions  You may have a physical exam before the procedure. You may also have tests, such as imaging tests and blood or urine tests.  If your ureter is too narrow, your health care provider may place a soft, flexible tube (stent) inside of it. The stent may be placed days or weeks before your laser therapy procedure.  Plan to have someone take you home from the hospital or clinic.  If you will be going home right after the procedure, plan to have someone stay with you for 24 hours.  Do not use any products that contain nicotine or tobacco for at least  4 weeks before the procedure. These products include cigarettes, e-cigarettes, and chewing tobacco. If you need help quitting, ask your health care provider.  Ask your health care provider: ? How your surgical site will be marked or identified. ? What steps will be taken to help prevent infection. These may include:  Removing hair at the surgery  site.  Washing skin with a germ-killing soap.  Taking antibiotic medicine. What happens during the procedure?  An IV will be inserted into one of your veins.  You will be given one or more of the following: ? A medicine to help you relax (sedative). ? A medicine to numb the area (local anesthetic). ? A medicine to make you fall asleep (general anesthetic).  A ureteroscope will be inserted into your urethra. The ureteroscope will send images to a video screen in the operating room to guide your surgeon to the area of your kidney that will be treated.  A small, flexible tube will be threaded through the ureteroscope and into your bladder and ureter, up to your kidney.  The laser device will be inserted into your kidney through the tube. Your surgeon will pulse the laser on and off to break up kidney stones.  A surgical instrument that has a tiny wire basket may be inserted through the tube into your kidney to remove the pieces of broken kidney stone. The procedure may vary among health care providers and hospitals.   What happens after the procedure?  Your blood pressure, heart rate, breathing rate, and blood oxygen level will be monitored until you leave the hospital or clinic.  You will be given pain medicine as needed.  You may continue to receive antibiotics.  You may have a stent temporarily placed in your ureter.  Do not drive for 24 hours if you were given a sedative during your procedure.  You may be given a strainer to collect any stone fragments that you pass in your urine. Your health care provider may have these tested. Summary  Laser therapy for kidney stones is a procedure to break up kidney stones into pieces that are small enough to be passed out of the body through urination or removed during the procedure.  Follow instructions from your health care provider about eating and drinking before the procedure.  During the procedure, the ureteroscope will send images  to a video screen to guide your surgeon to the area of your kidney that will be treated.  Do not drive for 24 hours if you were given a sedative during your procedure. This information is not intended to replace advice given to you by your health care provider. Make sure you discuss any questions you have with your health care provider. Document Revised: 10/27/2017 Document Reviewed: 10/27/2017 Elsevier Patient Education  2021 [place].   Ureteral Stent Implantation  Ureteral stent implantation is a procedure to insert (implant) a flexible, soft, plastic tube (stent) into a ureter. Ureters are the tube-like parts of the body that drain urine from the kidneys. The stent supports the ureter while it heals and helps to drain urine. You may have a ureteral stent implanted after having a procedure to remove a blockage from the ureter (ureterolysis or pyeloplasty). You may also have a stent implanted to open the flow of urine when you have a blockage caused by a kidney stone, tumor, blood clot, or infection. You have two ureters, one on each side of the body. The ureters connect the kidneys to the organ that  holds urine until it passes out of the body (bladder). The stent is placed so that one end is in the kidney, and one end is in the bladder. The stent is usually taken out after your ureter has healed. Depending on your condition, you may have a stent for just a few weeks, or you may have a long-term stent that will need to be replaced every few months. Tell a health care provider about:  Any allergies you have.  All medicines you are taking, including vitamins, herbs, eye drops, creams, and over-the-counter medicines.  Any problems you or family members have had with anesthetic medicines.  Any blood disorders you have.  Any surgeries you have had.  Any medical conditions you have.  Whether you are pregnant or may be pregnant. What are the risks? Generally, this is a safe procedure.  However, problems may occur, including:  Infection.  Bleeding.  Allergic reactions to medicines.  Damage to other structures or organs. Tearing (perforation) of the ureter is possible.  Movement of the stent away from where it is placed during surgery (migration). What happens before the procedure? Medicines Ask your health care provider about:  Changing or stopping your regular medicines. This is especially important if you are taking diabetes medicines or blood thinners.  Taking medicines such as aspirin and ibuprofen. These medicines can thin your blood. Do not take these medicines unless your health care provider tells you to take them.  Taking over-the-counter medicines, vitamins, herbs, and supplements. Eating and drinking Follow instructions from your health care provider about eating and drinking, which may include:  8 hours before the procedure - stop eating heavy meals or foods, such as meat, fried foods, or fatty foods.  6 hours before the procedure - stop eating light meals or foods, such as toast or cereal.  6 hours before the procedure - stop drinking milk or drinks that contain milk.  2 hours before the procedure - stop drinking clear liquids. Staying hydrated Follow instructions from your health care provider about hydration, which may include:  Up to 2 hours before the procedure - you may continue to drink clear liquids, such as water, clear fruit juice, black coffee, and plain tea. General instructions  Do not drink alcohol.  Do not use any products that contain nicotine or tobacco for at least 4 weeks before the procedure. These products include cigarettes, e-cigarettes, and chewing tobacco. If you need help quitting, ask your health care provider.  You may have an exam or testing, such as imaging or blood tests.  Ask your health care provider what steps will be taken to help prevent infection. These may include: ? Removing hair at the surgery  site. ? Washing skin with a germ-killing soap. ? Taking antibiotic medicine.  Plan to have someone take you home from the hospital or clinic.  If you will be going home right after the procedure, plan to have someone with you for 24 hours. What happens during the procedure?  An IV will be inserted into one of your veins.  You may be given a medicine to help you relax (sedative).  You may be given a medicine to make you fall asleep (general anesthetic).  A thin, tube-shaped instrument with a light and tiny camera at the end (cystoscope) will be inserted into your urethra. The urethra is the tube that drains urine from the bladder out of the body. In men, the urethra opens at the end of the penis. In women, the  urethra opens in front of the vaginal opening.  The cystoscope will be passed into your bladder.  A thin wire (guide wire) will be passed through your bladder and into your ureter. This is used to guide the stent into your ureter.  The stent will be inserted into your ureter.  The guide wire and the cystoscope will be removed.  A flexible tube (catheter) may be inserted through your urethra so that one end is in your bladder. This helps to drain urine from your bladder. The procedure may vary among hospitals and health care providers. What happens after the procedure?  Your blood pressure, heart rate, breathing rate, and blood oxygen level will be monitored until you leave the hospital or clinic.  You may continue to receive medicine and fluids through an IV.  You may have some soreness or pain in your abdomen and urethra. Medicines will be available to help you.  You will be encouraged to get up and walk around as soon as you can.  You may have a catheter draining your urine.  You will have some blood in your urine.  Do not drive for 24 hours if you were given a sedative during your procedure. Summary  Ureteral stent implantation is a procedure to insert a flexible,  soft, plastic tube (stent) into a ureter.  You may have a stent implanted to support the ureter while it heals after a procedure or to open the flow of urine if there is a blockage.  Follow instructions from your health care provider about taking medicines and about eating and drinking before the procedure.  Depending on your condition, you may have a stent for just a few weeks, or you may have a long-term stent that will need to be replaced every few months. This information is not intended to replace advice given to you by your health care provider. Make sure you discuss any questions you have with your health care provider. Document Revised: 11/22/2017 Document Reviewed: 11/23/2017 Elsevier Patient Education  2021 [place].   Kidney Stones Kidney stones are rock-like masses that form inside of the kidneys. Kidneys are organs that make pee (urine). A kidney stone may move into other parts of the urinary tract, including:  The tubes that connect the kidneys to the bladder (ureters).  The bladder.  The tube that carries urine out of the body (urethra). Kidney stones can cause very bad pain and can block the flow of pee. The stone usually leaves your body (passes) through your pee. You may need to have a doctor take out the stone. What are the causes? Kidney stones may be caused by:  A condition in which certain glands make too much parathyroid hormone (primary hyperparathyroidism).  A buildup of a type of crystals in the bladder made of a chemical called uric acid. The body makes uric acid when you eat certain foods.  Narrowing (stricture) of one or both of the ureters.  A kidney blockage that you were born with.  Past surgery on the kidney or the ureters, such as gastric bypass surgery. What increases the risk? You are more likely to develop this condition if:  You have had a kidney stone in the past.  You have a family history of kidney stones.  You do not drink enough  water.  You eat a diet that is high in protein, salt (sodium), or sugar.  You are overweight or very overweight (obese). What are the signs or symptoms? Symptoms of a  kidney stone may include:  Pain in the side of the belly, right below the ribs (flank pain). Pain usually spreads (radiates) to the groin.  Needing to pee often or right away (urgently).  Pain when going pee (urinating).  Blood in your pee (hematuria).  Feeling like you may vomit (nauseous).  Vomiting.  Fever and chills. How is this treated? Treatment depends on the size, location, and makeup of the kidney stones. The stones will often pass out of the body through peeing. You may need to:  Drink more fluid to help pass the stone. In some cases, you may be given fluids through an IV tube put into one of your veins at the hospital.  Take medicine for pain.  Make changes in your diet to help keep kidney stones from coming back. Sometimes, medical procedures are needed to remove a kidney stone. This may involve:  A procedure to break up kidney stones using a beam of light (laser) or shock waves.  Surgery to remove the kidney stones. Follow these instructions at home: Medicines  Take over-the-counter and prescription medicines only as told by your doctor.  Ask your doctor if the medicine prescribed to you requires you to avoid driving or using heavy machinery. Eating and drinking  Drink enough fluid to keep your pee pale yellow. You may be told to drink at least 8-10 glasses of water each day. This will help you pass the stone.  If told by your doctor, change your diet. This may include: ? Limiting how much salt you eat. ? Eating more fruits and vegetables. ? Limiting how much meat, poultry, fish, and eggs you eat.  Follow instructions from your doctor about eating or drinking restrictions. General instructions  Collect pee samples as told by your doctor. You may need to collect a pee sample: ? 24 hours  after a stone comes out. ? 8-12 weeks after a stone comes out, and every 6-12 months after that.  Strain your pee every time you pee (urinate), for as long as told. Use the strainer that your doctor recommends.  Do not throw out the stone. Keep it so that it can be tested by your doctor.  Keep all follow-up visits as told by your doctor. This is important. You may need follow-up tests. How is this prevented? To prevent another kidney stone:  Drink enough fluid to keep your pee pale yellow. This is the best way to prevent kidney stones.  Eat healthy foods.  Avoid certain foods as told by your doctor. You may be told to eat less protein.  Stay at a healthy weight.   Where to find more information  [place] [facility]): www.kidney.[email or web address]): www.urologyhealth.org Contact a doctor if:  You have pain that gets worse or does not get better with medicine. Get help right away if:  You have a fever or chills.  You get very bad pain.  You get new pain in your belly (abdomen).  You pass out (faint).  You cannot pee. Summary  Kidney stones are rock-like masses that form inside of the kidneys.  Kidney stones can cause very bad pain and can block the flow of pee.  The stones will often pass out of the body through peeing.  Drink enough fluid to keep your pee pale yellow. This information is not intended to replace advice given to you by your health care provider. Make sure you discuss any questions you have with your health care provider.  Document Revised: 07/04/2018 Document Reviewed: 07/04/2018 Elsevier Patient Education  [place].

## 2020-06-18 NOTE — Progress Notes (Signed)
06/18/20 4:25 PM   Manuel Cain Oct 20, 1970 [id number]  CC: Right ureteral stone  HPI: 50 year old male who presented to the ED on 06/14/2020 with acute onset of severe right-sided flank pain.  CT showed a 4 mm right mid ureteral stone with hydronephrosis, as well as a 3 mm right lower pole nonobstructive stone.  Urinalysis showed microscopic hematuria and labs were otherwise benign.  He was discharged with medical expulsive therapy.  He has continued to have intermittent renal colic on the right side and in the right lower abdomen.  He is taking Flomax, Toradol, and narcotics.  He denies any fevers or chills.  He is not having any significant urinary symptoms.  He denies any prior stone episodes.   PMH: Past Medical History:  Diagnosis Date  . Anxiety   . Blood in stool    dark pebble like per pt  x4 wks  . Chest pain    on going per pt-x 80mo  . Chronic ankle pain    Right, s/p OHY(0737)  . Chronic hip pain    right, s/p TGG(2694)  . Curvature of spine   . Dyspnea   . Esophagitis   . Frequent nosebleeds    x 2 mo per pt (march 2018)  . GERD (gastroesophageal reflux disease)   . Head injury 04/2016      . Hemorrhoids   . Hypertension   . Kidney stone   . Memory changes    x 1 yr-since 2017  . Migraine    since 2013  . Vasovagal syncope     Surgical History: Past Surgical History:  Procedure Laterality Date  . CHOLECYSTECTOMY N/A 08/23/2018   Procedure: LAPAROSCOPIC CHOLECYSTECTOMY;  Surgeon: Herbert Pun, MD;  Location: [facility];  Service: General;  Laterality: N/A;  . COLONOSCOPY WITH PROPOFOL N/A 12/16/2016   Procedure: COLONOSCOPY WITH PROPOFOL;  Surgeon: Lucilla Lame, MD;  Location: [facility];  Service: Endoscopy;  Laterality: N/A;  . ESOPHAGOGASTRODUODENOSCOPY (EGD) WITH PROPOFOL N/A 12/16/2016   Procedure: ESOPHAGOGASTRODUODENOSCOPY (EGD) WITH PROPOFOL;  Surgeon: Lucilla Lame, MD;  Location: [facility];  Service: Endoscopy;   Laterality: N/A;  . ESOPHAGOGASTRODUODENOSCOPY (EGD) WITH PROPOFOL N/A 08/09/2018   Procedure: ESOPHAGOGASTRODUODENOSCOPY (EGD) WITH PROPOFOL;  Surgeon: Toledo, Benay Pike, MD;  Location: [facility];  Service: Gastroenterology;  Laterality: N/A;  . KNEE SURGERY Left   . POLYPECTOMY  12/16/2016   Procedure: POLYPECTOMY;  Surgeon: Lucilla Lame, MD;  Location: [facility];  Service: Endoscopy;;  . vastectomy  1999    Family History: Family History  Problem Relation Age of Onset  . Hypertension Mother   . Breast cancer Mother   . CAD Father   . Heart attack Father   . Bladder Cancer Brother     Social History:  reports that he has never smoked. He has never used smokeless tobacco. He reports that he does not drink alcohol and does not use drugs.  Physical Exam: Ht 5\' 9"  (1.753 m)   Wt 277 lb 4.8 oz (125.8 kg)   BMI 40.95 kg/m    Constitutional:  Alert and oriented, No acute distress. Cardiovascular: No clubbing, cyanosis, or edema. Respiratory: Normal respiratory effort, no increased work of breathing. GI: Abdomen is soft, nontender, nondistended, no abdominal masses  Laboratory Data: Reviewed, see HPI  Pertinent Imaging: I have personally viewed and interpreted the CT as well as the KUB today.  On the CT there is a 4 mm right mid ureteral stone and right lower  pole nonobstructing 3 mm stone.  On KUB today it appears that the 4 mm stone has migrated more distally near the bladder.  Assessment & Plan:   50 year old male with 4 mm right mid ureteral stone, likely migrated to the right distal ureter on KUB today.  Pain currently moderately controlled and no clinical or laboratory evidence of infection.  We discussed various treatment options for urolithiasis including observation with or without medical expulsive therapy, shockwave lithotripsy (SWL), ureteroscopy and laser lithotripsy with stent placement, and percutaneous nephrolithotomy.  We discussed that management  is based on stone size, location, density, patient co-morbidities, and patient preference.   Stones <23mm in size have a >80% spontaneous passage rate. Data surrounding the use of tamsulosin for medical expulsive therapy is controversial, but meta analyses suggests it is most efficacious for distal stones between 5-69mm in size. Possible side effects include dizziness/lightheadedness, and retrograde ejaculation.  SWL has a lower stone free rate in a single procedure, but also a lower complication rate compared to ureteroscopy and avoids a stent and associated stent related symptoms. Possible complications include renal hematoma, steinstrasse, and need for additional treatment.  Ureteroscopy with laser lithotripsy and stent placement has a higher stone free rate than SWL in a single procedure, however increased complication rate including possible infection, ureteral injury, bleeding, and stent related morbidity. Common stent related symptoms include dysuria, urgency/frequency, and flank pain.  After an extensive discussion of the risks and benefits of the above treatment options, the patient would like to proceed with medical expulsive therapy.  Medications were refilled including Toradol, Flomax, and new prescription for Percocet.  -If he has uncontrolled pain, he will call our clinic to try to get added to the OR schedule for right ureteroscopy/laser/stent for either Friday or Monday -RTC 2 weeks with UA to confirm stone passage   Nickolas Madrid, MD 06/18/2020  [facility] [address] [place], [place] [address] [phone]

## 2020-07-03 ENCOUNTER — Ambulatory Visit: Payer: 59 | Admitting: Urology

## 2020-07-04 ENCOUNTER — Encounter: Payer: Self-pay | Admitting: Urology

## 2020-07-25 ENCOUNTER — Other Ambulatory Visit (HOSPITAL_COMMUNITY): Payer: Self-pay | Admitting: Family Medicine

## 2020-07-25 ENCOUNTER — Other Ambulatory Visit: Payer: Self-pay | Admitting: Family Medicine

## 2020-07-25 DIAGNOSIS — M25562 Pain in left knee: Secondary | ICD-10-CM

## 2020-08-01 ENCOUNTER — Ambulatory Visit: Admission: RE | Admit: 2020-08-01 | Payer: 59 | Source: Ambulatory Visit

## 2020-08-02 ENCOUNTER — Ambulatory Visit
Admission: RE | Admit: 2020-08-02 | Discharge: 2020-08-02 | Disposition: A | Discharge status: Home / Self Care | Payer: 59 | Source: Ambulatory Visit | Attending: Family Medicine | Admitting: Family Medicine

## 2020-08-02 ENCOUNTER — Other Ambulatory Visit: Payer: Self-pay

## 2020-08-02 DIAGNOSIS — M25562 Pain in left knee: Secondary | ICD-10-CM | POA: Insufficient documentation

## 2020-08-02 IMAGING — MR MR KNEE*L* W/O CM
6 series · 40 of 40 positions shown · non-contrast
Comparison: None.

CLINICAL DATA: Left knee pain and swelling since [DATE].

EXAM:
MRI OF THE LEFT KNEE WITHOUT CONTRAST
TECHNIQUE: Multiplanar, multisequence MR imaging of the knee was performed. No
intravenous contrast was administered.

[Series 8: T2 fat-sat · axial · left · 4.0mm · 0.50mm/px · z∈[-84,+41]mm · 6 of 26 slices shown (1 of 3)]
[im 1/26]
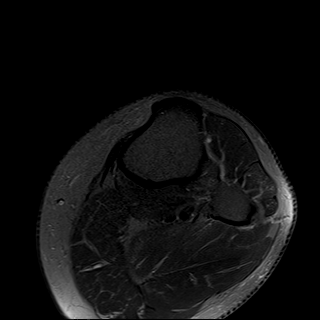
[im 6/26]
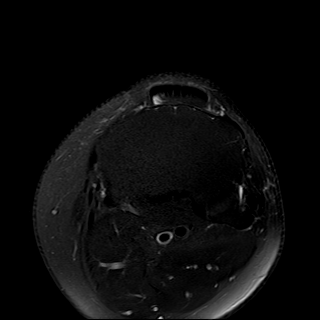
[im 11/26]
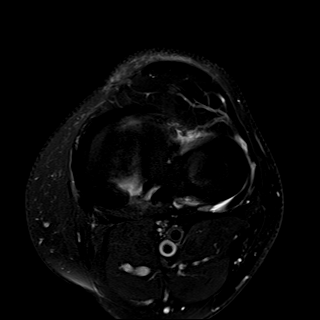
[im 16/26]
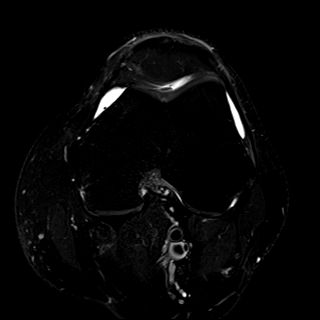
[im 21/26]
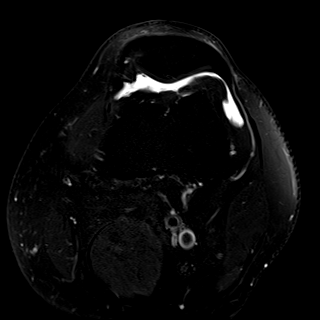
[im 26/26]
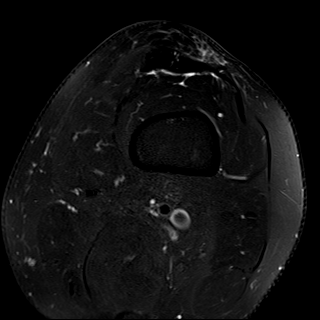

[Series 9: T2 fat-sat · coronal · left · 4.0mm · 0.59mm/px · 6 of 30 slices shown (2 of 3)]
[im 1/30]
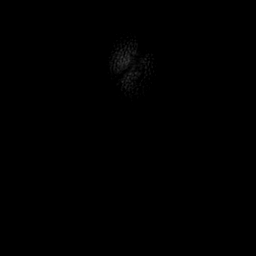
[im 6/30]
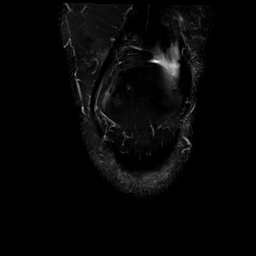
[im 12/30]
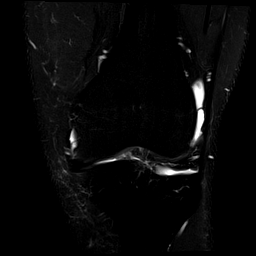
[im 18/30]
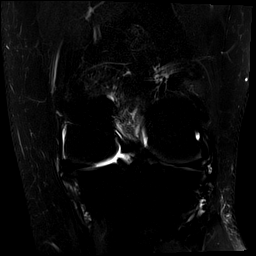
[im 24/30]
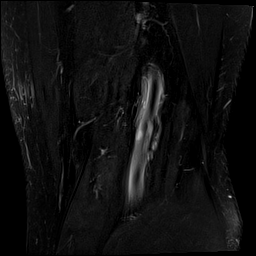
[im 30/30]
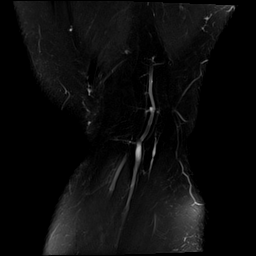

[Series 10: T1 · coronal · left · 4.0mm · 0.59mm/px · 6 of 30 slices shown]
[im 1/30]
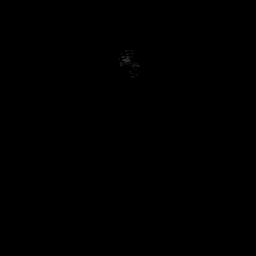
[im 6/30]
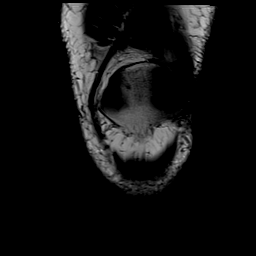
[im 12/30]
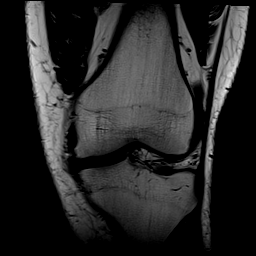
[im 18/30]
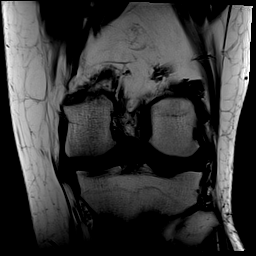
[im 24/30]
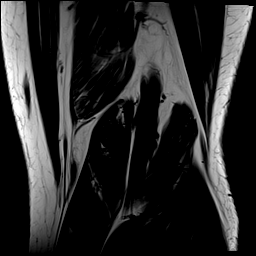
[im 30/30]
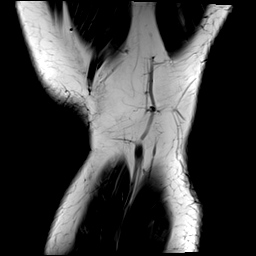

[Series 11: PD fat-sat · coronal · left · 4.0mm · 0.59mm/px · 6 of 30 slices shown (1 of 2)]
[im 1/30]
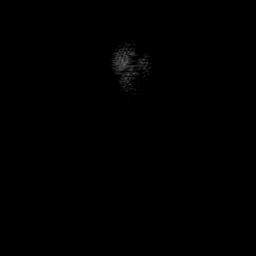
[im 6/30]
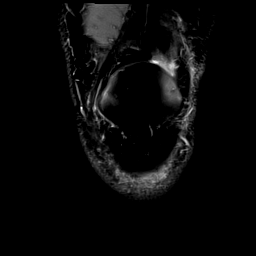
[im 12/30]
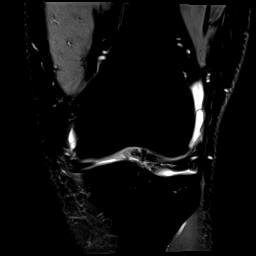
[im 18/30]
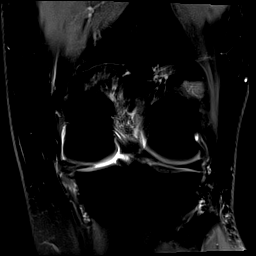
[im 24/30]
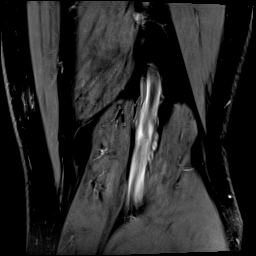
[im 30/30]
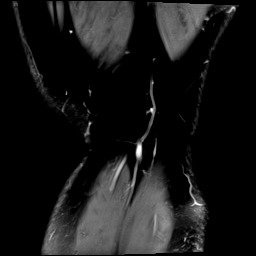

[Series 12: PD fat-sat · sagittal · left · 3.0mm · 0.59mm/px · 8 of 36 slices shown (2 of 2)]
[im 1/36]
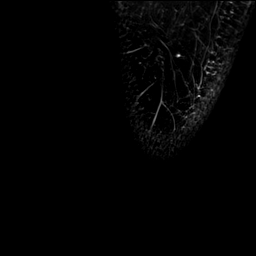
[im 6/36]
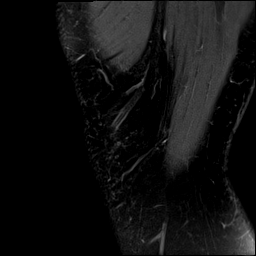
[im 11/36]
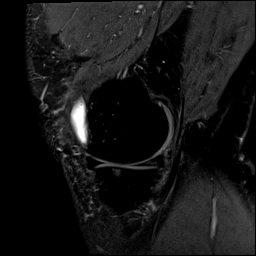
[im 16/36]
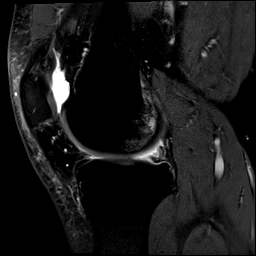
[im 21/36]
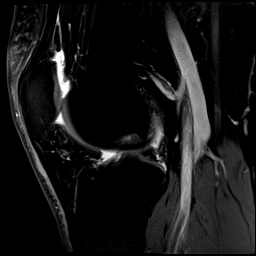
[im 26/36]
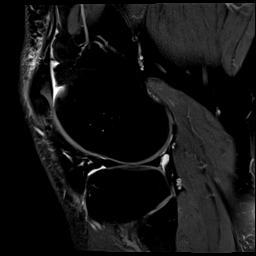
[im 31/36]
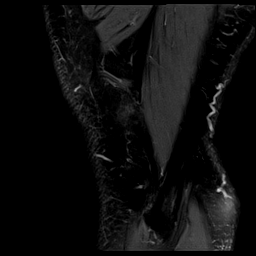
[im 36/36]
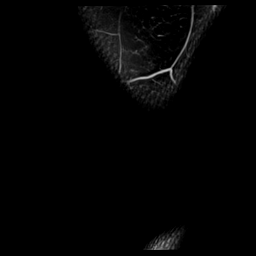

[Series 13: T2 fat-sat · sagittal · left · 3.0mm · 0.59mm/px · 8 of 36 slices shown (3 of 3)]
[im 1/36]
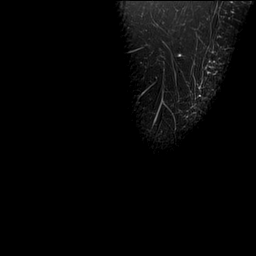
[im 6/36]
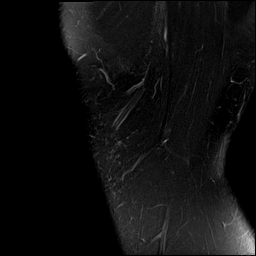
[im 11/36]
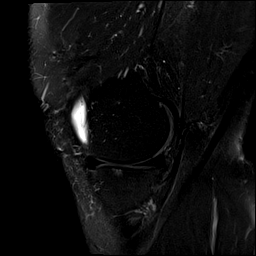
[im 16/36]
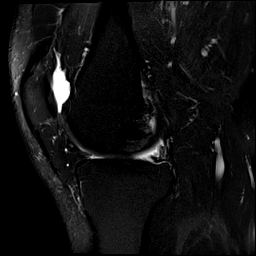
[im 21/36]
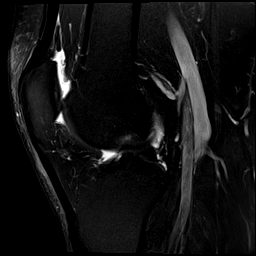
[im 26/36]
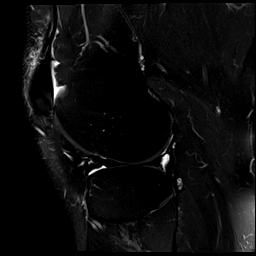
[im 31/36]
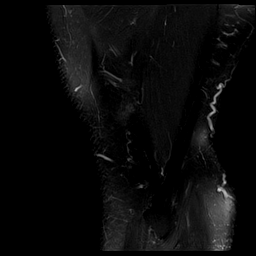
[im 36/36]
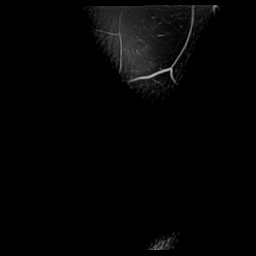

[40 of 40 positions shown; findings below may reference images not displayed]

FINDINGS: MENISCI

Medial meniscus: Large radial tear involving the posterior horn with
detachment from the meniscal root and medial protrusion of the
meniscus a maximum of 7 mm. The meniscus also demonstrates typical
mucinous degenerative changes.

Lateral meniscus:  Intact

LIGAMENTS

Cruciates:  Intact

Collaterals:  Intact

CARTILAGE

Patellofemoral:  Mild degenerative chondrosis.

Medial: Moderate degenerative chondrosis with early joint space
narrowing and spurring.

Lateral:  Mild degenerative chondrosis. Early spurring changes.

Joint:  Small joint effusion.

Popliteal Fossa:  No popliteal mass or Baker's cyst.

Extensor Mechanism: The patella retinacular structures are intact
and the quadriceps and patellar tendons are intact.

Bones:  No acute bony findings.

Other: Unremarkable knee musculature.
IMPRESSION: 1. Large radial tear involving the posterior horn of the medial
meniscus with associated medial protrusion and mucinous
degeneration.
2. Intact ligamentous structures and no acute bony findings.
3. Moderate medial compartment degenerative chondrosis.
4. Small joint effusion.

## 2020-11-04 DIAGNOSIS — G43809 Other migraine, not intractable, without status migrainosus: Secondary | ICD-10-CM | POA: Insufficient documentation

## 2021-04-17 DIAGNOSIS — H9201 Otalgia, right ear: Secondary | ICD-10-CM | POA: Diagnosis not present

## 2021-04-17 DIAGNOSIS — R6884 Jaw pain: Secondary | ICD-10-CM | POA: Diagnosis not present

## 2021-04-27 DIAGNOSIS — Z6841 Body Mass Index (BMI) 40.0 and over, adult: Secondary | ICD-10-CM | POA: Diagnosis not present

## 2021-04-27 DIAGNOSIS — I10 Essential (primary) hypertension: Secondary | ICD-10-CM | POA: Diagnosis not present

## 2021-04-27 DIAGNOSIS — M542 Cervicalgia: Secondary | ICD-10-CM | POA: Diagnosis not present

## 2021-04-27 DIAGNOSIS — D72829 Elevated white blood cell count, unspecified: Secondary | ICD-10-CM | POA: Diagnosis not present

## 2021-04-27 DIAGNOSIS — Z131 Encounter for screening for diabetes mellitus: Secondary | ICD-10-CM | POA: Diagnosis not present

## 2021-04-27 DIAGNOSIS — Z125 Encounter for screening for malignant neoplasm of prostate: Secondary | ICD-10-CM | POA: Diagnosis not present

## 2021-05-08 DIAGNOSIS — E781 Pure hyperglyceridemia: Secondary | ICD-10-CM | POA: Insufficient documentation

## 2021-06-08 DIAGNOSIS — D72829 Elevated white blood cell count, unspecified: Secondary | ICD-10-CM | POA: Diagnosis not present

## 2021-07-07 ENCOUNTER — Emergency Department
Admission: EM | Admit: 2021-07-07 | Discharge: 2021-07-07 | Disposition: A | Discharge status: Home / Self Care | Payer: 59 | Attending: Emergency Medicine | Admitting: Emergency Medicine

## 2021-07-07 ENCOUNTER — Encounter: Payer: Self-pay | Admitting: Emergency Medicine

## 2021-07-07 ENCOUNTER — Emergency Department: Payer: 59

## 2021-07-07 DIAGNOSIS — N200 Calculus of kidney: Secondary | ICD-10-CM

## 2021-07-07 DIAGNOSIS — N132 Hydronephrosis with renal and ureteral calculous obstruction: Secondary | ICD-10-CM | POA: Insufficient documentation

## 2021-07-07 DIAGNOSIS — N179 Acute kidney failure, unspecified: Secondary | ICD-10-CM | POA: Diagnosis not present

## 2021-07-07 DIAGNOSIS — R1010 Upper abdominal pain, unspecified: Secondary | ICD-10-CM | POA: Diagnosis not present

## 2021-07-07 DIAGNOSIS — I10 Essential (primary) hypertension: Secondary | ICD-10-CM | POA: Diagnosis not present

## 2021-07-07 DIAGNOSIS — K760 Fatty (change of) liver, not elsewhere classified: Secondary | ICD-10-CM | POA: Diagnosis not present

## 2021-07-07 DIAGNOSIS — R1013 Epigastric pain: Secondary | ICD-10-CM | POA: Diagnosis not present

## 2021-07-07 DIAGNOSIS — Z981 Arthrodesis status: Secondary | ICD-10-CM | POA: Diagnosis not present

## 2021-07-07 DIAGNOSIS — M5127 Other intervertebral disc displacement, lumbosacral region: Secondary | ICD-10-CM | POA: Diagnosis not present

## 2021-07-07 LAB — URINALYSIS, ROUTINE W REFLEX MICROSCOPIC
Bilirubin Urine: NEGATIVE
Glucose, UA: NEGATIVE mg/dL
Hgb urine dipstick: NEGATIVE
Ketones, ur: NEGATIVE mg/dL
Leukocytes,Ua: NEGATIVE
Nitrite: NEGATIVE
Protein, ur: NEGATIVE mg/dL
Specific Gravity, Urine: 1.02 (ref 1.005–1.030)
pH: 5 (ref 5.0–8.0)

## 2021-07-07 IMAGING — CT CT RENAL STONE PROTOCOL
2 of 4 series · 16 of 46 positions shown, 18 images · non-contrast
Comparison: CT abdomen pelvis dated [DATE].

CLINICAL DATA: Flank pain.  Concern for kidney stone.



[Series 2: stone full standard · axial · 0.98mm/px · z∈[-633,-133]mm · 13 of 110 slices shown, 15 images]
[im 5/110  soft-tissue]
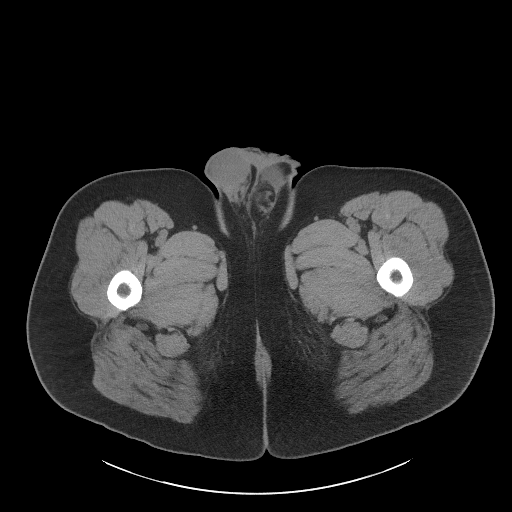
[im 5/110  bone]
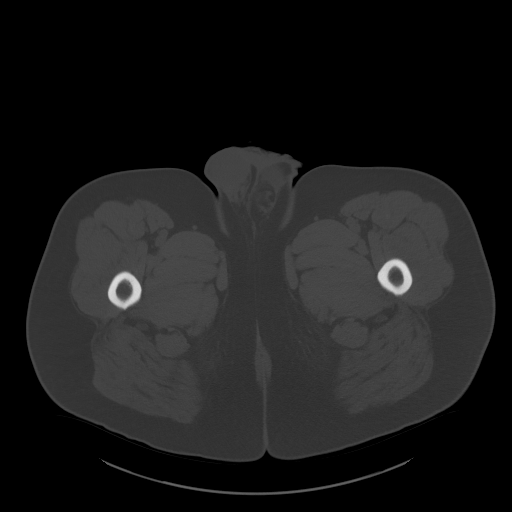
[im 14/110  soft-tissue]
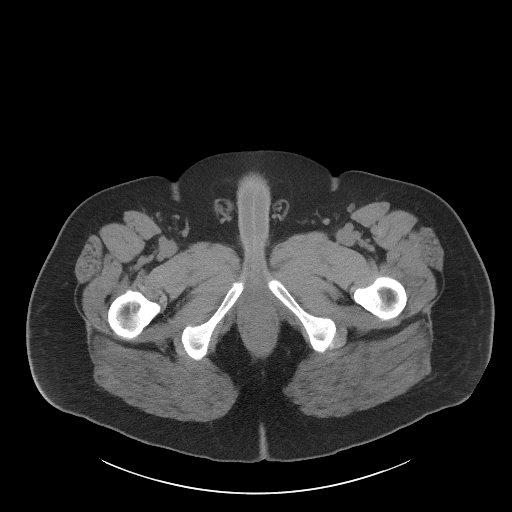
[im 23/110  soft-tissue]
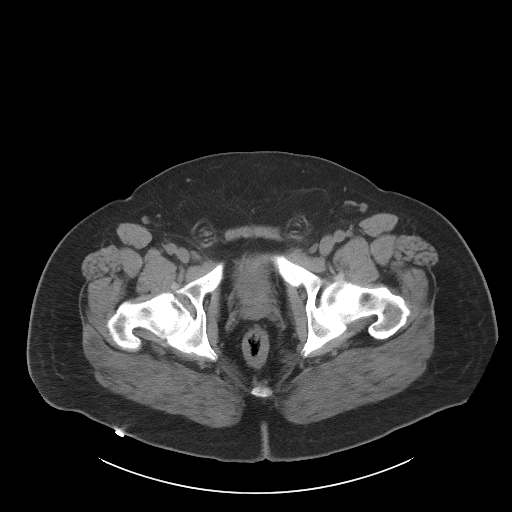
[im 32/110  soft-tissue]
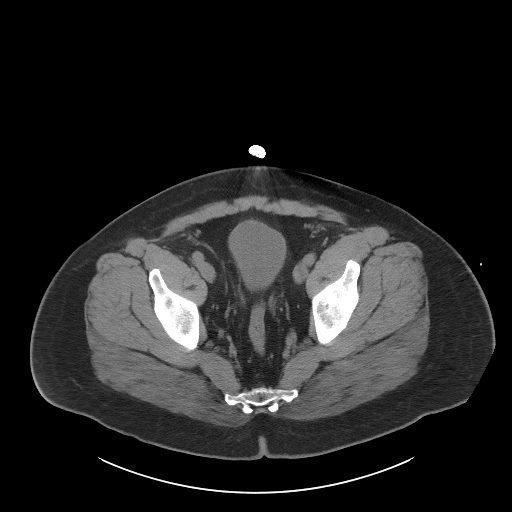
[im 37/110  soft-tissue]
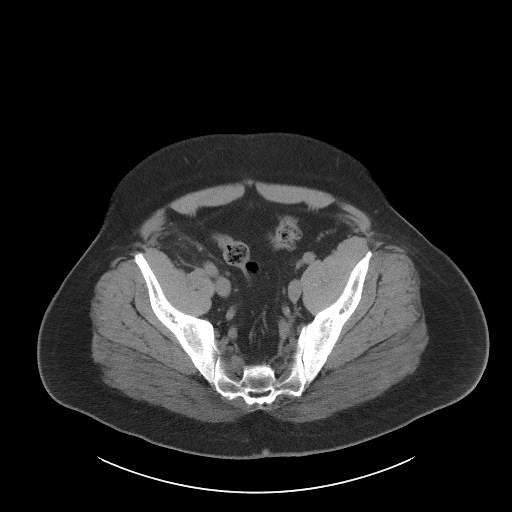
[im 46/110  soft-tissue]
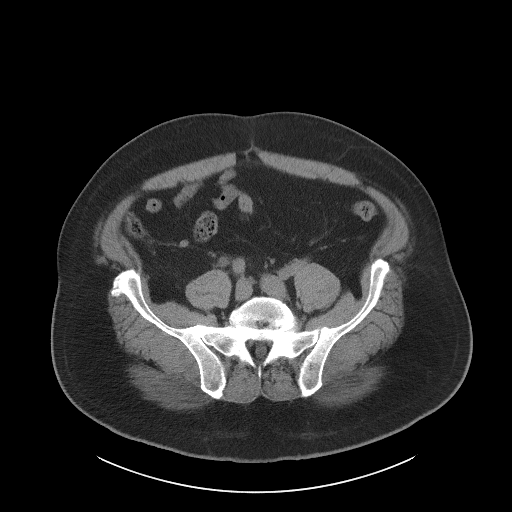
[im 55/110  soft-tissue]
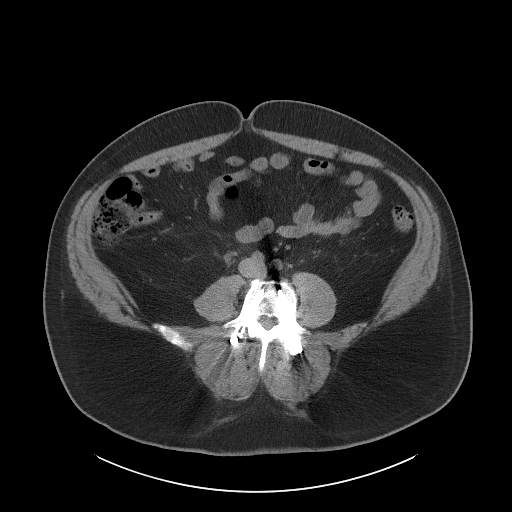
[im 64/110  soft-tissue]
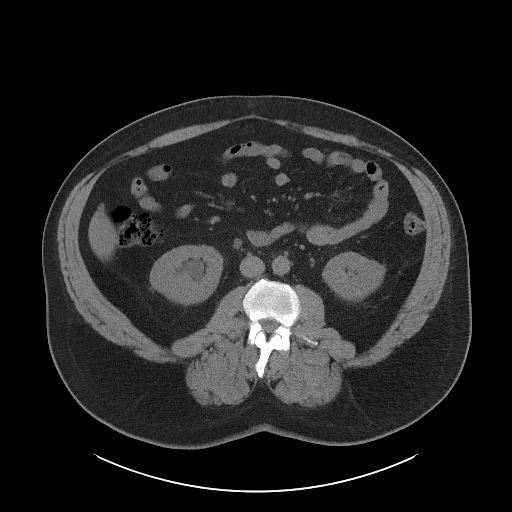
[im 73/110  soft-tissue]
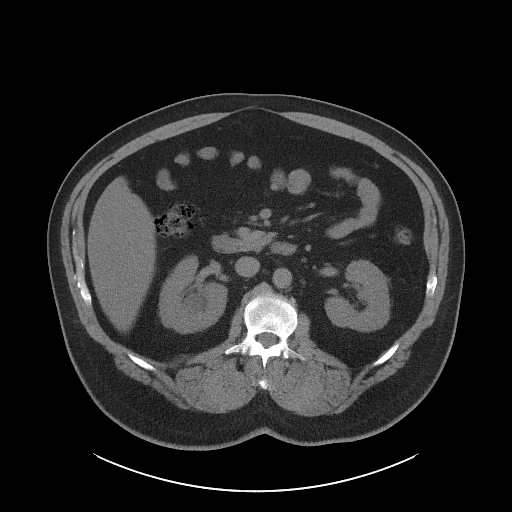
[im 73/110  bone]
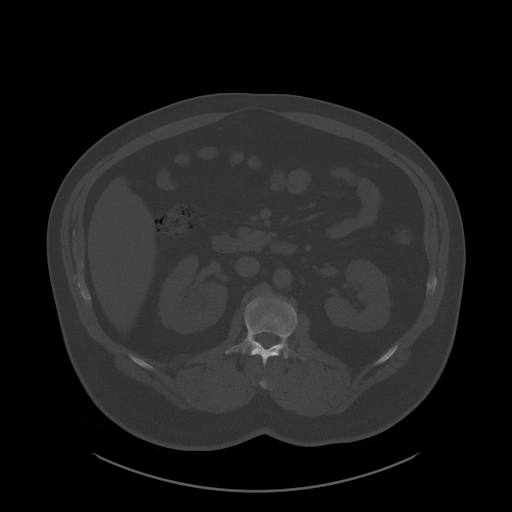
[im 78/110  soft-tissue]
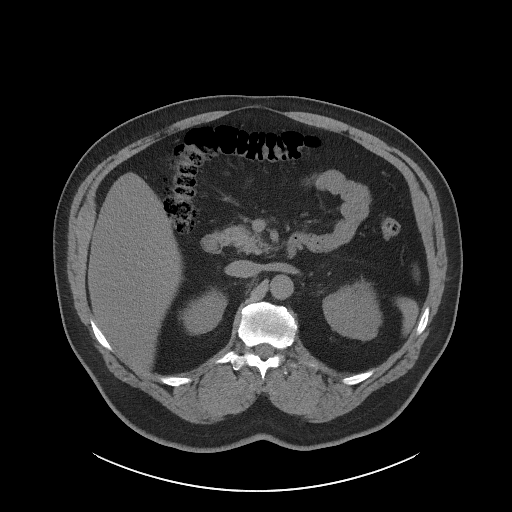
[im 87/110  soft-tissue]
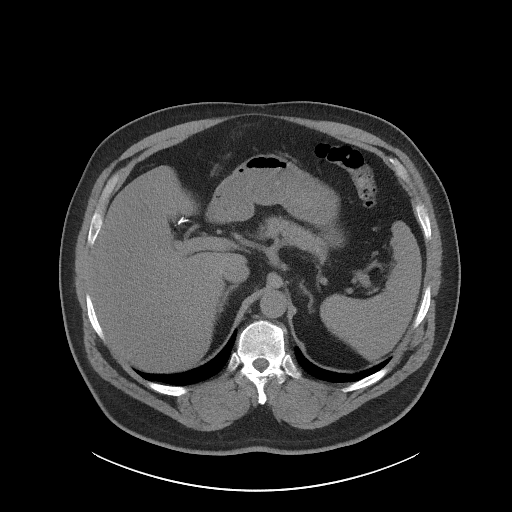
[im 96/110  soft-tissue]
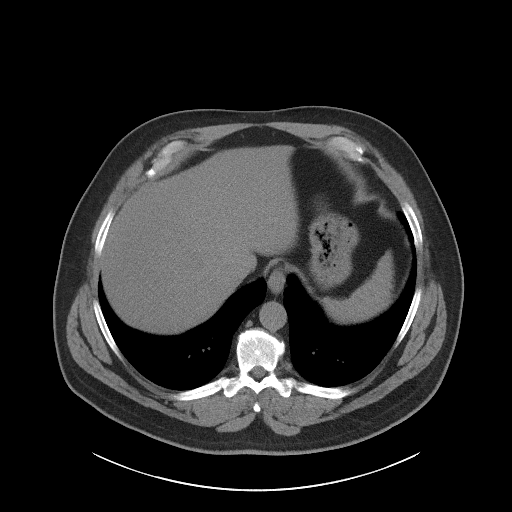
[im 105/110  soft-tissue]
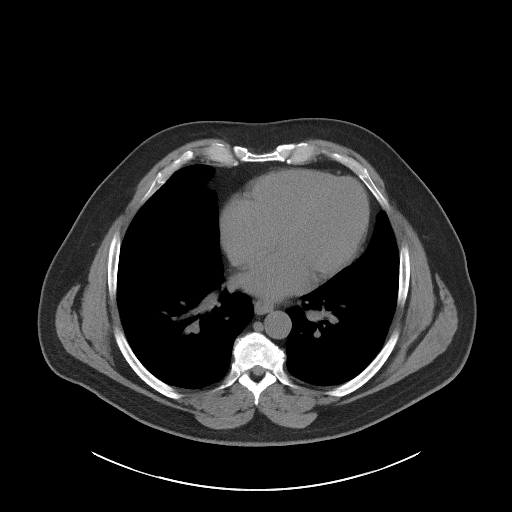

[Series 5: coronal · coronal · 0.90mm/px · 3 of 173 slices shown]
[im 58/173  soft-tissue]
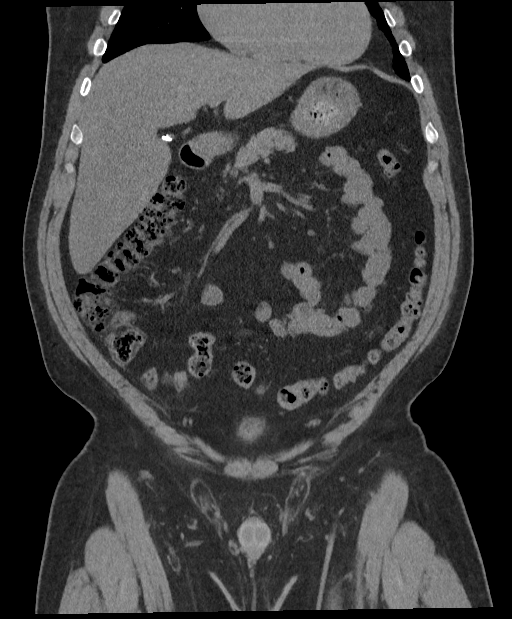
[im 77/173  soft-tissue]
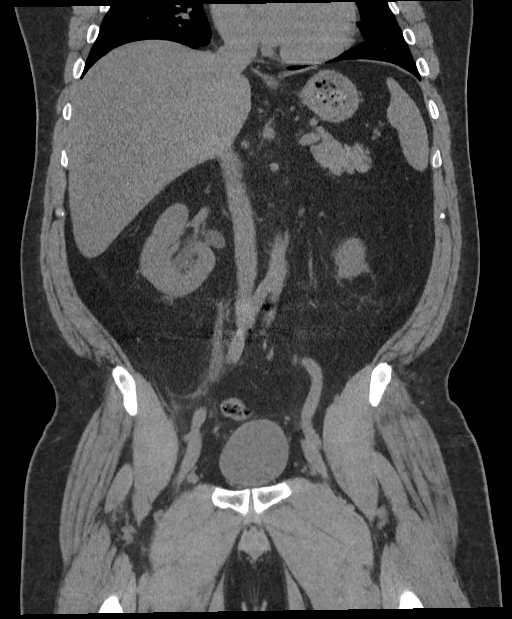
[im 96/173  soft-tissue]
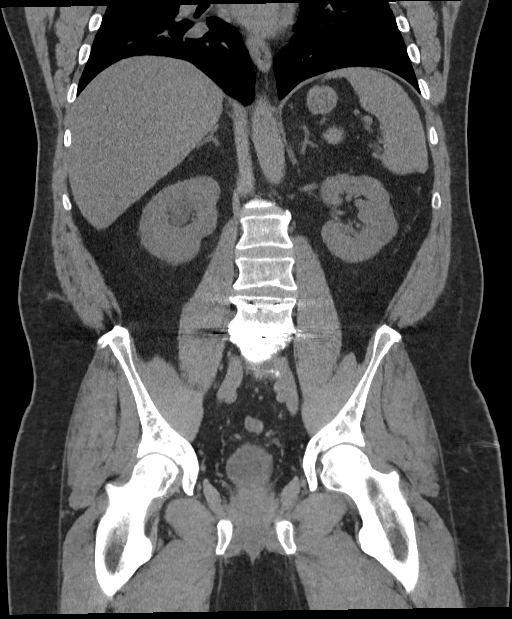

[16 of 46 positions shown; findings below may reference images not displayed]

FINDINGS: Evaluation of this exam is limited in the absence of intravenous
contrast.

Lower chest: The visualized lung bases are clear.

No intra-abdominal free air or free fluid.

Hepatobiliary: Fatty liver. No intrahepatic biliary dilatation.
Cholecystectomy. No retained calcified stone noted in the central
CBD.

Pancreas: Unremarkable. No pancreatic ductal dilatation or
surrounding inflammatory changes.

Spleen: Normal in size without focal abnormality.

Adrenals/Urinary Tract: The adrenal glands are unremarkable. There
is an 8 mm stone in the mid to distal right ureter with mild right
hydronephrosis. No additional stone noted in the right kidney. There
is a 3 mm nonobstructing left renal inferior pole calculus. No
hydronephrosis on the left both the left ureter and urinary bladder
appear unremarkable.

Stomach/Bowel: There is no bowel obstruction or active inflammation.
The appendix is normal.

Vascular/Lymphatic: The abdominal aorta and IVC are unremarkable. No
portal venous gas. There is no adenopathy.

Reproductive: The prostate and seminal vesicles are grossly
unremarkable. No pelvic mass.

Other: None

Musculoskeletal: L4-L5 disc spacer and posterior fusion. There is
disc desiccation and vacuum phenomena at L5-S1 with disc bulge. No
acute osseous pathology.
IMPRESSION: 1. A 8 mm stone in the mid to distal right ureter with mild right
hydronephrosis.
2. A 3 mm nonobstructing left renal inferior pole calculus. No
hydronephrosis on the left.
3. Fatty liver.
4. No bowel obstruction. Normal appendix.

## 2021-07-07 MED ORDER — TAMSULOSIN HCL 0.4 MG PO CAPS: 0.4000 mg | ORAL_CAPSULE | Freq: Every day | ORAL | 0 refills | Status: DC

## 2021-07-07 MED ORDER — ONDANSETRON HCL 4 MG PO TABS: 4.0000 mg | ORAL_TABLET | Freq: Every day | ORAL | 1 refills | Status: DC | PRN

## 2021-07-07 MED ORDER — OXYCODONE-ACETAMINOPHEN 5-325 MG PO TABS: 1.0000 | ORAL_TABLET | ORAL | 0 refills | Status: AC | PRN

## 2021-07-07 MED ORDER — IBUPROFEN 400 MG PO TABS: 400.0000 mg | ORAL_TABLET | Freq: Four times a day (QID) | ORAL | 0 refills | Status: DC | PRN

## 2021-07-07 NOTE — ED Provider Triage Note (Signed)
Emergency Medicine Provider Triage Evaluation Note ? ?Manuel Cain , a 51 y.o. male  was evaluated in triage.  Pt complains of abdominal pain and right flank pain..  Patient reports that he does have a history of kidney stones and feels that this pain is very similar.  He reports that he is having right lower abdominal pain that radiates into his right flank and lower back.  This started approximately 4 days ago.  He denies any dysuria/urinary frequency/urgency/burning/hematuria.  He reports that the pain is sharp in nature.  He did go to the urgent care today and was told that due to the fact that his white count was elevated and that his GFR was 4, creatinine was slightly elevated at 1.8 that he was in acute kidney failure and should report to the emergency room. ?Reports that he does have Toradol and Flomax at home which she has taken without relief of the pain. ?He denies nausea/vomiting/fever. ? ?Review of Systems  ?Positive: Positive for right lower quadrant/right flank/right lower back pain ?Negative: Negative for dysuria/hematuria/nausea/vomiting/fever ? ?Physical Exam  ?BP (!) 153/81   Pulse 74   Temp 98.7 ?F (37.1 ?C) (Oral)   Resp 18   Ht '5\' 9"'$  (1.753 m)   Wt 126 kg   SpO2 98%   BMI 41.02 kg/m?  ?Gen:   Awake, no distress   ?Resp:  Normal effort  ?MSK:   Moves extremities without difficulty  ?Other:   ? ?Medical Decision Making  ?Medically screening exam initiated at 8:31 PM.  Appropriate orders placed.  Manuel Cain was informed that the remainder of the evaluation will be completed by another provider, this initial triage assessment does not replace that evaluation, and the importance of remaining in the ED until their evaluation is complete. ? ?Labs already been obtained at the urgent care and are visible in epic ?Will obtain CT renal ?  ?Manuel Rayas, NP ?07/07/21 2034 ? ?

## 2021-07-07 NOTE — ED Notes (Signed)
Labs from 1 hour ago at [facility] - no labs obtained at this time per EDP.  ?

## 2021-07-07 NOTE — Discharge Instructions (Signed)
You have an 8 mm right-sided kidney stone.  This is a large kidney stone and you may have to have a procedure done to remove it.  Please follow-up with urology.  You can take the ibuprofen or Toradol for pain Zofran for nausea and the Flomax.  Please return to the emergency department for fever. ?

## 2021-07-07 NOTE — ED Notes (Signed)
E signature pad not working. Pt educated on discharge instructions and verbalized understanding.  

## 2021-07-07 NOTE — ED Triage Notes (Signed)
Pt presents via POV with complaints of "pressure" to his lower abdomen & right sided flank pain that started 4-5 days ago. He was seen at [facility] and was told he had an elevated WBC & was told he had an AKI. Hx of kidney stones and the pt took Toradol & Flomax PTA which helped some. Denies SOB or CP.  ?

## 2021-07-07 NOTE — ED Provider Notes (Addendum)
? ?[facility] ?Provider Note ? ? ? Event Date/Time  ? First MD Initiated Contact with Patient 07/07/21 2108   ?  (approximate) ? ? ?History  ? ?Abdominal Pain ? ? ?HPI ? ?Manuel Cain is a 51 y.o. male past medical history of kidney stones, hypertension who presents with abdominal pain.  Patient initially was seen at the walk-in clinic and was referred to the ED.  Symptoms started on Friday, 3 days ago.  He describes right flank/back pain as well as epigastric pain and bloating.  Has had some nausea but no vomiting.  Denies fevers chills.  No diarrhea constipation.  Patient has history of kidney stones this feels similar but also somewhat different and that he is feels more bloated.  Has never and required lithotripsy in the past. ?  ? ?Past Medical History:  ?Diagnosis Date  ? Anxiety   ? Blood in stool   ? dark pebble like per pt  x4 wks  ? Chest pain   ? on going per pt-x 79mo ? Chronic ankle pain   ? Right, s/p [id number]  ? Chronic hip pain   ? right, s/p [id number]  ? Curvature of spine   ? Dyspnea   ? Esophagitis   ? Frequent nosebleeds   ? x 2 mo per pt (march 2018)  ? GERD (gastroesophageal reflux disease)   ? Head injury 04/2016  ?    ? Hemorrhoids   ? Hypertension   ? Kidney stone   ? Memory changes   ? x 1 yr-since 2017  ? Migraine   ? since 2013  ? Vasovagal syncope   ? ? ?Patient Active Problem List  ? Diagnosis Date Noted  ? History of lumbar fusion 12/25/2019  ? Chronic nonintractable headache 07/15/2018  ? Acute upper respiratory infection 05/25/2018  ? Cough 05/25/2018  ? B12 deficiency 11/04/2017  ? Idiopathic peripheral neuropathy 11/03/2017  ? Sexual dysfunction 04/18/2017  ? Situational anxiety 04/18/2017  ? Hematochezia   ? Benign neoplasm of transverse colon   ? Rectal polyp   ? Gastroesophageal reflux disease   ? Vaccine counseling 06/10/2016  ? Essential hypertension 10/25/2015  ? Morbid obesity with BMI of 40.0-44.9, adult [place] 10/25/2015  ? Leukocytosis  10/25/2015  ? Chest pain, unspecified 10/24/2015  ? ? ? ?Physical Exam  ?Triage Vital Signs: ?ED Triage Vitals  ?Enc Vitals Group  ?   BP 07/07/21 1959 (!) 153/81  ?   Pulse Rate 07/07/21 1959 74  ?   Resp 07/07/21 1959 18  ?   Temp 07/07/21 1959 98.7 ?F (37.1 ?C)  ?   Temp Source 07/07/21 1959 Oral  ?   SpO2 07/07/21 1959 98 %  ?   Weight 07/07/21 2000 277 lb 12.5 oz (126 kg)  ?   Height 07/07/21 2000 '5\' 9"'$  (1.753 m)  ?   Head Circumference --   ?   Peak Flow --   ?   Pain Score 07/07/21 1959 7  ?   Pain Loc --   ?   Pain Edu? --   ?   [place]   ? ? ?Most recent vital signs: ?Vitals:  ? 07/07/21 1959  ?BP: (!) 153/81  ?Pulse: 74  ?Resp: 18  ?Temp: 98.7 ?F (37.1 ?C)  ?SpO2: 98%  ? ? ? ?General: Awake, no distress.  ?CV:  Good peripheral perfusion.  ?Resp:  Normal effort.  ?Abd:  No distention.  Abdomen is  benign, he has mild tenderness to deep palpation in the right upper quadrant and right epigastric region ?Neuro:             Awake, Alert, Oriented x 3  ?Other:  + RCVA ttp ? ? ?ED Results / Procedures / Treatments  ?Labs ?(all labs ordered are listed, but only abnormal results are displayed) ?Labs Reviewed  ?URINALYSIS, ROUTINE W REFLEX MICROSCOPIC - Abnormal; Notable for the following components:  ?    Result Value  ? Color, Urine YELLOW (*)   ? APPearance CLEAR (*)   ? All other components within normal limits  ?URINE CULTURE  ? ? ? ?EKG ? ? ? ? ?RADIOLOGY ?I reviewed the CT renal study which shows an 8 mm distal right ureteral stone with hydro- ? ? ?PROCEDURES: ? ?Critical Care performed: No ? ?Procedures ? ? ?MEDICATIONS ORDERED IN ED: ?Medications - No data to display ? ? ?IMPRESSION / MDM / ASSESSMENT AND PLAN / ED COURSE  ?I reviewed the triage vital signs and the nursing notes. ?             ?               ? ?Differential diagnosis includes, but is not limited to, kidney stone, pyelonephritis, less likely pancreatitis cholecystitis ? ?Patient is a 51 year old male with prior history of kidney  stones who presents with right flank as well as epigastric and generalized abdominal pain.  Has been intermittent since Friday.  Initially was relieved with Toradol.  Went to the walk-in clinic today and we have labs from their system which are showing a white count of 12 creatinine of 1.8 last was around 1.4 normal LFTs.  CT renal study performed which shows an 8 mm distal right ureteral stone with hydronephrosis.  My evaluation patient is quite well-appearing not in distress he is mildly hypertensive but otherwise vitals within normal limits.  He does have right CVA tenderness overall the abdominal exam is benign he has mild tenderness in the epigastric region to deep palpation.  UA from urgent care has no white cells will repeat.  Patient's pain is well controlled does not want anything for pain currently.  Patient prescribed Zofran Flomax and Percocet.  Advised that he avoid ibuprofen and Toradol given his kidney function was somewhat worse today.  He will need urology follow-up given the size of the stone I encouraged him to call to schedule an appointment tomorrow. ? ?UA is negative for white blood cells or blood.  This could be related to the degree of hydro.  He did have a leukocytosis at urgent care although white cell count is chronically elevated.  He is afebrile does not appear septic not a concern for septic stone at this point.  We will send a urine culture to have this.  He is appropriate for discharge given pain is controlled is tolerating p.o. and is not septic. ?  ? ? ?FINAL CLINICAL IMPRESSION(S) / ED DIAGNOSES  ? ?Final diagnoses:  ?Kidney stone  ? ? ? ?Rx / DC Orders  ? ?ED Discharge Orders   ? ?      Ordered  ?  ibuprofen (ADVIL) 400 MG tablet  Every 6 hours PRN,   Status:  Discontinued       ? 07/07/21 2314  ?  tamsulosin (FLOMAX) 0.4 MG CAPS capsule  Daily       ? 07/07/21 2314  ?  ondansetron (ZOFRAN) 4 MG tablet  Daily PRN       ?  07/07/21 2314  ?  oxyCODONE-acetaminophen (PERCOCET) 5-325  MG tablet  Every 4 hours PRN       ? 07/07/21 2341  ? ?  ?  ? ?  ? ? ? ?Note:  This document was prepared using Dragon voice recognition software and may include unintentional dictation errors. ?  ?Rada Hay, MD ?07/07/21 2315 ? ?  ?Rada Hay, MD ?07/07/21 2341 ? ?

## 2021-07-08 ENCOUNTER — Other Ambulatory Visit: Payer: Self-pay

## 2021-07-08 ENCOUNTER — Encounter: Payer: Self-pay | Admitting: Urology

## 2021-07-08 ENCOUNTER — Ambulatory Visit: Payer: 59 | Admitting: Urology

## 2021-07-08 VITALS — BP 150/84 | HR 50 | Ht 70.0 in | Wt 276.0 lb

## 2021-07-08 DIAGNOSIS — N201 Calculus of ureter: Secondary | ICD-10-CM

## 2021-07-08 MED ORDER — DIPHENHYDRAMINE HCL 25 MG PO CAPS: 25.0000 mg | ORAL_CAPSULE | ORAL | Status: DC

## 2021-07-08 MED ORDER — SODIUM CHLORIDE 0.9 % IV SOLN: INTRAVENOUS | Status: DC

## 2021-07-08 MED ORDER — CEPHALEXIN 500 MG PO CAPS: 500.0000 mg | ORAL_CAPSULE | Freq: Once | ORAL | Status: DC

## 2021-07-08 MED ORDER — ONDANSETRON HCL 4 MG/2ML IJ SOLN: 4.0000 mg | Freq: Once | INTRAMUSCULAR | Status: AC

## 2021-07-08 NOTE — Progress Notes (Signed)
[facility] ORDER FORM ? ?Expected date of procedure: 07/09/2021 ? ?Surgeon: John Giovanni, MD ? ?Post op standing: 2-4wk follow up w/KUB prior ? ?Anticoagulation/Aspirin/NSAID standing order: Hold all 72 hours prior ? ?Anesthesia standing order: MAC ? ?VTE standing: SCD's ? ?Dx: Right Ureteral Stone ? ?Procedure: right Extracorporeal shock wave lithotripsy ? ?CPT : [phone] ? ?Standing Order Set:  ? ?*NPO after mn, KUB ? ?*NS 140m/hr, Keflex 5068mPO, Benadryl 2512mO, Valium 68m75m, Zofran 4mg 77m? ? ? ?Medications if other than standing orders:   NONE ? ? ?

## 2021-07-08 NOTE — H&P (View-Only) (Signed)
? ?07/08/2021 ?12:12 PM  ? ?Manuel Cain ?Jun 05, 1970 ?[id number] ? ?Referring provider:  ?Dion Body, MD ?[place] ?[facility] ?[place],   [id number] ?Chief Complaint  ?Patient presents with  ? Nephrolithiasis  ? ? ? ?HPI: ?Manuel Cain is a 51 y.o.male who presents today for further evaluation of right ureteral stone and hydronephrosis.  ? ?He was seen in the ED yesterday. He presented with right flank/back pain as well as epigastric pain and bloating. He also had some nausea. Urinalysis was unremarkable. Urine was sent for culture. CT renal stone study visualized an 8 mm stone in the mid to distal right ureter with mild right hydronephrosis. No additional stone noted in the right kidney. There is a 3 mm nonobstructing left renal inferior pole calculus. No hydronephrosis on the left both the left ureter and urinary bladder ?appear unremarkable. He was discharged on flomax, zofran, oxycodone, and ibuprofen.  ? ?CT was personally reviewed today and stone to skin distance was 14 cm from anterior approch 18 from a posterior.  700 Hounsfield units. ? ?He reports that he has a history of stones. That the pain felt similar to previous stone episodes.  He reports it is possible that this pain started a month ago.  He had a similar episode but quickly resolved.  This most recent episode started on Friday has been intermittent. ? ?No dysuria, fevers or chills.  No nausea or vomiting. ? ? ?PMH: ?Past Medical History:  ?Diagnosis Date  ? Anxiety   ? Blood in stool   ? dark pebble like per pt  x4 wks  ? Chest pain   ? on going per pt-x 27mo ? Chronic ankle pain   ? Right, s/p [id number]  ? Chronic hip pain   ? right, s/p MYOV(7858  ? Curvature of spine   ? Dyspnea   ? Esophagitis   ? Frequent nosebleeds   ? x 2 mo per pt (march 2018)  ? GERD (gastroesophageal reflux disease)   ? Head injury 04/2016  ?    ? Hemorrhoids   ? Hypertension   ? Kidney stone   ? Memory changes   ? x 1 yr-since  2017  ? Migraine   ? since 2013  ? Vasovagal syncope   ? ? ?Surgical History: ?Past Surgical History:  ?Procedure Laterality Date  ? CHOLECYSTECTOMY N/A 08/23/2018  ? Procedure: LAPAROSCOPIC CHOLECYSTECTOMY;  Surgeon: CHerbert Pun MD;  Location: [facility];  Service: General;  Laterality: N/A;  ? COLONOSCOPY WITH PROPOFOL N/A 12/16/2016  ? Procedure: COLONOSCOPY WITH PROPOFOL;  Surgeon: WLucilla Lame MD;  [place] [facility]  Service: Endoscopy;  Laterality: N/A;  ? ESOPHAGOGASTRODUODENOSCOPY (EGD) WITH PROPOFOL N/A 12/16/2016  ? Procedure: ESOPHAGOGASTRODUODENOSCOPY (EGD) WITH PROPOFOL;  Surgeon: WLucilla Lame MD;  [place] [facility]  Service: Endoscopy;  Laterality: N/A;  ? ESOPHAGOGASTRODUODENOSCOPY (EGD) WITH PROPOFOL N/A 08/09/2018  ? Procedure: ESOPHAGOGASTRODUODENOSCOPY (EGD) WITH PROPOFOL;  Surgeon: Toledo, TBenay Pike MD;  Location: [facility] ENDOSCOPY;  Service: Gastroenterology;  Laterality: N/A;  ? KNEE SURGERY Left   ? POLYPECTOMY  12/16/2016  ? Procedure: POLYPECTOMY;  Surgeon: WLucilla Lame MD;  [place]  Service: Endoscopy;;  ? vastectomy  1999  ? ? ?Home Medications:  ?Allergies as of 07/08/2021   ? ?   Reactions  ? Valium [diazepam] Other (See Comments)  ? convulsions  ? ?  ? ?  ?Medication List  ?  ? ?  ? Accurate as of  Jul 08, 2021 12:12 PM. If you have any questions, ask your nurse or doctor.  ?  ?  ? ?  ? ?STOP taking these medications   ? ?albuterol 108 (90 Base) MCG/ACT inhaler ?Commonly known as: VENTOLIN HFA ?Stopped by: Hollice Espy, MD ?  ?celecoxib 200 MG capsule ?Commonly known as: CELEBREX ?Stopped by: Hollice Espy, MD ?  ?hydrochlorothiazide 12.5 MG tablet ?Commonly known as: HYDRODIURIL ?Stopped by: Hollice Espy, MD ?  ?PARoxetine 10 MG tablet ?Commonly known as: PAXIL ?Stopped by: Hollice Espy, MD ?  ?topiramate 50 MG tablet ?Commonly known as: TOPAMAX ?Stopped by: Hollice Espy, MD ?  ? ?  ? ?TAKE these medications   ? ?CENTRUM  SILVER 50+MEN PO ?Take 1 tablet by mouth daily. ?  ?ibuprofen 400 MG tablet ?Commonly known as: ADVIL ?Take by mouth. ?  ?ketorolac 10 MG tablet ?Commonly known as: TORADOL ?Take 1 tablet (10 mg total) by mouth every 6 (six) hours as needed. ?  ?lisinopril-hydrochlorothiazide 10-12.5 MG tablet ?Commonly known as: ZESTORETIC ?Take 1 tablet by mouth daily. ?  ?ondansetron 4 MG tablet ?Commonly known as: Zofran ?Take 1 tablet (4 mg total) by mouth daily as needed for nausea or vomiting. ?  ?oxyCODONE-acetaminophen 5-325 MG tablet ?Commonly known as: Percocet ?Take 1 tablet by mouth every 4 (four) hours as needed for up to 5 days for severe pain. ?What changed: Another medication with the same name was removed. Continue taking this medication, and follow the directions you see here. ?Changed by: Hollice Espy, MD ?  ?pantoprazole 20 MG tablet ?Commonly known as: PROTONIX ?Take 40 mg by mouth 2 (two) times daily before a meal. ?What changed: Another medication with the same name was removed. Continue taking this medication, and follow the directions you see here. ?Changed by: Hollice Espy, MD ?  ?tamsulosin 0.4 MG Caps capsule ?Commonly known as: FLOMAX ?Take 1 capsule (0.4 mg total) by mouth daily. ?  ?vitamin B-12 500 MCG tablet ?Commonly known as: CYANOCOBALAMIN ?Take 1,500 mcg by mouth daily. ?  ? ?  ? ? ?Allergies:  ?Allergies  ?Allergen Reactions  ? Valium [Diazepam] Other (See Comments)  ?  convulsions  ? ? ?Family History: ?Family History  ?Problem Relation Age of Onset  ? Hypertension Mother   ? Breast cancer Mother   ? CAD Father   ? Heart attack Father   ? Bladder Cancer Brother   ? ? ?Social History:  reports that he has never smoked. He has never used smokeless tobacco. He reports that he does not drink alcohol and does not use drugs. ? ? ?Physical Exam: ?BP (!) 150/84   Pulse (!) 50   Ht '5\' 10"'$  (1.778 m)   Wt 276 lb (125.2 kg)   BMI 39.60 kg/m?   ?Constitutional:  Alert and oriented, No acute  distress. ?HEENT: [place] AT, moist mucus membranes.  Trachea midline, no masses. ?Cardiovascular: No clubbing, cyanosis, or edema. ?Respiratory: Normal respiratory effort, no increased work of breathing. ?Skin: No rashes, bruises or suspicious lesions. ?Neurologic: Grossly intact, no focal deficits, moving all 4 extremities. ?Psychiatric: Normal mood and affect. ? ?Laboratory Data: ? ?Lab Results  ?Component Value Date  ? CREATININE 0.95 12/25/2019  ? ?No results found for: HGBA1C ? ?Pertinent Imaging: ?CLINICAL DATA:  Flank pain.  Concern for kidney stone. ?  ?EXAM: ?CT ABDOMEN AND PELVIS WITHOUT CONTRAST ?  ?TECHNIQUE: ?Multidetector CT imaging of the abdomen and pelvis was performed ?following the standard protocol without IV contrast. ?  ?  RADIATION DOSE REDUCTION: This exam was performed according to the ?departmental dose-optimization program which includes automated ?exposure control, adjustment of the mA and/or kV according to ?patient size and/or use of iterative reconstruction technique. ?  ?COMPARISON:  CT abdomen pelvis dated 06/14/2020. ?  ?FINDINGS: ?Evaluation of this exam is limited in the absence of intravenous ?contrast. ?  ?Lower chest: The visualized lung bases are clear. ?  ?No intra-abdominal free air or free fluid. ?  ?Hepatobiliary: Fatty liver. No intrahepatic biliary dilatation. ?Cholecystectomy. No retained calcified stone noted in the central ?CBD. ?  ?Pancreas: Unremarkable. No pancreatic ductal dilatation or ?surrounding inflammatory changes. ?  ?Spleen: Normal in size without focal abnormality. ?  ?Adrenals/Urinary Tract: The adrenal glands are unremarkable. There ?is an 8 mm stone in the mid to distal right ureter with mild right ?hydronephrosis. No additional stone noted in the right kidney. There ?is a 3 mm nonobstructing left renal inferior pole calculus. No ?hydronephrosis on the left both the left ureter and urinary bladder ?appear unremarkable. ?  ?Stomach/Bowel: There is no bowel  obstruction or active inflammation. ?The appendix is normal. ?  ?Vascular/Lymphatic: The abdominal aorta and IVC are unremarkable. No ?portal venous gas. There is no adenopathy. ?  ?Reproductive: The prostate and

## 2021-07-08 NOTE — Progress Notes (Signed)
? ?07/08/2021 ?12:12 PM  ? ?Manuel Cain ?10-24-1970 ?[id number] ? ?Referring provider:  ?Dion Body, MD ?[place] ?[facility] ?[place],  [place] [address] ?Chief Complaint  ?Patient presents with  ? Nephrolithiasis  ? ? ? ?HPI: ?Manuel Cain is a 51 y.o.male who presents today for further evaluation of right ureteral stone and hydronephrosis.  ? ?He was seen in the ED yesterday. He presented with right flank/back pain as well as epigastric pain and bloating. He also had some nausea. Urinalysis was unremarkable. Urine was sent for culture. CT renal stone study visualized an 8 mm stone in the mid to distal right ureter with mild right hydronephrosis. No additional stone noted in the right kidney. There is a 3 mm nonobstructing left renal inferior pole calculus. No hydronephrosis on the left both the left ureter and urinary bladder ?appear unremarkable. He was discharged on flomax, zofran, oxycodone, and ibuprofen.  ? ?CT was personally reviewed today and stone to skin distance was 14 cm from anterior approch 18 from a posterior.  700 Hounsfield units. ? ?He reports that he has a history of stones. That the pain felt similar to previous stone episodes.  He reports it is possible that this pain started a month ago.  He had a similar episode but quickly resolved.  This most recent episode started on Friday has been intermittent. ? ?No dysuria, fevers or chills.  No nausea or vomiting. ? ? ?PMH: ?Past Medical History:  ?Diagnosis Date  ? Anxiety   ? Blood in stool   ? dark pebble like per pt  x4 wks  ? Chest pain   ? on going per pt-x 42mo ? Chronic ankle pain   ? Right, s/p [id number]  ? Chronic hip pain   ? right, s/p [id number]  ? Curvature of spine   ? Dyspnea   ? Esophagitis   ? Frequent nosebleeds   ? x 2 mo per pt (march 2018)  ? GERD (gastroesophageal reflux disease)   ? Head injury 04/2016  ?    ? Hemorrhoids   ? Hypertension   ? Kidney stone   ? Memory changes   ? x 1 yr-since  2017  ? Migraine   ? since 2013  ? Vasovagal syncope   ? ? ?Surgical History: ?Past Surgical History:  ?Procedure Laterality Date  ? CHOLECYSTECTOMY N/A 08/23/2018  ? Procedure: LAPAROSCOPIC CHOLECYSTECTOMY;  Surgeon: CHerbert Pun MD;  Location: [facility];  Service: General;  Laterality: N/A;  ? COLONOSCOPY WITH PROPOFOL N/A 12/16/2016  ? Procedure: COLONOSCOPY WITH PROPOFOL;  Surgeon: WLucilla Lame MD;  Location[facility]  Service: Endoscopy;  Laterality: N/A;  ? ESOPHAGOGASTRODUODENOSCOPY (EGD) WITH PROPOFOL N/A 12/16/2016  ? Procedure: ESOPHAGOGASTRODUODENOSCOPY (EGD) WITH PROPOFOL;  Surgeon: WLucilla Lame MD;  [facility]  Service: Endoscopy;  Laterality: N/A;  ? ESOPHAGOGASTRODUODENOSCOPY (EGD) WITH PROPOFOL N/A 08/09/2018  ? Procedure: ESOPHAGOGASTRODUODENOSCOPY (EGD) WITH PROPOFOL;  Surgeon: Toledo, TBenay Pike MD;  Location: [facility];  Service: Gastroenterology;  Laterality: N/A;  ? KNEE SURGERY Left   ? POLYPECTOMY  12/16/2016  ? Procedure: POLYPECTOMY;  Surgeon: WLucilla Lame MD;  Location: [facility]  Service: Endoscopy;;  ? vastectomy  1999  ? ? ?Home Medications:  ?Allergies as of 07/08/2021   ? ?   Reactions  ? Valium [diazepam] Other (See Comments)  ? convulsions  ? ?  ? ?  ?Medication List  ?  ? ?  ? Accurate as of  Jul 08, 2021 12:12 PM. If you have any questions, ask your nurse or doctor.  ?  ?  ? ?  ? ?STOP taking these medications   ? ?albuterol 108 (90 Base) MCG/ACT inhaler ?Commonly known as: VENTOLIN HFA ?Stopped by: Hollice Espy, MD ?  ?celecoxib 200 MG capsule ?Commonly known as: CELEBREX ?Stopped by: Hollice Espy, MD ?  ?hydrochlorothiazide 12.5 MG tablet ?Commonly known as: HYDRODIURIL ?Stopped by: Hollice Espy, MD ?  ?PARoxetine 10 MG tablet ?Commonly known as: PAXIL ?Stopped by: Hollice Espy, MD ?  ?topiramate 50 MG tablet ?Commonly known as: TOPAMAX ?Stopped by: Hollice Espy, MD ?  ? ?  ? ?TAKE these medications   ? ?CENTRUM  SILVER 50+MEN PO ?Take 1 tablet by mouth daily. ?  ?ibuprofen 400 MG tablet ?Commonly known as: ADVIL ?Take by mouth. ?  ?ketorolac 10 MG tablet ?Commonly known as: TORADOL ?Take 1 tablet (10 mg total) by mouth every 6 (six) hours as needed. ?  ?lisinopril-hydrochlorothiazide 10-12.5 MG tablet ?Commonly known as: ZESTORETIC ?Take 1 tablet by mouth daily. ?  ?ondansetron 4 MG tablet ?Commonly known as: Zofran ?Take 1 tablet (4 mg total) by mouth daily as needed for nausea or vomiting. ?  ?oxyCODONE-acetaminophen 5-325 MG tablet ?Commonly known as: Percocet ?Take 1 tablet by mouth every 4 (four) hours as needed for up to 5 days for severe pain. ?What changed: Another medication with the same name was removed. Continue taking this medication, and follow the directions you see here. ?Changed by: Hollice Espy, MD ?  ?pantoprazole 20 MG tablet ?Commonly known as: PROTONIX ?Take 40 mg by mouth 2 (two) times daily before a meal. ?What changed: Another medication with the same name was removed. Continue taking this medication, and follow the directions you see here. ?Changed by: Hollice Espy, MD ?  ?tamsulosin 0.4 MG Caps capsule ?Commonly known as: FLOMAX ?Take 1 capsule (0.4 mg total) by mouth daily. ?  ?vitamin B-12 500 MCG tablet ?Commonly known as: CYANOCOBALAMIN ?Take 1,500 mcg by mouth daily. ?  ? ?  ? ? ?Allergies:  ?Allergies  ?Allergen Reactions  ? Valium [Diazepam] Other (See Comments)  ?  convulsions  ? ? ?Family History: ?Family History  ?Problem Relation Age of Onset  ? Hypertension Mother   ? Breast cancer Mother   ? CAD Father   ? Heart attack Father   ? Bladder Cancer Brother   ? ? ?Social History:  reports that he has never smoked. He has never used smokeless tobacco. He reports that he does not drink alcohol and does not use drugs. ? ? ?Physical Exam: ?BP (!) 150/84   Pulse (!) 50   Ht '5\' 10"'$  (1.778 m)   Wt 276 lb (125.2 kg)   BMI 39.60 kg/m?   ?Constitutional:  Alert and oriented, No acute  distress. ?HEENT: [place] AT, moist mucus membranes.  Trachea midline, no masses. ?Cardiovascular: No clubbing, cyanosis, or edema. ?Respiratory: Normal respiratory effort, no increased work of breathing. ?Skin: No rashes, bruises or suspicious lesions. ?Neurologic: Grossly intact, no focal deficits, moving all 4 extremities. ?Psychiatric: Normal mood and affect. ? ?Laboratory Data: ? ?Lab Results  ?Component Value Date  ? CREATININE 0.95 12/25/2019  ? ?No results found for: HGBA1C ? ?Pertinent Imaging: ?CLINICAL DATA:  Flank pain.  Concern for kidney stone. ?  ?EXAM: ?CT ABDOMEN AND PELVIS WITHOUT CONTRAST ?  ?TECHNIQUE: ?Multidetector CT imaging of the abdomen and pelvis was performed ?following the standard protocol without IV contrast. ?  ?  RADIATION DOSE REDUCTION: This exam was performed according to the ?departmental dose-optimization program which includes automated ?exposure control, adjustment of the mA and/or kV according to ?patient size and/or use of iterative reconstruction technique. ?  ?COMPARISON:  CT abdomen pelvis dated 06/14/2020. ?  ?FINDINGS: ?Evaluation of this exam is limited in the absence of intravenous ?contrast. ?  ?Lower chest: The visualized lung bases are clear. ?  ?No intra-abdominal free air or free fluid. ?  ?Hepatobiliary: Fatty liver. No intrahepatic biliary dilatation. ?Cholecystectomy. No retained calcified stone noted in the central ?CBD. ?  ?Pancreas: Unremarkable. No pancreatic ductal dilatation or ?surrounding inflammatory changes. ?  ?Spleen: Normal in size without focal abnormality. ?  ?Adrenals/Urinary Tract: The adrenal glands are unremarkable. There ?is an 8 mm stone in the mid to distal right ureter with mild right ?hydronephrosis. No additional stone noted in the right kidney. There ?is a 3 mm nonobstructing left renal inferior pole calculus. No ?hydronephrosis on the left both the left ureter and urinary bladder ?appear unremarkable. ?  ?Stomach/Bowel: There is no bowel  obstruction or active inflammation. ?The appendix is normal. ?  ?Vascular/Lymphatic: The abdominal aorta and IVC are unremarkable. No ?portal venous gas. There is no adenopathy. ?  ?Reproductive: The prostate and

## 2021-07-09 ENCOUNTER — Encounter
Admission: RE | Disposition: A | Discharge status: Home / Self Care | Payer: Self-pay | Source: Home / Self Care | Attending: Urology

## 2021-07-09 ENCOUNTER — Encounter: Payer: Self-pay | Admitting: Urology

## 2021-07-09 ENCOUNTER — Ambulatory Visit: Payer: 59

## 2021-07-09 ENCOUNTER — Ambulatory Visit
Admission: RE | Admit: 2021-07-09 | Discharge: 2021-07-09 | Disposition: A | Discharge status: Home / Self Care | Payer: 59 | Attending: Urology | Admitting: Urology

## 2021-07-09 ENCOUNTER — Other Ambulatory Visit: Payer: Self-pay

## 2021-07-09 DIAGNOSIS — I10 Essential (primary) hypertension: Secondary | ICD-10-CM | POA: Diagnosis not present

## 2021-07-09 DIAGNOSIS — E669 Obesity, unspecified: Secondary | ICD-10-CM | POA: Insufficient documentation

## 2021-07-09 DIAGNOSIS — N132 Hydronephrosis with renal and ureteral calculous obstruction: Secondary | ICD-10-CM | POA: Insufficient documentation

## 2021-07-09 DIAGNOSIS — Z01818 Encounter for other preprocedural examination: Secondary | ICD-10-CM | POA: Diagnosis not present

## 2021-07-09 DIAGNOSIS — Z87442 Personal history of urinary calculi: Secondary | ICD-10-CM | POA: Diagnosis not present

## 2021-07-09 DIAGNOSIS — N201 Calculus of ureter: Secondary | ICD-10-CM

## 2021-07-09 HISTORY — PX: EXTRACORPOREAL SHOCK WAVE LITHOTRIPSY: SHX1557

## 2021-07-09 LAB — URINE CULTURE: Culture: NO GROWTH

## 2021-07-09 IMAGING — CR DG ABDOMEN 1V
1 series · 2 of 2 positions shown · non-contrast
Comparison: Abdomen CT dated [DATE]

CLINICAL DATA: Preop right ureter stone

EXAM:
ABDOMEN - 1 VIEW

[Series 1: dg abd 1 view · 0.14mm/px · 2 of 2 slices shown]
[im 1/2]
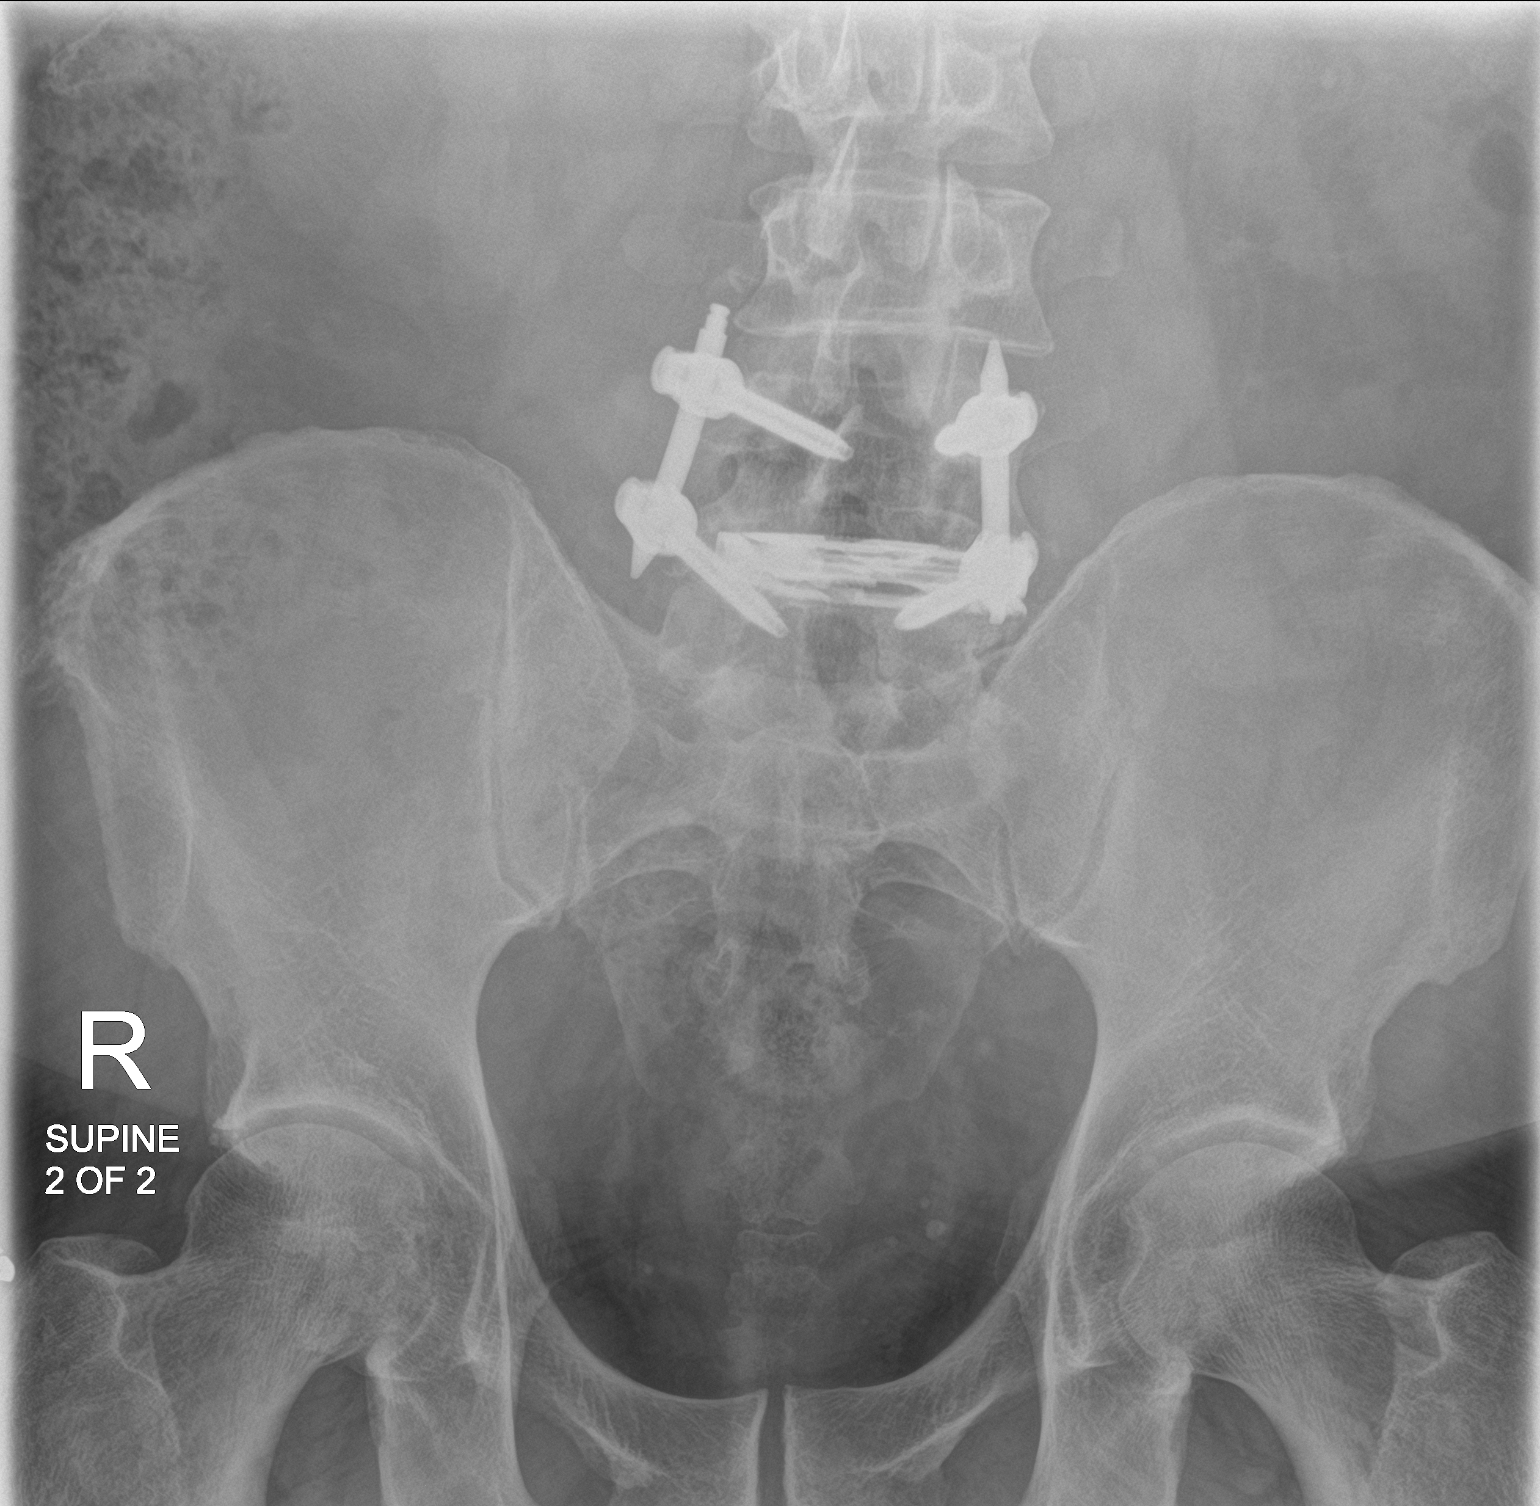
[im 2/2]
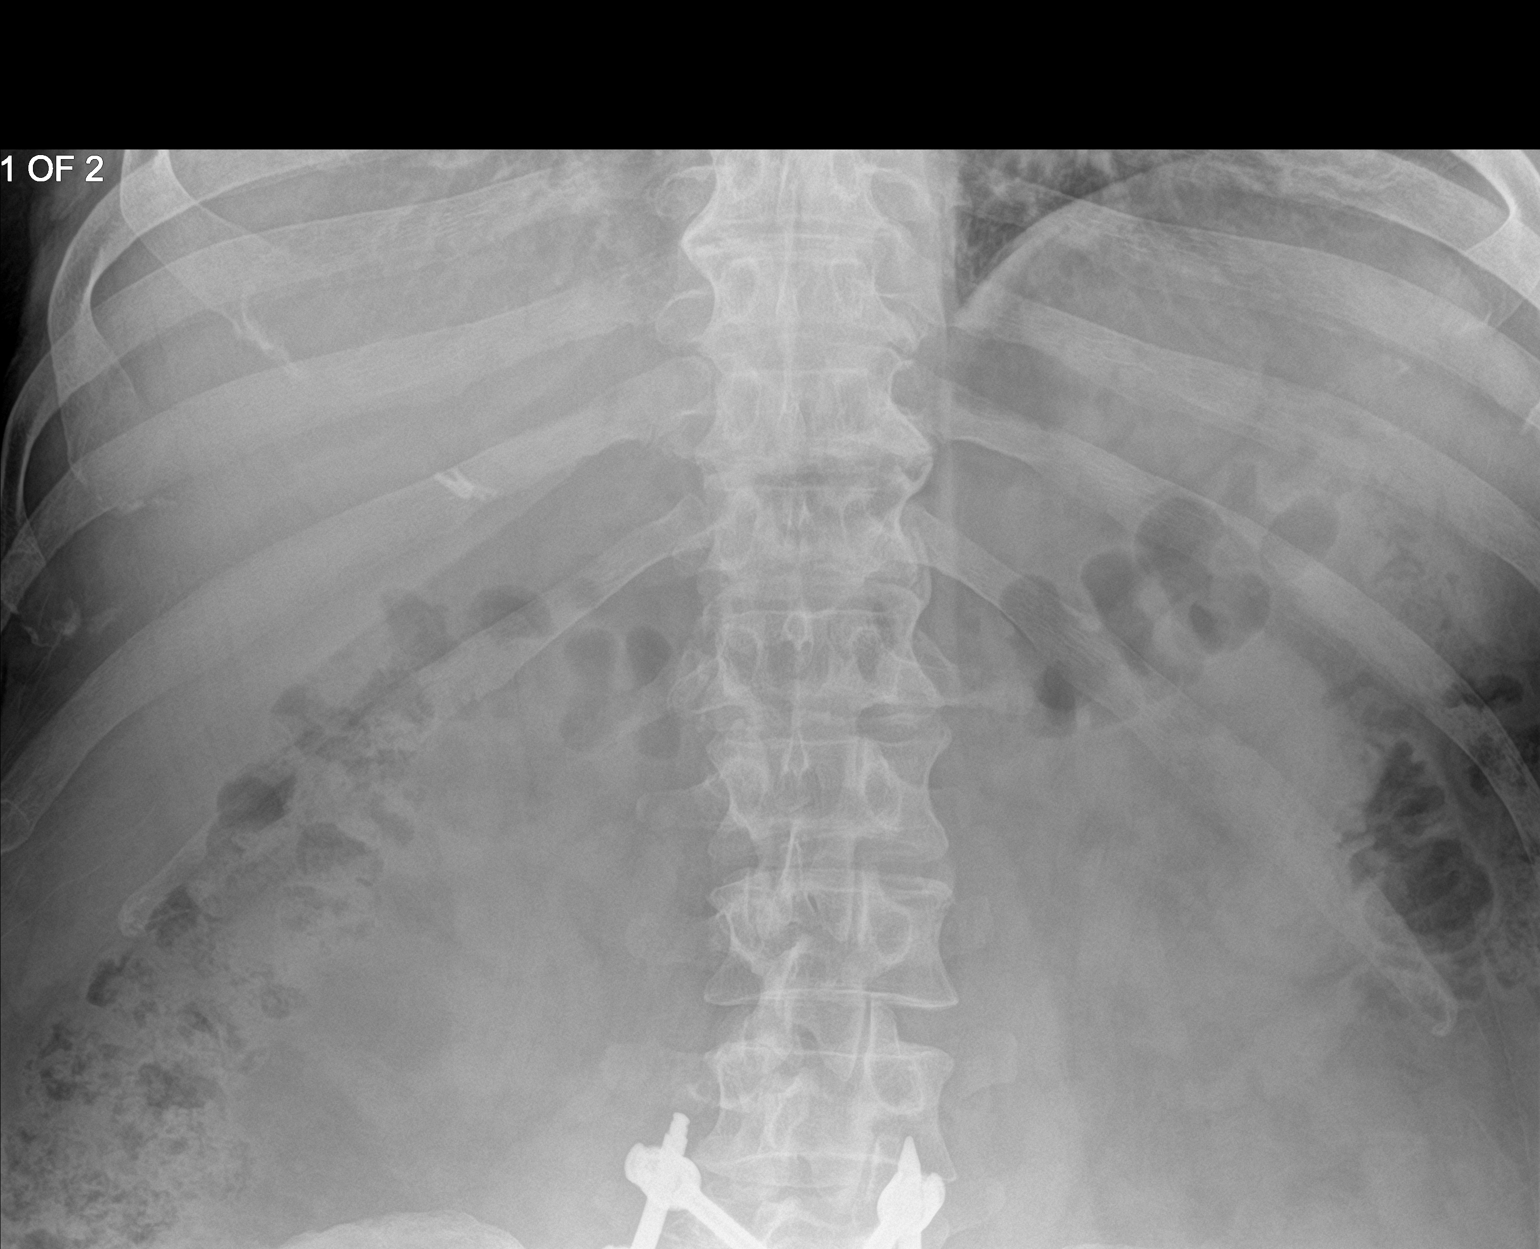

[2 of 2 positions shown; findings below may reference images not displayed]

FINDINGS: Bowel gas pattern is normal. Stone of the mid/distal right ureter is
not definitely visualized. Vague density overlying the lower pole
left renal shadow measuring 4 mm, likely previously seen
nonobstructing left renal stone. Additional calcifications are seen
in the pelvis which are likely phleboliths. Orthopedic hardware of
the lower lumbar spine.
IMPRESSION: Stone of the mid/distal right ureter is not definitely visualized,
possibly obscured by osseous shadows and bowel gas.

## 2021-07-09 SURGERY — LITHOTRIPSY, ESWL
Anesthesia: Moderate Sedation | Laterality: Right

## 2021-07-09 MED ORDER — CEFAZOLIN IN SODIUM CHLORIDE 3-0.9 GM/100ML-% IV SOLN
3.0000 g | Freq: Once | INTRAVENOUS | Status: AC
  Administered 2021-07-09: 3 g via INTRAVENOUS
  Filled 2021-07-09: qty 100

## 2021-07-09 MED ORDER — ONDANSETRON HCL 4 MG/2ML IJ SOLN
INTRAMUSCULAR | Status: AC
  Administered 2021-07-09: 4 mg via INTRAVENOUS
  Filled 2021-07-09: qty 2

## 2021-07-09 MED ORDER — DIPHENHYDRAMINE HCL 25 MG PO CAPS
ORAL_CAPSULE | ORAL | Status: AC
  Filled 2021-07-09: qty 1

## 2021-07-09 MED ORDER — CEPHALEXIN 500 MG PO CAPS
ORAL_CAPSULE | ORAL | Status: AC
  Filled 2021-07-09: qty 1

## 2021-07-09 NOTE — Discharge Instructions (Addendum)
As per the [facility] discharge instructions ?Continue Percocet and tamsulosin that were prescribed in the ED ?Based on renal function would avoid Toradol or ibuprofen ?[facility] at [phone] for fever greater than 101 degrees or pain not controlled with oral medication ?Postop follow-up appointment schedule 07/30/2021 ? ? ?AMBULATORY SURGERY  ?DISCHARGE INSTRUCTIONS ? ? ?The drugs that you were given will stay in your system until tomorrow so for the next 24 hours you should not: ? ?Drive an automobile ?Make any legal decisions ?Drink any alcoholic beverage ? ? ?You may resume regular meals tomorrow.  Today it is better to start with liquids and gradually work up to solid foods. ? ?You may eat anything you prefer, but it is better to start with liquids, then soup and crackers, and gradually work up to solid foods. ? ? ?Please notify your doctor immediately if you have any unusual bleeding, trouble breathing, redness and pain at the surgery site, drainage, fever, or pain not relieved by medication. ? ? ? ?Additional Instructions: ? ? ? ? ? ? ? ?Please contact your physician with any problems or Same Day Surgery at [phone], Monday through Friday 6 am to 4 pm, or [place] at [facility] number at [phone].  ?

## 2021-07-09 NOTE — Interval H&P Note (Signed)
History and Physical Interval Note: ? ?CV:RRR ?Lungs:clear ? ?07/09/2021 ?10:43 AM ? ?Manuel Cain  has presented today for surgery, with the diagnosis of Right Ureteral Stone.  The various methods of treatment have been discussed with the patient and family. After consideration of risks, benefits and other options for treatment, the patient has consented to  Procedure(s): ?EXTRACORPOREAL SHOCK WAVE LITHOTRIPSY (ESWL) (Right) as a surgical intervention.  The patient's history has been reviewed, patient examined, no change in status, stable for surgery.  I have reviewed the patient's chart and labs.  Questions were answered to the patient's satisfaction.   ? ? ?[name] [name] ? ? ?

## 2021-07-12 ENCOUNTER — Emergency Department
Admission: EM | Admit: 2021-07-12 | Discharge: 2021-07-12 | Disposition: A | Discharge status: Home / Self Care | Payer: 59 | Attending: Emergency Medicine | Admitting: Emergency Medicine

## 2021-07-12 ENCOUNTER — Emergency Department: Payer: 59

## 2021-07-12 ENCOUNTER — Other Ambulatory Visit: Payer: Self-pay

## 2021-07-12 DIAGNOSIS — I10 Essential (primary) hypertension: Secondary | ICD-10-CM | POA: Diagnosis not present

## 2021-07-12 DIAGNOSIS — R1013 Epigastric pain: Secondary | ICD-10-CM | POA: Diagnosis not present

## 2021-07-12 DIAGNOSIS — D72829 Elevated white blood cell count, unspecified: Secondary | ICD-10-CM | POA: Insufficient documentation

## 2021-07-12 DIAGNOSIS — R1031 Right lower quadrant pain: Secondary | ICD-10-CM | POA: Insufficient documentation

## 2021-07-12 DIAGNOSIS — K760 Fatty (change of) liver, not elsewhere classified: Secondary | ICD-10-CM | POA: Diagnosis not present

## 2021-07-12 DIAGNOSIS — R109 Unspecified abdominal pain: Secondary | ICD-10-CM | POA: Diagnosis not present

## 2021-07-12 DIAGNOSIS — N200 Calculus of kidney: Secondary | ICD-10-CM | POA: Diagnosis not present

## 2021-07-12 DIAGNOSIS — R197 Diarrhea, unspecified: Secondary | ICD-10-CM | POA: Diagnosis not present

## 2021-07-12 DIAGNOSIS — K5900 Constipation, unspecified: Secondary | ICD-10-CM | POA: Diagnosis not present

## 2021-07-12 DIAGNOSIS — N134 Hydroureter: Secondary | ICD-10-CM | POA: Diagnosis not present

## 2021-07-12 LAB — URINALYSIS, ROUTINE W REFLEX MICROSCOPIC
Bacteria, UA: NONE SEEN
Bilirubin Urine: NEGATIVE
Glucose, UA: NEGATIVE mg/dL
Ketones, ur: NEGATIVE mg/dL
Leukocytes,Ua: NEGATIVE
Nitrite: NEGATIVE
Protein, ur: NEGATIVE mg/dL
Specific Gravity, Urine: 1.013 (ref 1.005–1.030)
Squamous Epithelial / HPF: NONE SEEN (ref 0–5)
pH: 7 (ref 5.0–8.0)

## 2021-07-12 LAB — CBC
HCT: 43.1 % (ref 39.0–52.0)
Hemoglobin: 14.2 g/dL (ref 13.0–17.0)
MCH: 26.6 pg (ref 26.0–34.0)
MCHC: 32.9 g/dL (ref 30.0–36.0)
MCV: 80.9 fL (ref 80.0–100.0)
Platelets: 377 10*3/uL (ref 150–400)
RBC: 5.33 MIL/uL (ref 4.22–5.81)
RDW: 13.6 % (ref 11.5–15.5)
WBC: 13.7 10*3/uL — ABNORMAL HIGH (ref 4.0–10.5)
nRBC: 0 % (ref 0.0–0.2)

## 2021-07-12 LAB — COMPREHENSIVE METABOLIC PANEL
ALT: 26 U/L (ref 0–44)
AST: 23 U/L (ref 15–41)
Albumin: 4.4 g/dL (ref 3.5–5.0)
Alkaline Phosphatase: 58 U/L (ref 38–126)
Anion gap: 8 (ref 5–15)
BUN: 18 mg/dL (ref 6–20)
CO2: 27 mmol/L (ref 22–32)
Calcium: 9.9 mg/dL (ref 8.9–10.3)
Chloride: 102 mmol/L (ref 98–111)
Creatinine, Ser: 1.62 mg/dL — ABNORMAL HIGH (ref 0.61–1.24)
GFR, Estimated: 51 mL/min — ABNORMAL LOW (ref >60)
Glucose, Bld: 112 mg/dL — ABNORMAL HIGH (ref 70–99)
Potassium: 3.7 mmol/L (ref 3.5–5.1)
Sodium: 137 mmol/L (ref 135–145)
Total Bilirubin: 0.5 mg/dL (ref 0.3–1.2)
Total Protein: 7.9 g/dL (ref 6.5–8.1)

## 2021-07-12 LAB — LIPASE, BLOOD: Lipase: 111 U/L — ABNORMAL HIGH (ref 11–51)

## 2021-07-12 IMAGING — CR DG ABDOMEN 1V
1 series · 2 of 2 positions shown · non-contrast
Comparison: [DATE]

CLINICAL DATA: Abdominal pain, constipation, recent diagnosis of
kidney stones status post lithotripsy

EXAM:
ABDOMEN - 1 VIEW

[Series 1: dg abd 1 view · 0.14mm/px · 2 of 2 slices shown]
[im 1/2]
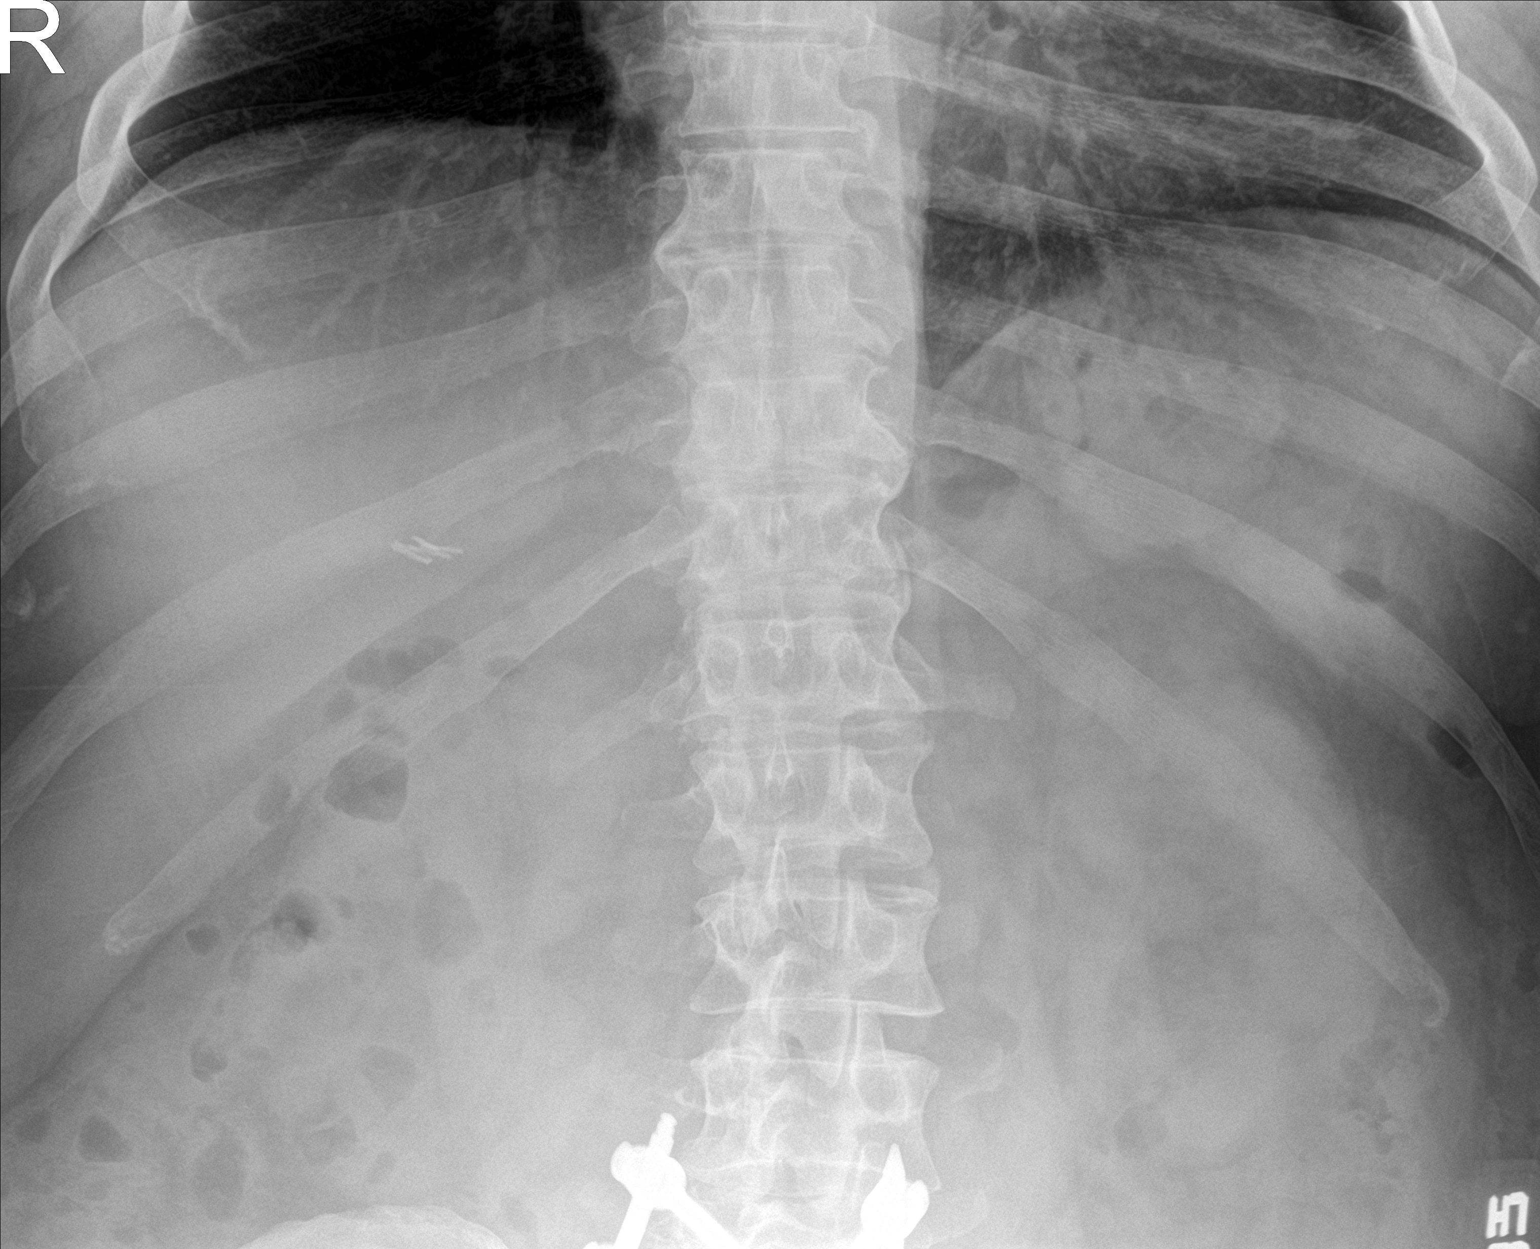
[im 2/2]
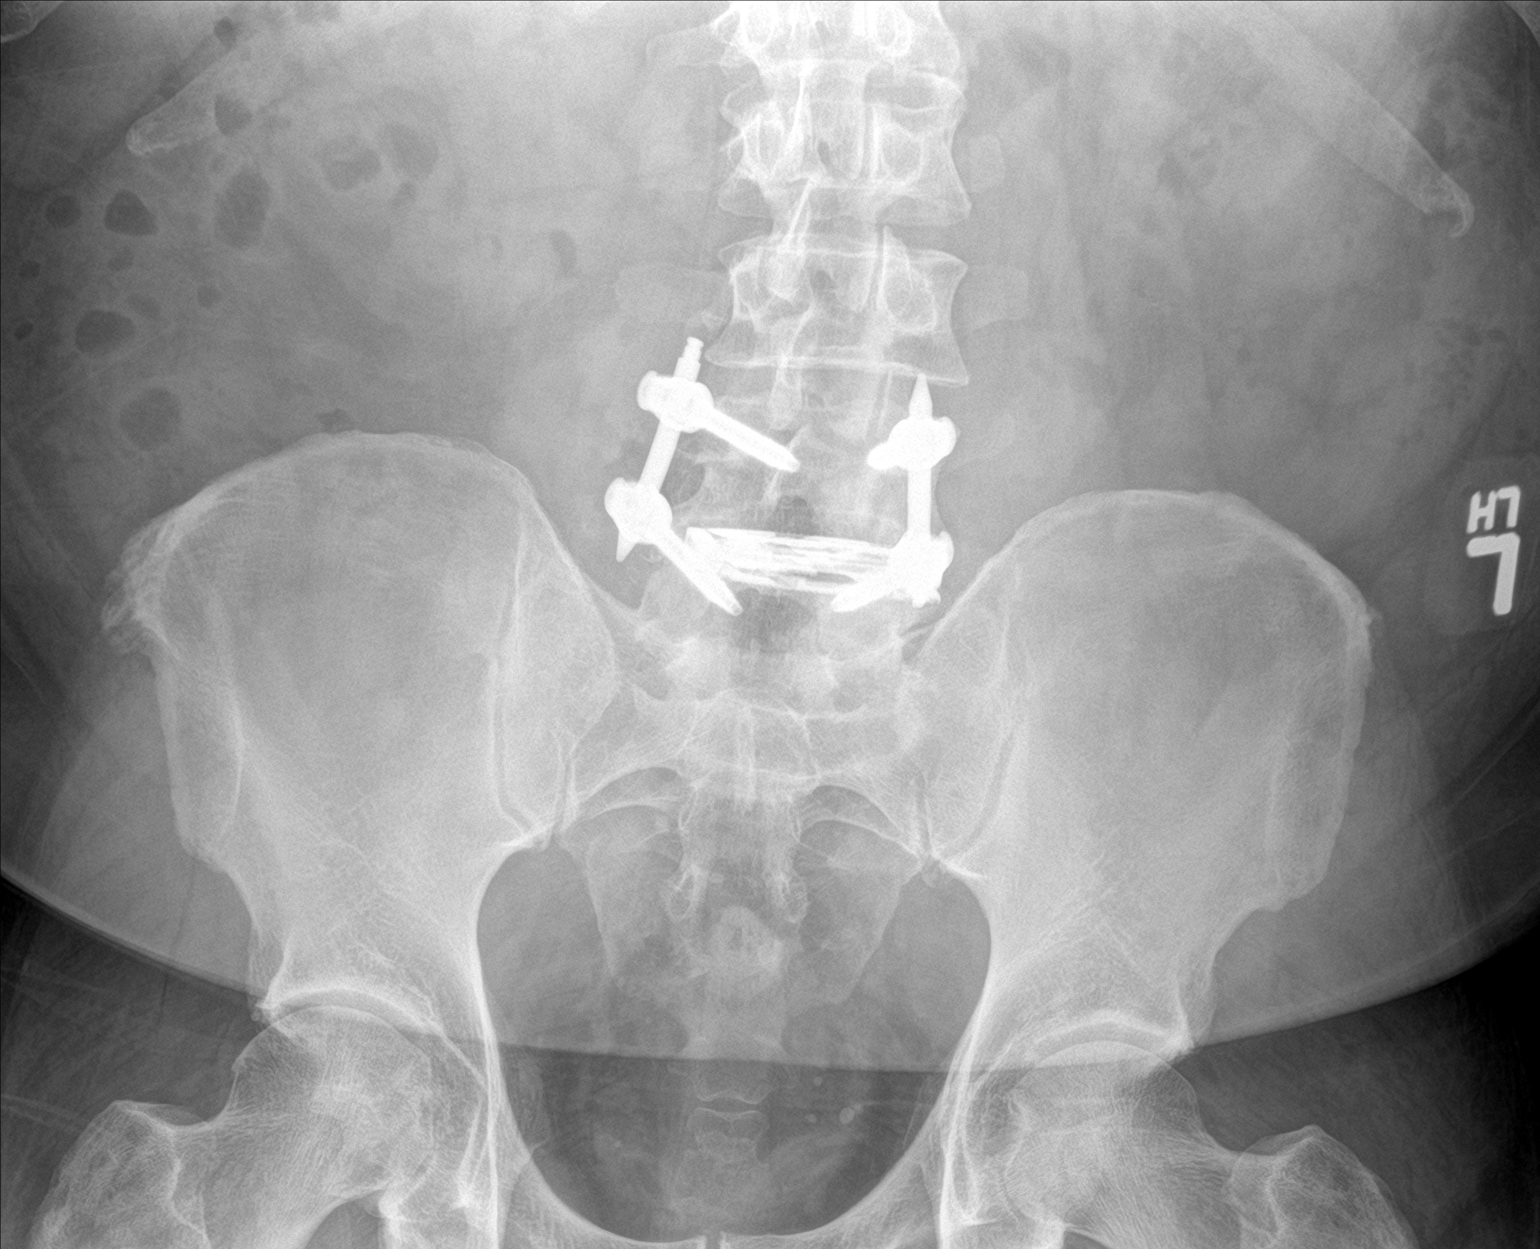

[2 of 2 positions shown; findings below may reference images not displayed]

FINDINGS: Nonobstructive pattern of bowel gas. No large burden of stool. No
persistent urinary tract calculi identified radiographically.
IMPRESSION: 1.  Nonobstructive pattern of bowel gas. No large burden of stool.

2.  No persistent urinary tract calculi identified radiographically.

## 2021-07-12 IMAGING — CT CT ABD-PELV W/ CM
2 of 5 series · 15 of 46 positions shown, 17 images · IV contrast (APPLIED)
Comparison: [DATE].

CLINICAL DATA: Abdominal pain, acute, nonlocalized

EXAM:
CT ABDOMEN AND PELVIS WITH CONTRAST
TECHNIQUE: Multidetector CT imaging of the abdomen and pelvis was performed
using the standard protocol following bolus administration of
intravenous contrast.

[Series 2: abdomen 5.0 · axial · 0.94mm/px · z∈[-880,-400]mm · 12 of 113 slices shown, 14 images]
[im 9/113  soft-tissue]
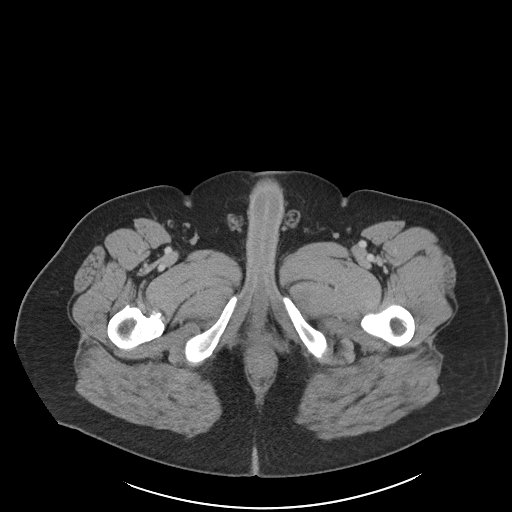
[im 9/113  bone]
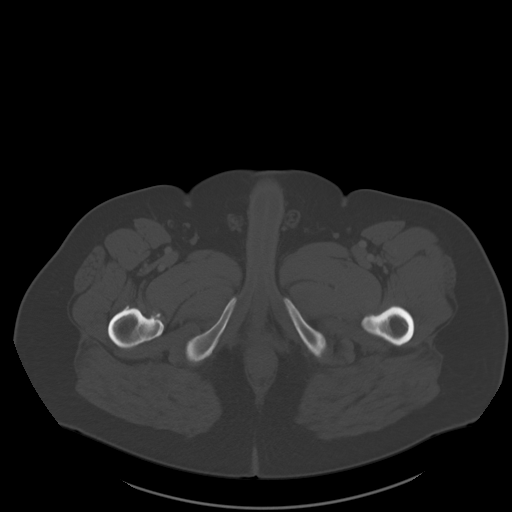
[im 17/113  soft-tissue]
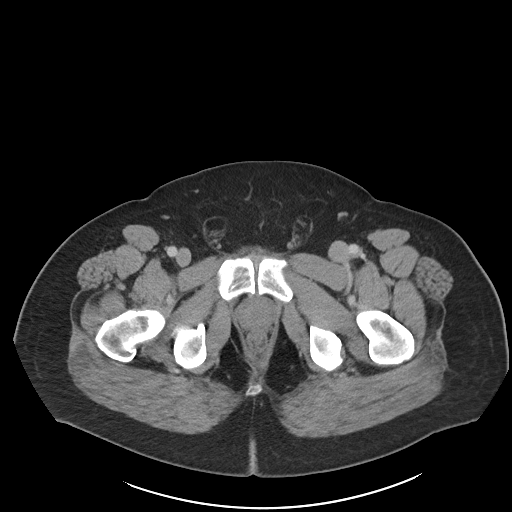
[im 25/113  soft-tissue]
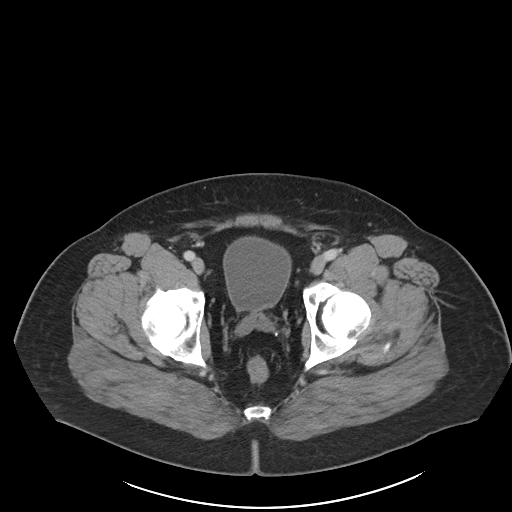
[im 33/113  soft-tissue]
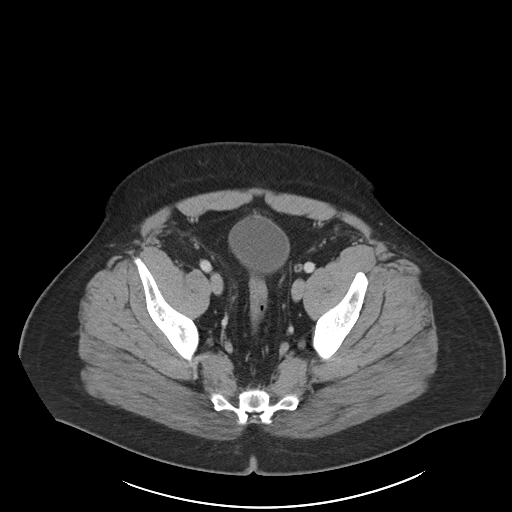
[im 41/113  soft-tissue]
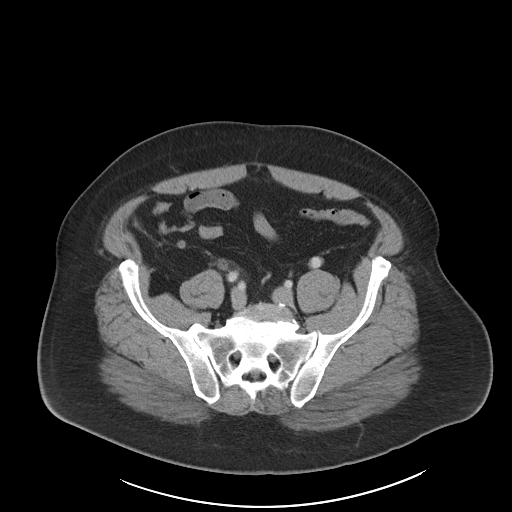
[im 49/113  soft-tissue]
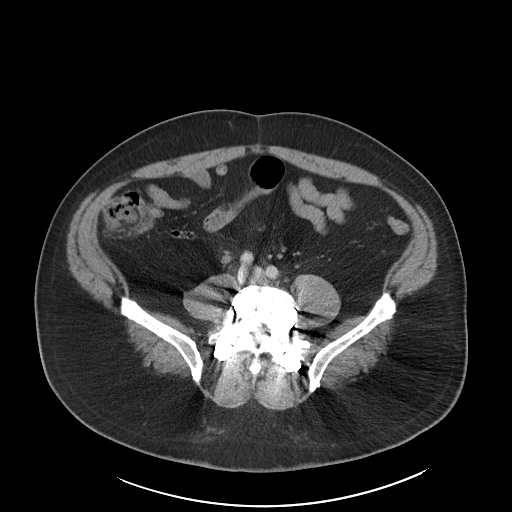
[im 65/113  soft-tissue]
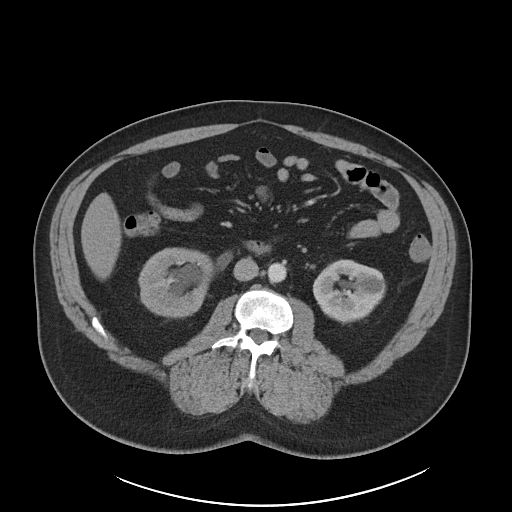
[im 73/113  soft-tissue]
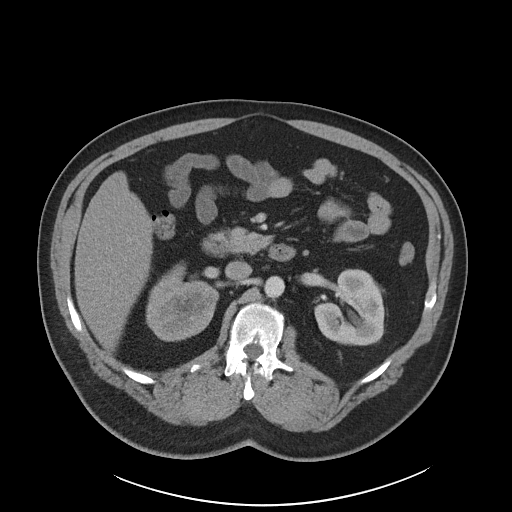
[im 81/113  soft-tissue]
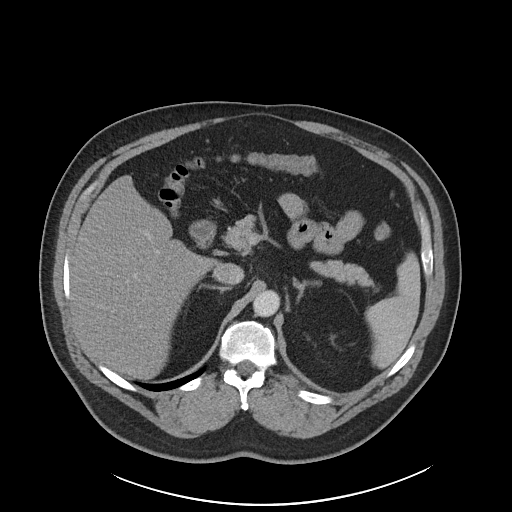
[im 81/113  bone]
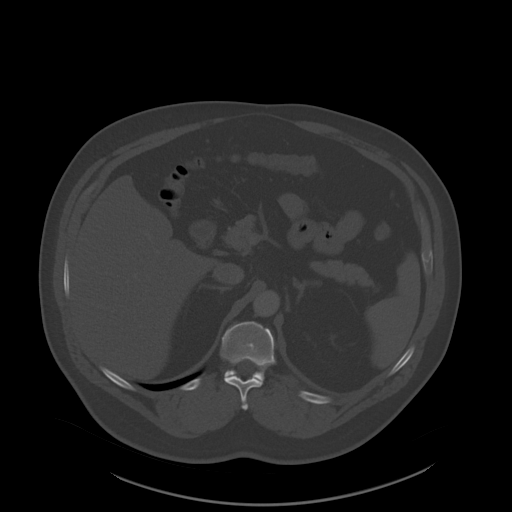
[im 89/113  soft-tissue]
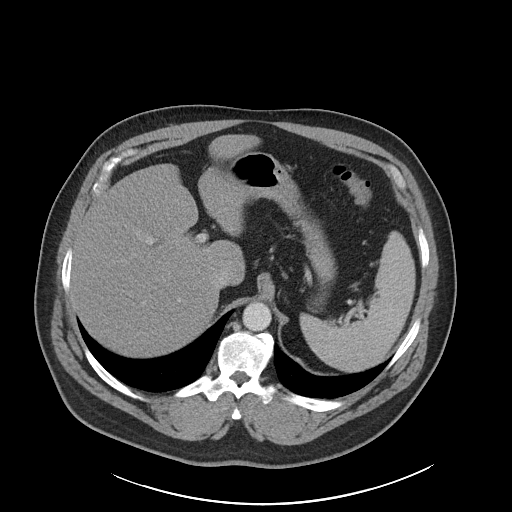
[im 97/113  soft-tissue]
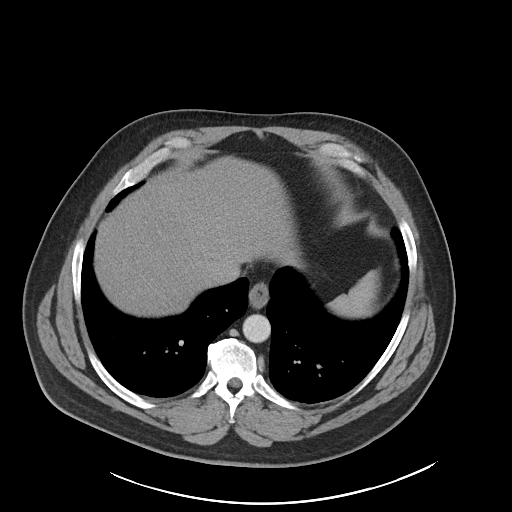
[im 105/113  soft-tissue]
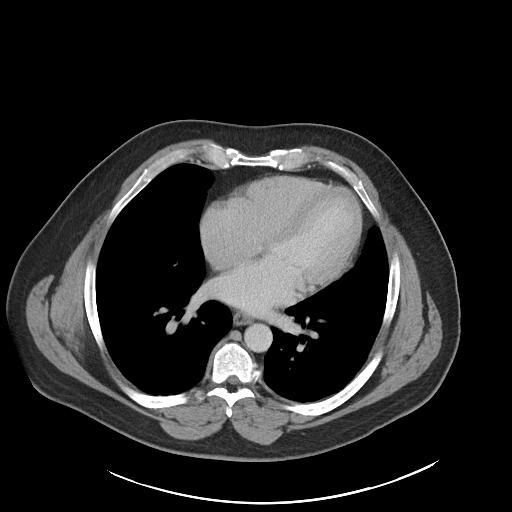

[Series 5: abdomen 3.0 mpr cor · coronal · 0.91mm/px · 3 of 119 slices shown]
[im 40/119  soft-tissue]
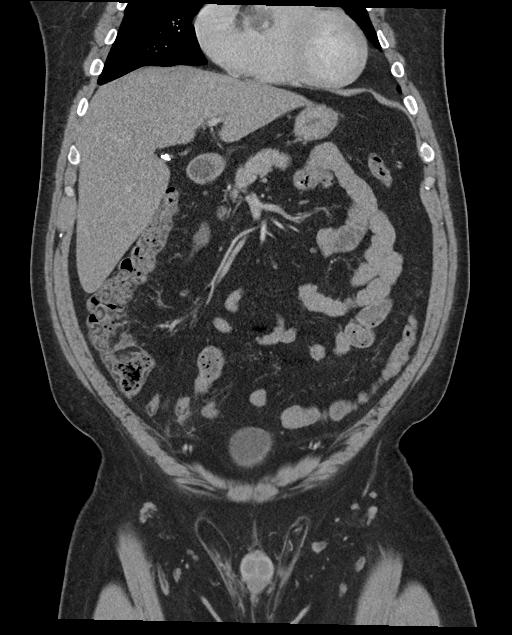
[im 53/119  soft-tissue]
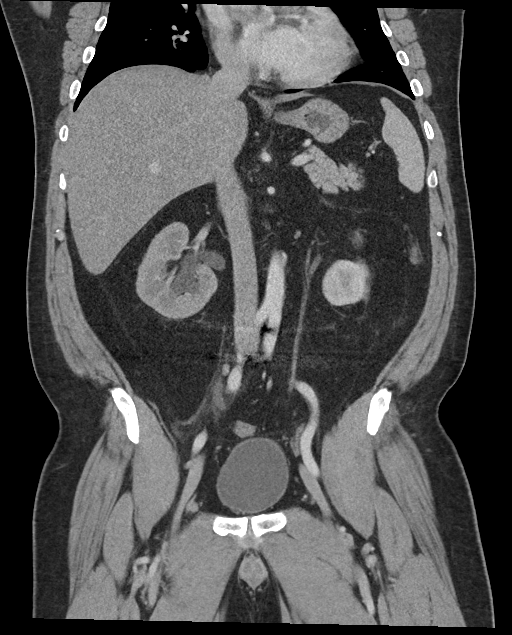
[im 66/119  soft-tissue]
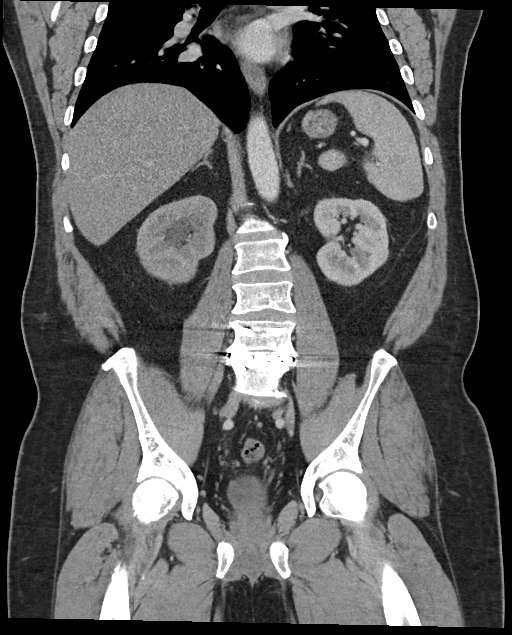

[15 of 46 positions shown; findings below may reference images not displayed]

RADIATION DOSE REDUCTION: This exam was performed according to the
departmental dose-optimization program which includes automated
exposure control, adjustment of the mA and/or kV according to
patient size and/or use of iterative reconstruction technique.

CONTRAST:  100mL OMNIPAQUE IOHEXOL 300 MG/ML  SOLN
FINDINGS: Lower chest: Bibasilar atelectasis.

Hepatobiliary: Hepatic steatosis. Status post cholecystectomy.
Widening of the fissure and porta hepatis with mildly lobulated
contours. No intrahepatic or extrahepatic biliary ductal dilation.

Pancreas: Unremarkable. No pancreatic ductal dilatation or
surrounding inflammatory changes.

Spleen: Upper limits of normal in size.

Adrenals/Urinary Tract: At least 2 adjacent ureterolithiasis in the
mid to distal RIGHT ureter which span 8 mm. There is an additional
punctate nephrolithiasis at the RIGHT UVJ. There is upstream
mild-to-moderate hydroureteronephrosis with mild adjacent fat
stranding. Extent of upstream hydronephrosis is similar comparison
to prior. There is delayed enhancement of the RIGHT kidney
consistent with obstructive physiology. Punctate nonobstructive
LEFT-sided nephrolithiasis. No LEFT-sided hydronephrosis. Bladder is
unremarkable. Adrenal glands are normal.

Stomach/Bowel: Stomach is within normal limits. Appendix is
unchanged in appearance. No evidence of bowel wall thickening,
distention, or inflammatory changes.

Vascular/Lymphatic: Aorta is normal in course and caliber. Mildly
prominent lymph node adjacent to the course of the RIGHT ureter is
likely reactive (series 5, image 53).

Reproductive: Prostate is unremarkable.

Other: No free air.

Musculoskeletal: Status post posterior fixation of L4-5 with
intervertebral spacer placement.
IMPRESSION: 1. Revisualization of obstructing ureterolithiasis within the distal
RIGHT ureter spanning 8 mm. There is an additional punctate
nephrolithiasis at the RIGHT UVJ. There is a delayed renal
nephrogram consistent with obstructive physiology with
similar-appearing mild to moderate upstream hydronephrosis. There is
adjacent fat stranding. Recommend correlation with urinalysis to
evaluate for underlying infection.
2. Hepatic steatosis. There is some morphologic changes which may
reflect early cirrhosis.

## 2021-07-12 MED ORDER — IOHEXOL 300 MG/ML  SOLN
100.0000 mL | Freq: Once | INTRAMUSCULAR | Status: AC | PRN
  Administered 2021-07-12: 100 mL via INTRAVENOUS
  Filled 2021-07-12: qty 100

## 2021-07-12 MED ORDER — SODIUM CHLORIDE 0.9 % IV BOLUS
1000.0000 mL | Freq: Once | INTRAVENOUS | Status: AC
Start: 2021-07-12 — End: 2021-07-12
  Administered 2021-07-12: 1000 mL via INTRAVENOUS

## 2021-07-12 MED ORDER — MORPHINE SULFATE (PF) 4 MG/ML IV SOLN
4.0000 mg | Freq: Once | INTRAVENOUS | Status: AC
  Administered 2021-07-12: 4 mg via INTRAVENOUS
  Filled 2021-07-12: qty 1

## 2021-07-12 MED ORDER — ONDANSETRON HCL 4 MG/2ML IJ SOLN
4.0000 mg | Freq: Once | INTRAMUSCULAR | Status: AC
Start: 2021-07-12 — End: 2021-07-12
  Administered 2021-07-12: 4 mg via INTRAVENOUS
  Filled 2021-07-12: qty 2

## 2021-07-12 NOTE — ED Triage Notes (Signed)
Pt states he was diagnosed with kidney stones on Tuesday and had lithotripsy on Thursday- pt states he was given percocet for pain- since then pt has had increased abd pain, bloating, and constipation- pt states he had a solid BM yesterday around noon but since then has had small 2 occurences of diarrhea- pt feels like he still needs to have a BM- pt states this pain is different than the kidney stone pain and is located in the middle of his abdomen- pt does have nausea, but no vomiting ?

## 2021-07-12 NOTE — Discharge Instructions (Addendum)
Your CAT scan redemonstrated kidney stones but does not show anything else acute today.  Follow-up with urology.  Return to the ED if you develop fevers inability to tolerate liquids.   ?

## 2021-07-12 NOTE — ED Provider Notes (Signed)
? ?[facility] ?Provider Note ? ? ? Event Date/Time  ? First MD Initiated Contact with Patient 07/12/21 1239   ?  (approximate) ? ? ?History  ? ?Abdominal Pain ? ?HPI ? ?Manuel Cain is a 51 y.o. male with past medical history of kidney stones who presents with abdominal pain.  Patient was seen several days ago with right flank pain and epigastric discomfort was diagnosed with a 8 mm kidney stone and underwent lithotripsy 3 days ago.  Notes that the flank pain is improved but over the last day and a half his mid epigastric pain is worsened and he feels quite bloated and banded.  He is having bowel movements.  Normal bowel movement yesterday and then diarrhea today.  No nausea vomiting denies fevers or chills.  Initially was taking Percocet for pain but this did not help his symptoms now he is just taking Tylenol. ?  ? ?Past Medical History:  ?Diagnosis Date  ? Anxiety   ? Blood in stool   ? dark pebble like per pt  x4 wks  ? Chest pain   ? on going per pt-x 47mo ? Chronic ankle pain   ? Right, s/p [id number]  ? Chronic hip pain   ? right, s/p [id number]  ? Curvature of spine   ? Dyspnea   ? Esophagitis   ? Frequent nosebleeds   ? x 2 mo per pt (march 2018)  ? GERD (gastroesophageal reflux disease)   ? Head injury 04/2016  ?    ? Hemorrhoids   ? Hypertension   ? Kidney stone   ? Memory changes   ? x 1 yr-since 2017  ? Migraine   ? since 2013  ? Vasovagal syncope   ? ? ?Patient Active Problem List  ? Diagnosis Date Noted  ? History of lumbar fusion 12/25/2019  ? Chronic nonintractable headache 07/15/2018  ? Acute upper respiratory infection 05/25/2018  ? Cough 05/25/2018  ? B12 deficiency 11/04/2017  ? Idiopathic peripheral neuropathy 11/03/2017  ? Sexual dysfunction 04/18/2017  ? Situational anxiety 04/18/2017  ? Hematochezia   ? Benign neoplasm of transverse colon   ? Rectal polyp   ? Gastroesophageal reflux disease   ? Vaccine counseling 06/10/2016  ? Essential hypertension 10/25/2015  ?  Morbid obesity with BMI of 40.0-44.9, adult [place] 10/25/2015  ? Leukocytosis 10/25/2015  ? Chest pain, unspecified 10/24/2015  ? ? ? ?Physical Exam  ?Triage Vital Signs: ?ED Triage Vitals  ?Enc Vitals Group  ?   BP 07/12/21 1203 (!) 144/82  ?   Pulse Rate 07/12/21 1203 (!) 59  ?   Resp 07/12/21 1203 20  ?   Temp 07/12/21 1203 98 ?F (36.7 ?C)  ?   Temp Source 07/12/21 1203 Oral  ?   SpO2 07/12/21 1203 96 %  ?   Weight 07/12/21 1200 275 lb (124.7 kg)  ?   Height 07/12/21 1200 '5\' 9"'$  (1.753 m)  ?   Head Circumference --   ?   Peak Flow --   ?   Pain Score 07/12/21 1200 6  ?   Pain Loc --   ?   Pain Edu? --   ?   Excl. in [place] --   ? ? ?Most recent vital signs: ?Vitals:  ? 07/12/21 1203  ?BP: (!) 144/82  ?Pulse: (!) 59  ?Resp: 20  ?Temp: 98 ?F (36.7 ?C)  ?SpO2: 96%  ? ? ? ?General: Awake, no distress.  ?CV:  Good peripheral perfusion.  ?Resp:  Normal effort.  ?Abd:  No distention.  No tenderness to palpation of the abdomen throughout worse in the epigastric region and right lower quadrant ?Neuro:             Awake, Alert, Oriented x 3  ?Other:   ? ? ?ED Results / Procedures / Treatments  ?Labs ?(all labs ordered are listed, but only abnormal results are displayed) ?Labs Reviewed  ?LIPASE, BLOOD - Abnormal; Notable for the following components:  ?    Result Value  ? Lipase 111 (*)   ? All other components within normal limits  ?COMPREHENSIVE METABOLIC PANEL - Abnormal; Notable for the following components:  ? Glucose, Bld 112 (*)   ? Creatinine, Ser 1.62 (*)   ? GFR, Estimated 51 (*)   ? All other components within normal limits  ?CBC - Abnormal; Notable for the following components:  ? WBC 13.7 (*)   ? All other components within normal limits  ?URINALYSIS, ROUTINE W REFLEX MICROSCOPIC - Abnormal; Notable for the following components:  ? Color, Urine YELLOW (*)   ? APPearance CLEAR (*)   ? Hgb urine dipstick MODERATE (*)   ? All other components within normal limits  ? ? ? ?EKG ? ? ? ? ?RADIOLOGY ?I reviewed the CT  abdomen pelvis which shows kidney stone 8 mm on the right but no other acute abnormality ? ? ?PROCEDURES: ? ?Critical Care performed: No ? ?Procedures ? ? ? ?MEDICATIONS ORDERED IN ED: ?Medications  ?sodium chloride 0.9 % bolus 1,000 mL (1,000 mLs Intravenous New Bag/Given 07/12/21 1330)  ?morphine (PF) 4 MG/ML injection 4 mg (4 mg Intravenous Given 07/12/21 1329)  ?ondansetron (ZOFRAN) injection 4 mg (4 mg Intravenous Given 07/12/21 1333)  ?iohexol (OMNIPAQUE) 300 MG/ML solution 100 mL (100 mLs Intravenous Contrast Given 07/12/21 1436)  ? ? ? ?IMPRESSION / MDM / ASSESSMENT AND PLAN / ED COURSE  ?I reviewed the triage vital signs and the nursing notes. ?             ?               ? ?Differential diagnosis includes, but is not limited to, post lithotripsy pain, pancreatitis, biliary colic, bowel obstruction, enteritis/colitis, appendicitis ? ?The patient is a 51 year old male who presents with epigastric abdominal pain.  He is status post recent lithotripsy and I actually saw this patient in the ED on the ninth 5 days ago when he had right flank pain but also some epigastric pain at that time was diagnosed with an 8 mm kidney stone and underwent lithotripsy 2 days later.  His flank pain is improved but his midepigastric pain is worse he has had some diarrhea.  His vitals are within normal limits and overall he looks well but he is tender in the abdomen throughout worse in the midepigastric region and right lower quadrant.  Labs show leukocytosis of 13 creatinine is 1.6 which is improved from 1.8 days ago.  His lipase is 111 which is not 3 times upper limit of normal.  Is possible that this is all related to his stone lithotripsy but mid epigastric pain is somewhat atypical for kidney stone and I do think we should rule out other pathology today so we will obtain a CT abdomen pelvis with contrast this time and give fluid bolus and morphine. ? ?  ?CT redemonstrates 42m obstructing stone but no other acute pathology.  Suspect his symptoms are related to stone.  Recommended clear liquid diet, as patient notes that food makes it worse. He is already on PPI.  ? ?FINAL CLINICAL IMPRESSION(S) / ED DIAGNOSES  ? ?Final diagnoses:  ?Epigastric pain  ? ? ? ?Rx / DC Orders  ? ?ED Discharge Orders   ? ? None  ? ?  ? ? ? ?Note:  This document was prepared using Dragon voice recognition software and may include unintentional dictation errors. ?  ?Rada Hay, MD ?07/12/21 1529 ? ?

## 2021-07-16 DIAGNOSIS — R109 Unspecified abdominal pain: Secondary | ICD-10-CM | POA: Diagnosis not present

## 2021-07-16 DIAGNOSIS — R55 Syncope and collapse: Secondary | ICD-10-CM | POA: Diagnosis not present

## 2021-07-30 ENCOUNTER — Encounter: Payer: Self-pay | Admitting: Physician Assistant

## 2021-07-30 ENCOUNTER — Ambulatory Visit
Admission: RE | Admit: 2021-07-30 | Discharge: 2021-07-30 | Disposition: A | Discharge status: Home / Self Care | Payer: 59 | Source: Ambulatory Visit | Attending: Urology | Admitting: Urology

## 2021-07-30 ENCOUNTER — Other Ambulatory Visit: Payer: Self-pay

## 2021-07-30 ENCOUNTER — Ambulatory Visit: Payer: 59 | Admitting: Physician Assistant

## 2021-07-30 ENCOUNTER — Ambulatory Visit
Admission: RE | Admit: 2021-07-30 | Discharge: 2021-07-30 | Disposition: A | Discharge status: Home / Self Care | Payer: 59 | Attending: Urology | Admitting: Urology

## 2021-07-30 VITALS — BP 133/80 | HR 71 | Ht 69.0 in | Wt 268.0 lb

## 2021-07-30 DIAGNOSIS — N201 Calculus of ureter: Secondary | ICD-10-CM

## 2021-07-30 DIAGNOSIS — N200 Calculus of kidney: Secondary | ICD-10-CM

## 2021-07-30 DIAGNOSIS — Z87442 Personal history of urinary calculi: Secondary | ICD-10-CM | POA: Diagnosis not present

## 2021-07-30 DIAGNOSIS — I878 Other specified disorders of veins: Secondary | ICD-10-CM | POA: Diagnosis not present

## 2021-07-30 IMAGING — CR DG ABDOMEN 1V
2 series · 2 of 2 positions shown · non-contrast
Comparison: [DATE]

CLINICAL DATA: Status post ESWL of a right ureteral stone.

EXAM:
ABDOMEN - 1 VIEW

[abdomen kub (1 of 2)]
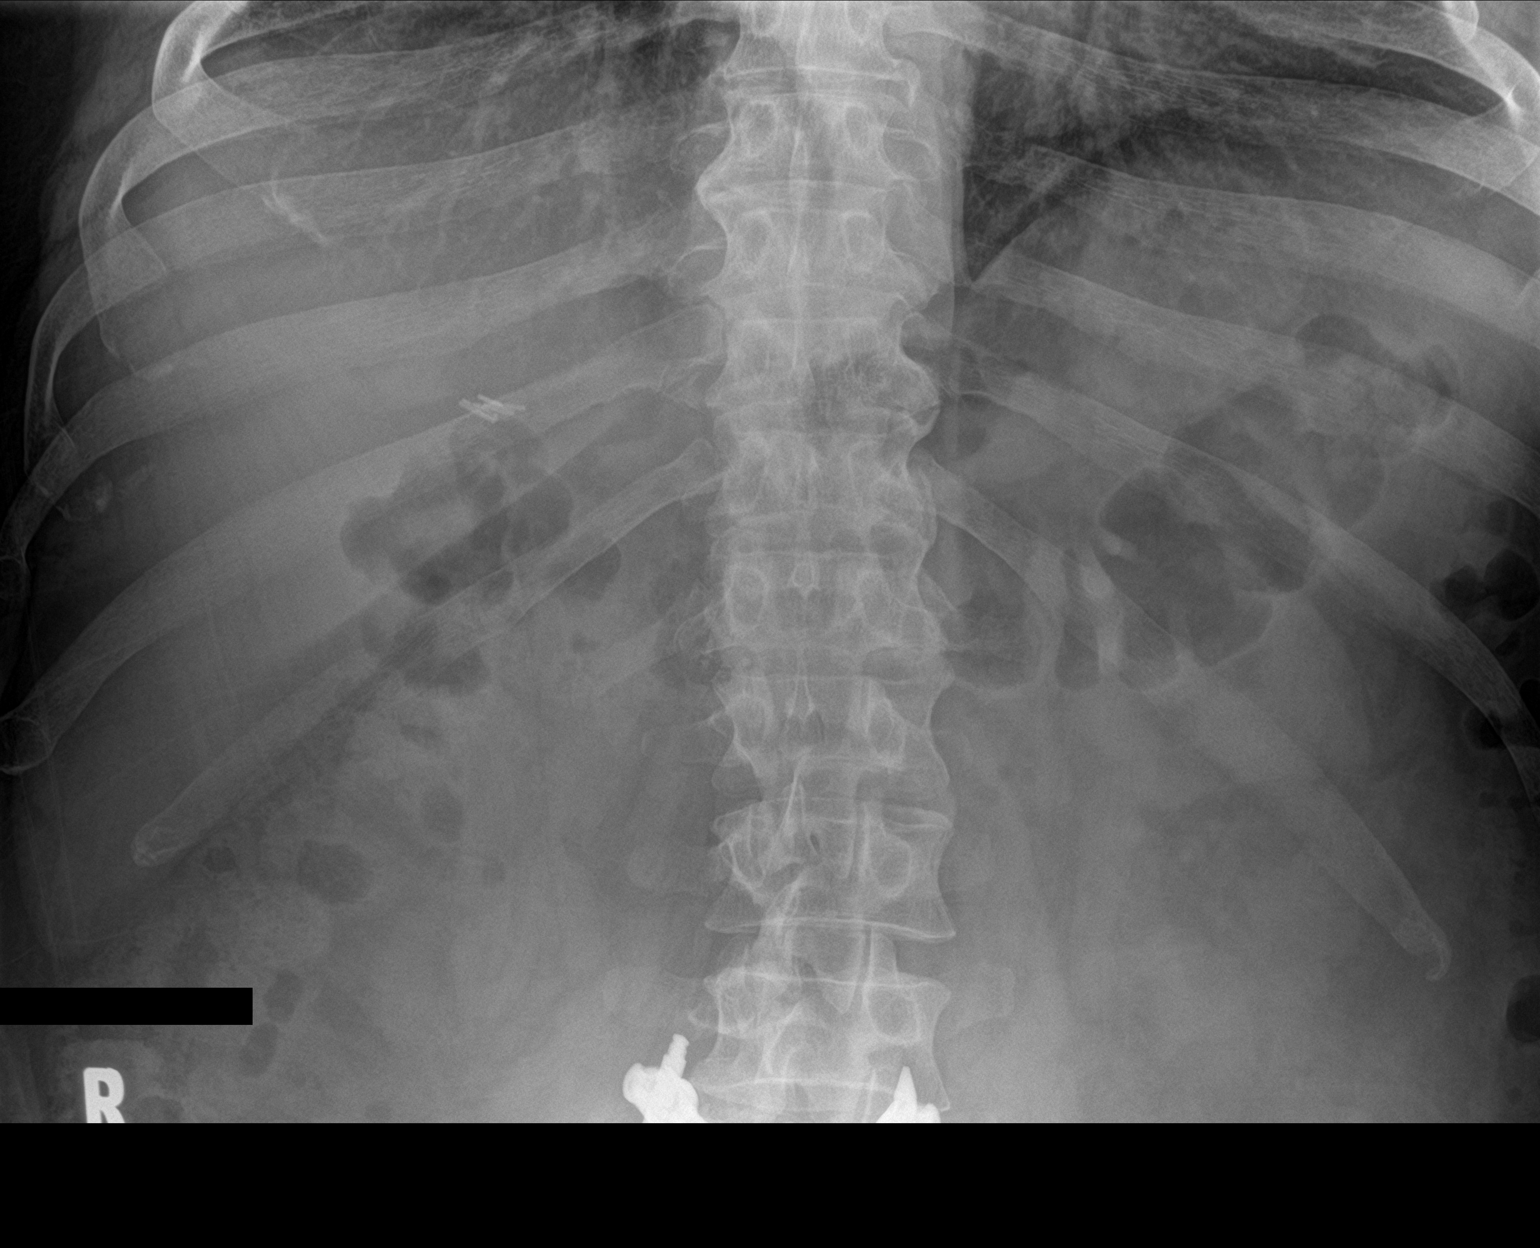

[abdomen kub (2 of 2)]
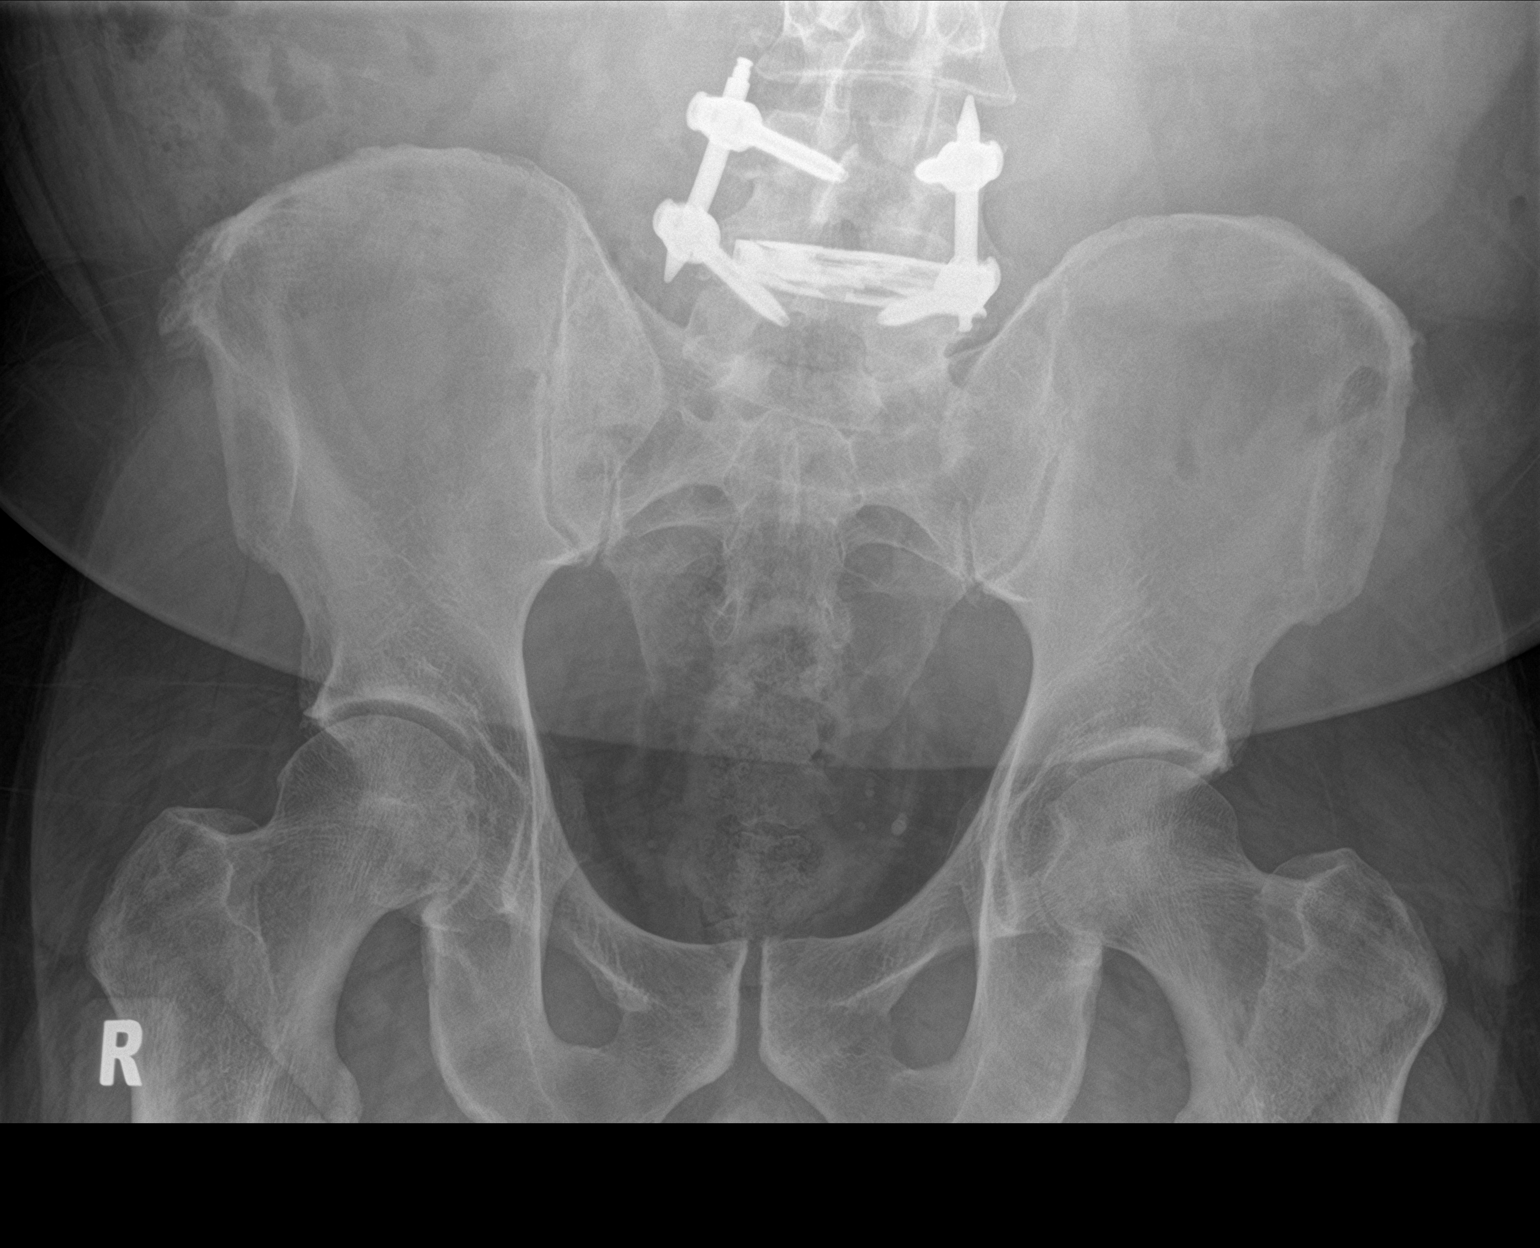

[2 of 2 positions shown; findings below may reference images not displayed]

FINDINGS: The bowel gas pattern is normal. No radio-opaque calculi or other
significant radiographic abnormality are seen. Small phleboliths are
seen within the lower pelvis, bilaterally. Radiopaque surgical clips
are seen overlying the right upper quadrant. Stable postoperative
changes are noted within the lower lumbar spine.
IMPRESSION: No evidence of renal calculi.

## 2021-07-30 NOTE — Progress Notes (Signed)
07/30/2021 10:56 AM   Manuel Cain 1970-07-04 [id number]  CC: Chief Complaint  Patient presents with   Follow-up   Nephrolithiasis   HPI: Manuel Cain is a 51 y.o. male with PMH nephrolithiasis who underwent ESWL with Dr. Bernardo Heater on 07/09/2021 for management of an 8 mm distal right ureteral stone who presents today for postop follow-up.  Operative note describes smudging of the stone.   He was seen in the emergency department 3 days after ESWL with reports of increasing abdominal pain, bloating, and constipation.  He underwent KUB and CTAP with contrast, which revealed a stable 8 mm distal right ureteral stone with punctate right UVJ stone.  UA was notable only for 6-10 RBCs/hpf at that time.  Today he reports he passed multiple stone fragments, but he left them at home today.  He has been having some persistent left flank pain since he originally developed symptoms of his right ureteral stone and is concerned that he may be passing a left-sided stone as well.  KUB today with apparent interval resolution of the distal right ureteral stone.  4 mm left lower pole right renal stone appears stable.  This stone was not present on CT stone study dated 06/14/2020.  In-office UA and microscopy today pan negative.   PMH: Past Medical History:  Diagnosis Date   Anxiety    Blood in stool    dark pebble like per pt  x4 wks   Chest pain    on going per pt-x 44mo  Chronic ankle pain    Right, s/p MTWS(5681   Chronic hip pain    right, s/p MEXN(1700   Curvature of spine    Dyspnea    Esophagitis    Frequent nosebleeds    x 2 mo per pt (march 2018)   GERD (gastroesophageal reflux disease)    Head injury 04/2016       Hemorrhoids    Hypertension    Kidney stone    Memory changes    x 1 yr-since 2017   Migraine    since 2013   Vasovagal syncope     Surgical History: Past Surgical History:  Procedure Laterality Date   CHOLECYSTECTOMY N/A 08/23/2018   Procedure:  LAPAROSCOPIC CHOLECYSTECTOMY;  Surgeon: CHerbert Pun MD;  Location: [facility];  Service: General;  Laterality: N/A;   COLONOSCOPY WITH PROPOFOL N/A 12/16/2016   Procedure: COLONOSCOPY WITH PROPOFOL;  Surgeon: WLucilla Lame MD;  [place]  Service: Endoscopy;  Laterality: N/A;   ESOPHAGOGASTRODUODENOSCOPY (EGD) WITH PROPOFOL N/A 12/16/2016   Procedure: ESOPHAGOGASTRODUODENOSCOPY (EGD) WITH PROPOFOL;  Surgeon: WLucilla Lame MD;  [place]  Service: Endoscopy;  Laterality: N/A;   ESOPHAGOGASTRODUODENOSCOPY (EGD) WITH PROPOFOL N/A 08/09/2018   Procedure: ESOPHAGOGASTRODUODENOSCOPY (EGD) WITH PROPOFOL;  Surgeon: Toledo, TBenay Pike MD;  Location: [facility];  Service: Gastroenterology;  Laterality: N/A;   EXTRACORPOREAL SHOCK WAVE LITHOTRIPSY Right 07/09/2021   Procedure: EXTRACORPOREAL SHOCK WAVE LITHOTRIPSY (ESWL);  Surgeon: SAbbie Sons MD;  Location: [facility];  Service: Urology;  Laterality: Right;   KNEE SURGERY Left    POLYPECTOMY  12/16/2016   Procedure: POLYPECTOMY;  Surgeon: WLucilla Lame MD;  [place]  Service: Endoscopy;;   vastectomy  1999    Home Medications:  Allergies as of 07/30/2021       Reactions   Valium [diazepam] Other (See Comments)   convulsions        Medication List  Accurate as of July 30, 2021 10:56 AM. If you have any questions, ask your nurse or doctor.          CENTRUM SILVER 50+MEN PO Take 1 tablet by mouth daily.   lisinopril-hydrochlorothiazide 10-12.5 MG tablet Commonly known as: ZESTORETIC Take 1 tablet by mouth daily.   ondansetron 4 MG tablet Commonly known as: Zofran Take 1 tablet (4 mg total) by mouth daily as needed for nausea or vomiting.   pantoprazole 20 MG tablet Commonly known as: PROTONIX Take 40 mg by mouth 2 (two) times daily before a meal.   tamsulosin 0.4 MG Caps capsule Commonly known as: FLOMAX Take 1 capsule (0.4 mg total) by mouth  daily.   vitamin B-12 500 MCG tablet Commonly known as: CYANOCOBALAMIN Take 1,500 mcg by mouth daily.        Allergies:  Allergies  Allergen Reactions   Valium [Diazepam] Other (See Comments)    convulsions    Family History: Family History  Problem Relation Age of Onset   Hypertension Mother    Breast cancer Mother    CAD Father    Heart attack Father    Bladder Cancer Brother     Social History:   reports that he has never smoked. He has never been exposed to tobacco smoke. He has never used smokeless tobacco. He reports that he does not drink alcohol and does not use drugs.  Physical Exam: BP 133/80   Pulse 71   Ht '5\' 9"'$  (1.753 m)   Wt 268 lb (121.6 kg)   BMI 39.58 kg/m   Constitutional:  Alert and oriented, no acute distress, nontoxic appearing HEENT: [place], AT Cardiovascular: No clubbing, cyanosis, or edema Respiratory: Normal respiratory effort, no increased work of breathing Skin: No rashes, bruises or suspicious lesions Neurologic: Grossly intact, no focal deficits, moving all 4 extremities Psychiatric: Normal mood and affect  Laboratory Data: Results for orders placed or performed in visit on 07/30/21  Microscopic Examination   Urine  Result Value Ref Range   WBC, UA 0-5 0 - 5 /hpf   RBC 0-2 0 - 2 /hpf   Epithelial Cells (non renal) 0-10 0 - 10 /hpf   Bacteria, UA None seen None seen/Few  Urinalysis, Complete  Result Value Ref Range   Specific Gravity, UA 1.025 1.005 - 1.030   pH, UA 6.0 5.0 - 7.5   Color, UA Yellow Yellow   Appearance Ur Clear Clear   Leukocytes,UA Negative Negative   Protein,UA Negative Negative/Trace   Glucose, UA Negative Negative   Ketones, UA Negative Negative   RBC, UA Negative Negative   Bilirubin, UA Negative Negative   Urobilinogen, Ur 0.2 0.2 - 1.0 mg/dL   Nitrite, UA Negative Negative   Microscopic Examination See below:    Pertinent Imaging: KUB, 07/30/2021: CLINICAL DATA:  Status post ESWL of a right ureteral  stone.   EXAM: ABDOMEN - 1 VIEW   COMPARISON:  Jul 12, 2021   FINDINGS: The bowel gas pattern is normal. No radio-opaque calculi or other significant radiographic abnormality are seen. Small phleboliths are seen within the lower pelvis, bilaterally. Radiopaque surgical clips are seen overlying the right upper quadrant. Stable postoperative changes are noted within the lower lumbar spine.   IMPRESSION: No evidence of renal calculi.     Electronically Signed   By: Virgina Norfolk M.D.   On: 07/31/2021 20:52  I personally reviewed the images referenced above and note interval resolution of the distal right ureteral stone  and stable nonobstructing left lower pole stone.  Assessment & Plan:   1. Right ureteral stone Asymptomatic, apparent stone clearance on KUB today, and UA is benign.  I asked him to bring his stone fragments to clinic at his earliest convenience and we will reach out to him via MyChart with stone prevention recommendations per his results.  He expressed understanding. - Urinalysis, Complete - Calculi, with Photograph (to Clinical Lab); Future  2. Left renal stone We discussed that a small, nonobstructing left renal stone is not likely to be the source of his left flank pain and this is most likely musculoskeletal pain.  However, given that this is a new stone that has appeared within the last year, I offered him a metabolic work-up.  He wishes to defer this for now, which is reasonable.    We discussed the treatment options for his left renal stone include surveillance versus ESWL versus URS.  He would like to avoid another shockwave lithotripsy for now at least.  We will plan for stone follow-up in 6 months with KUB prior to assess any interval stone growth.  Return in about 6 months (around 01/29/2022) for Stone f/u with UA + KUB prior.  Debroah Loop, PA-C  [facility] [address], [place], [place]  [address] [phone]

## 2021-07-31 LAB — URINALYSIS, COMPLETE
Bilirubin, UA: NEGATIVE
Glucose, UA: NEGATIVE
Ketones, UA: NEGATIVE
Leukocytes,UA: NEGATIVE
Nitrite, UA: NEGATIVE
Protein,UA: NEGATIVE
RBC, UA: NEGATIVE
Specific Gravity, UA: 1.025 (ref 1.005–1.030)
Urobilinogen, Ur: 0.2 mg/dL (ref 0.2–1.0)
pH, UA: 6 (ref 5.0–7.5)

## 2021-07-31 LAB — MICROSCOPIC EXAMINATION: Bacteria, UA: NONE SEEN

## 2021-08-03 ENCOUNTER — Other Ambulatory Visit: Payer: 59

## 2021-08-03 DIAGNOSIS — N201 Calculus of ureter: Secondary | ICD-10-CM | POA: Diagnosis not present

## 2021-08-07 LAB — CALCULI, WITH PHOTOGRAPH (CLINICAL LAB)
Calcium Oxalate Dihydrate: 30 %
Calcium Oxalate Monohydrate: 50 %
Hydroxyapatite: 20 %
Weight Calculi: 60 mg

## 2021-08-11 ENCOUNTER — Other Ambulatory Visit: Payer: Self-pay | Admitting: Neurosurgery

## 2021-08-11 DIAGNOSIS — M5416 Radiculopathy, lumbar region: Secondary | ICD-10-CM

## 2021-08-11 DIAGNOSIS — Z981 Arthrodesis status: Secondary | ICD-10-CM

## 2021-08-11 DIAGNOSIS — M47816 Spondylosis without myelopathy or radiculopathy, lumbar region: Secondary | ICD-10-CM

## 2021-08-17 DIAGNOSIS — G8929 Other chronic pain: Secondary | ICD-10-CM | POA: Diagnosis not present

## 2021-08-17 DIAGNOSIS — M79672 Pain in left foot: Secondary | ICD-10-CM | POA: Diagnosis not present

## 2021-08-17 DIAGNOSIS — M79671 Pain in right foot: Secondary | ICD-10-CM | POA: Diagnosis not present

## 2021-08-17 DIAGNOSIS — M792 Neuralgia and neuritis, unspecified: Secondary | ICD-10-CM | POA: Diagnosis not present

## 2021-08-17 DIAGNOSIS — M545 Low back pain, unspecified: Secondary | ICD-10-CM | POA: Diagnosis not present

## 2021-08-17 DIAGNOSIS — M216X2 Other acquired deformities of left foot: Secondary | ICD-10-CM | POA: Diagnosis not present

## 2021-08-17 DIAGNOSIS — M216X1 Other acquired deformities of right foot: Secondary | ICD-10-CM | POA: Diagnosis not present

## 2021-08-21 ENCOUNTER — Ambulatory Visit
Admission: RE | Admit: 2021-08-21 | Discharge: 2021-08-21 | Disposition: A | Discharge status: Home / Self Care | Payer: 59 | Source: Ambulatory Visit | Attending: Neurosurgery | Admitting: Neurosurgery

## 2021-08-21 DIAGNOSIS — Z981 Arthrodesis status: Secondary | ICD-10-CM

## 2021-08-21 DIAGNOSIS — M5416 Radiculopathy, lumbar region: Secondary | ICD-10-CM

## 2021-08-21 DIAGNOSIS — M47816 Spondylosis without myelopathy or radiculopathy, lumbar region: Secondary | ICD-10-CM

## 2021-08-27 ENCOUNTER — Other Ambulatory Visit
Admission: RE | Admit: 2021-08-27 | Discharge: 2021-08-27 | Disposition: A | Discharge status: Home / Self Care | Payer: 59 | Source: Ambulatory Visit | Attending: Pulmonary Disease | Admitting: Pulmonary Disease

## 2021-08-27 DIAGNOSIS — J4540 Moderate persistent asthma, uncomplicated: Secondary | ICD-10-CM | POA: Insufficient documentation

## 2021-08-27 DIAGNOSIS — R0602 Shortness of breath: Secondary | ICD-10-CM | POA: Insufficient documentation

## 2021-08-27 DIAGNOSIS — R7989 Other specified abnormal findings of blood chemistry: Secondary | ICD-10-CM | POA: Diagnosis not present

## 2021-08-27 LAB — D-DIMER, QUANTITATIVE: D-Dimer, Quant: 0.52 ug/mL-FEU — ABNORMAL HIGH (ref 0.00–0.50)

## 2021-08-31 ENCOUNTER — Other Ambulatory Visit: Payer: Self-pay | Admitting: Pulmonary Disease

## 2021-08-31 DIAGNOSIS — J4540 Moderate persistent asthma, uncomplicated: Secondary | ICD-10-CM

## 2021-09-02 ENCOUNTER — Other Ambulatory Visit: Payer: Self-pay | Admitting: Pulmonary Disease

## 2021-09-02 DIAGNOSIS — R0602 Shortness of breath: Secondary | ICD-10-CM

## 2021-09-02 DIAGNOSIS — R7989 Other specified abnormal findings of blood chemistry: Secondary | ICD-10-CM

## 2021-09-03 ENCOUNTER — Ambulatory Visit
Admission: RE | Admit: 2021-09-03 | Discharge: 2021-09-03 | Disposition: A | Discharge status: Home / Self Care | Payer: 59 | Source: Ambulatory Visit | Attending: Pulmonary Disease | Admitting: Pulmonary Disease

## 2021-09-03 DIAGNOSIS — R7989 Other specified abnormal findings of blood chemistry: Secondary | ICD-10-CM

## 2021-09-03 DIAGNOSIS — R918 Other nonspecific abnormal finding of lung field: Secondary | ICD-10-CM | POA: Diagnosis not present

## 2021-09-03 DIAGNOSIS — R2689 Other abnormalities of gait and mobility: Secondary | ICD-10-CM | POA: Diagnosis not present

## 2021-09-03 DIAGNOSIS — R079 Chest pain, unspecified: Secondary | ICD-10-CM | POA: Diagnosis not present

## 2021-09-03 DIAGNOSIS — R0602 Shortness of breath: Secondary | ICD-10-CM

## 2021-09-03 DIAGNOSIS — M25552 Pain in left hip: Secondary | ICD-10-CM | POA: Diagnosis not present

## 2021-09-03 DIAGNOSIS — M5451 Vertebrogenic low back pain: Secondary | ICD-10-CM | POA: Diagnosis not present

## 2021-09-03 MED ORDER — IOPAMIDOL (ISOVUE-370) INJECTION 76%
75.0000 mL | Freq: Once | INTRAVENOUS | Status: AC | PRN
  Administered 2021-09-03: 75 mL via INTRAVENOUS

## 2021-09-08 ENCOUNTER — Other Ambulatory Visit: Payer: Self-pay | Admitting: Family Medicine

## 2021-09-08 DIAGNOSIS — M5416 Radiculopathy, lumbar region: Secondary | ICD-10-CM

## 2021-09-08 DIAGNOSIS — N289 Disorder of kidney and ureter, unspecified: Secondary | ICD-10-CM | POA: Diagnosis not present

## 2021-09-08 DIAGNOSIS — E781 Pure hyperglyceridemia: Secondary | ICD-10-CM | POA: Diagnosis not present

## 2021-09-09 ENCOUNTER — Ambulatory Visit: Payer: 59

## 2021-09-14 DIAGNOSIS — M5451 Vertebrogenic low back pain: Secondary | ICD-10-CM | POA: Diagnosis not present

## 2021-09-14 DIAGNOSIS — R2689 Other abnormalities of gait and mobility: Secondary | ICD-10-CM | POA: Diagnosis not present

## 2021-09-14 DIAGNOSIS — M25552 Pain in left hip: Secondary | ICD-10-CM | POA: Diagnosis not present

## 2021-09-15 DIAGNOSIS — K219 Gastro-esophageal reflux disease without esophagitis: Secondary | ICD-10-CM | POA: Diagnosis not present

## 2021-09-15 DIAGNOSIS — E781 Pure hyperglyceridemia: Secondary | ICD-10-CM | POA: Diagnosis not present

## 2021-09-15 DIAGNOSIS — I10 Essential (primary) hypertension: Secondary | ICD-10-CM | POA: Diagnosis not present

## 2021-09-15 DIAGNOSIS — Z6841 Body Mass Index (BMI) 40.0 and over, adult: Secondary | ICD-10-CM | POA: Diagnosis not present

## 2021-09-15 DIAGNOSIS — M5432 Sciatica, left side: Secondary | ICD-10-CM | POA: Diagnosis not present

## 2021-09-16 DIAGNOSIS — M25552 Pain in left hip: Secondary | ICD-10-CM | POA: Diagnosis not present

## 2021-09-16 DIAGNOSIS — M5451 Vertebrogenic low back pain: Secondary | ICD-10-CM | POA: Diagnosis not present

## 2021-09-16 DIAGNOSIS — R2689 Other abnormalities of gait and mobility: Secondary | ICD-10-CM | POA: Diagnosis not present

## 2021-09-21 DIAGNOSIS — M5451 Vertebrogenic low back pain: Secondary | ICD-10-CM | POA: Diagnosis not present

## 2021-09-21 DIAGNOSIS — R2689 Other abnormalities of gait and mobility: Secondary | ICD-10-CM | POA: Diagnosis not present

## 2021-09-21 DIAGNOSIS — M25552 Pain in left hip: Secondary | ICD-10-CM | POA: Diagnosis not present

## 2021-10-06 ENCOUNTER — Ambulatory Visit: Payer: 59 | Attending: Pulmonary Disease

## 2021-10-08 DIAGNOSIS — Z85828 Personal history of other malignant neoplasm of skin: Secondary | ICD-10-CM | POA: Diagnosis not present

## 2021-10-08 DIAGNOSIS — D2261 Melanocytic nevi of right upper limb, including shoulder: Secondary | ICD-10-CM | POA: Diagnosis not present

## 2021-10-08 DIAGNOSIS — D225 Melanocytic nevi of trunk: Secondary | ICD-10-CM | POA: Diagnosis not present

## 2021-10-08 DIAGNOSIS — L570 Actinic keratosis: Secondary | ICD-10-CM | POA: Diagnosis not present

## 2021-10-08 DIAGNOSIS — D2262 Melanocytic nevi of left upper limb, including shoulder: Secondary | ICD-10-CM | POA: Diagnosis not present

## 2021-10-08 DIAGNOSIS — X32XXXA Exposure to sunlight, initial encounter: Secondary | ICD-10-CM | POA: Diagnosis not present

## 2021-10-23 DIAGNOSIS — J219 Acute bronchiolitis, unspecified: Secondary | ICD-10-CM | POA: Diagnosis not present

## 2021-10-23 DIAGNOSIS — R0602 Shortness of breath: Secondary | ICD-10-CM | POA: Diagnosis not present

## 2021-11-09 DIAGNOSIS — M5442 Lumbago with sciatica, left side: Secondary | ICD-10-CM | POA: Diagnosis not present

## 2021-11-09 DIAGNOSIS — M5440 Lumbago with sciatica, unspecified side: Secondary | ICD-10-CM | POA: Diagnosis not present

## 2021-11-09 DIAGNOSIS — I10 Essential (primary) hypertension: Secondary | ICD-10-CM | POA: Diagnosis not present

## 2021-11-17 ENCOUNTER — Other Ambulatory Visit: Payer: Self-pay | Admitting: Family Medicine

## 2021-11-17 DIAGNOSIS — R1310 Dysphagia, unspecified: Secondary | ICD-10-CM

## 2021-11-25 ENCOUNTER — Encounter: Payer: Self-pay | Admitting: Podiatry

## 2021-11-25 ENCOUNTER — Ambulatory Visit: Payer: 59 | Admitting: Podiatry

## 2021-11-25 ENCOUNTER — Ambulatory Visit (INDEPENDENT_AMBULATORY_CARE_PROVIDER_SITE_OTHER): Payer: 59

## 2021-11-25 DIAGNOSIS — M778 Other enthesopathies, not elsewhere classified: Secondary | ICD-10-CM

## 2021-11-25 MED ORDER — MELOXICAM 15 MG PO TABS: 15.0000 mg | ORAL_TABLET | Freq: Every day | ORAL | 3 refills | Status: DC

## 2021-11-25 MED ORDER — METHYLPREDNISOLONE 4 MG PO TBPK: ORAL_TABLET | ORAL | 0 refills | Status: DC

## 2021-11-25 MED ORDER — TRIAMCINOLONE ACETONIDE 40 MG/ML IJ SUSP
40.0000 mg | Freq: Once | INTRAMUSCULAR | Status: AC
  Administered 2021-11-25: 40 mg

## 2021-11-25 NOTE — Progress Notes (Signed)
Subjective:  Patient ID: Manuel Cain, male    DOB: Aug 05, 1970,  MRN: [id number] HPI Chief Complaint  Patient presents with   Foot Pain    Plantar forefoot bilateral (L<R) - feels knots when walking x couple months, now noticing toes going numb, does have back issues too, tried met pads-usually helps for about 3 days   New Patient (Initial Visit)    51 y.o. male presents with the above complaint.   ROS: Denies fever chills nausea vomiting muscle aches pains calf pain back pain chest pain shortness of breath.  Past Medical History:  Diagnosis Date   Anxiety    Blood in stool    dark pebble like per pt  x4 wks   Chest pain    on going per pt-x 78mo  Chronic ankle pain    Right, s/p MVVO(1607   Chronic hip pain    right, s/p MPXT(0626   Curvature of spine    Dyspnea    Esophagitis    Frequent nosebleeds    x 2 mo per pt (march 2018)   GERD (gastroesophageal reflux disease)    Head injury 04/2016       Hemorrhoids    Hypertension    Kidney stone    Memory changes    x 1 yr-since 2017   Migraine    since 2013   Vasovagal syncope    Past Surgical History:  Procedure Laterality Date   CHOLECYSTECTOMY N/A 08/23/2018   Procedure: LAPAROSCOPIC CHOLECYSTECTOMY;  Surgeon: CHerbert Pun MD;  Location: [facility];  Service: General;  Laterality: N/A;   COLONOSCOPY WITH PROPOFOL N/A 12/16/2016   Procedure: COLONOSCOPY WITH PROPOFOL;  Surgeon: WLucilla Lame MD;  [place]  Service: Endoscopy;  Laterality: N/A;   ESOPHAGOGASTRODUODENOSCOPY (EGD) WITH PROPOFOL N/A 12/16/2016   Procedure: ESOPHAGOGASTRODUODENOSCOPY (EGD) WITH PROPOFOL;  Surgeon: WLucilla Lame MD;  [place]  Service: Endoscopy;  Laterality: N/A;   ESOPHAGOGASTRODUODENOSCOPY (EGD) WITH PROPOFOL N/A 08/09/2018   Procedure: ESOPHAGOGASTRODUODENOSCOPY (EGD) WITH PROPOFOL;  Surgeon: Toledo, TBenay Pike MD;  Location: [facility];  Service: Gastroenterology;   Laterality: N/A;   EXTRACORPOREAL SHOCK WAVE LITHOTRIPSY Right 07/09/2021   Procedure: EXTRACORPOREAL SHOCK WAVE LITHOTRIPSY (ESWL);  Surgeon: SAbbie Sons MD;  Location: [facility];  Service: Urology;  Laterality: Right;   KNEE SURGERY Left    POLYPECTOMY  12/16/2016   Procedure: POLYPECTOMY;  Surgeon: WLucilla Lame MD;  [place]  Service: Endoscopy;;   vastectomy  1999    Current Outpatient Medications:    Budeson-Glycopyrrol-Formoterol (BREZTRI AEROSPHERE) 160-9-4.8 MCG/ACT AERO, Inhale into the lungs., Disp: , Rfl:    meloxicam (MOBIC) 15 MG tablet, Take 1 tablet (15 mg total) by mouth daily., Disp: 30 tablet, Rfl: 3   methylPREDNISolone (MEDROL DOSEPAK) 4 MG TBPK tablet, 6 day dose pack - take as directed, Disp: 21 tablet, Rfl: 0   amLODipine (NORVASC) 10 MG tablet, Take 10 mg by mouth daily., Disp: , Rfl:    Baclofen 5 MG TABS, Take by mouth., Disp: , Rfl:    HYDROcodone-acetaminophen (NORCO/VICODIN) 5-325 MG tablet, Take 1 tablet by mouth every 8 (eight) hours as needed., Disp: , Rfl:    lisinopril-hydrochlorothiazide (ZESTORETIC) 10-12.5 MG tablet, Take 1 tablet by mouth daily., Disp: , Rfl:    Multiple Vitamins-Minerals (CENTRUM SILVER 50+MEN PO), Take 1 tablet by mouth daily. , Disp: , Rfl:    pantoprazole (PROTONIX) 20 MG tablet, Take 40 mg by mouth 2 (two) times daily  before a meal. , Disp: , Rfl:    vitamin B-12 (CYANOCOBALAMIN) 500 MCG tablet, Take 1,500 mcg by mouth daily. , Disp: , Rfl:   Allergies  Allergen Reactions   Valium [Diazepam] Other (See Comments)    convulsions   Review of Systems Objective:  There were no vitals filed for this visit.  General: Well developed, nourished, in no acute distress, alert and oriented x3   Dermatological: Skin is warm, dry and supple bilateral. Nails x 10 are well maintained; remaining integument appears unremarkable at this time. There are no open sores, no preulcerative lesions, no rash or signs of  infection present.  Vascular: Dorsalis Pedis artery and Posterior Tibial artery pedal pulses are 2/4 bilateral with immedate capillary fill time. Pedal hair growth present. No varicosities and no lower extremity edema present bilateral.   Neruologic: Grossly intact via light touch bilateral. Vibratory intact via tuning fork bilateral. Protective threshold with Semmes Wienstein monofilament intact to all pedal sites bilateral. Patellar and Achilles deep tendon reflexes 2+ bilateral. No Babinski or clonus noted bilateral.   Musculoskeletal: No gross boney pedal deformities bilateral. No pain, crepitus, or limitation noted with foot and ankle range of motion bilateral. Muscular strength 5/5 in all groups tested bilateral.  Tenderness on palpation of the second and third metatarsophalangeal joints bilateral pain on end range of motion of the second and third metatarsal phalangeal joints with dorsiflexion and plantarflexion.  This is consistent with capsulitis.  Gait: Unassisted, Nonantalgic.    Radiographs:  Radiographs taken today demonstrate an osseously mature individual no significant osseous abnormalities other than slightly elongated #2 and #3 metatarsals bilaterally.  Assessment & Plan:   Assessment: Capsulitis second and third metatarsal phalangeal joints bilateral foot.  Plan: Started him on methylprednisolone to be followed by meloxicam.  Discussed appropriate shoe gear stretching exercise ice therapy sugar modifications and the possibility of orthotics.  I injected him today with Kenalog 20 mg Kenalog 5 mg Marcaine to the second intermetatarsal space bilaterally.  Tolerated procedure well without complications.  Follow-up with him in 6 weeks may need to consider orthotics offloading the second and third metatarsals.     Samariya Rockhold T. [place], [place]

## 2021-11-27 ENCOUNTER — Other Ambulatory Visit: Payer: Self-pay | Admitting: Family Medicine

## 2021-11-27 ENCOUNTER — Ambulatory Visit
Admission: RE | Admit: 2021-11-27 | Discharge: 2021-11-27 | Disposition: A | Discharge status: Home / Self Care | Payer: 59 | Source: Ambulatory Visit | Attending: Family Medicine | Admitting: Family Medicine

## 2021-11-27 DIAGNOSIS — R1310 Dysphagia, unspecified: Secondary | ICD-10-CM | POA: Diagnosis not present

## 2021-11-27 NOTE — Progress Notes (Signed)
Modified Barium Swallow Progress Note  Patient Details  Name: Manuel Cain MRN: [id number] Date of Birth: 1970/06/04  Today's Date: 11/27/2021  Modified Barium Swallow completed.  Full report located under Chart Review in the Imaging Section.  Brief recommendations include the following:  Clinical Impression   Pt reports history of esophageal dysfunction with esophageal dilation in 2019 and dx of EoE in 2021. Pt is not currently under the care of a GI.   He reports that he was stable until ~ 8 weeks ago when he started taking amlodipine. Pt has strong aversion to taking medication whole, so he reports crushing the amlodipine and his prednisone and taking both medicines as a powder. Since that time, he has been experiencing esophageal burning, discomfort, globus sensation pointing to his thyroid notch.   Pt presents with adequate oropharyngeal abilities when consuming thin liquids via cup, nectar thick liquids via cup, puree and graham crackers with puree. Pt reports having a long standing adversion to taking pills d/t extensive choking episodes. As such, he agreed to taking 1/4 of barium tablet with thin liquids. No s/s of pharyngeal dysphagia were noted. In AP view, tablet appeared to transit into stomach. Pt wth continued report of globus sensation - pointing to the thyroid notch on his throat. Video replay was used to show no barium residue was present. Give pt's extensive history of esophagea dysfunction, recommend pt follow up with GI for further diagnositic evaluation. Pt voiced understanding.   Swallow Evaluation Recommendations   Recommended Consults: Consider GI evaluation;Consider esophageal assessment   SLP Diet Recommendations: Regular solids;Thin liquid   Liquid Administration via: Cup;Straw   Medication Administration: Whole meds with liquid   Supervision: Patient able to self feed   Compensations: Minimize environmental distractions;Slow rate;Small sips/bites    Postural Changes: Seated upright at 90 degrees   Oral Care Recommendations: Oral care BID      Daliya Parchment B. Rutherford Nail, M.S., CCC-SLP, Mining engineer Certified Brain Injury [facility]  [place] Office [phone] Ascom [phone] Fax [phone]

## 2021-12-03 ENCOUNTER — Ambulatory Visit: Payer: 59

## 2021-12-21 DIAGNOSIS — H9203 Otalgia, bilateral: Secondary | ICD-10-CM | POA: Diagnosis not present

## 2021-12-21 DIAGNOSIS — R42 Dizziness and giddiness: Secondary | ICD-10-CM | POA: Diagnosis not present

## 2021-12-21 DIAGNOSIS — Z6839 Body mass index (BMI) 39.0-39.9, adult: Secondary | ICD-10-CM | POA: Diagnosis not present

## 2021-12-30 ENCOUNTER — Ambulatory Visit: Payer: 59 | Admitting: Podiatry

## 2022-02-01 ENCOUNTER — Encounter: Payer: Self-pay | Admitting: Physician Assistant

## 2022-02-01 ENCOUNTER — Ambulatory Visit: Payer: 59 | Admitting: Physician Assistant

## 2022-03-16 DIAGNOSIS — I10 Essential (primary) hypertension: Secondary | ICD-10-CM | POA: Diagnosis not present

## 2022-03-16 DIAGNOSIS — J219 Acute bronchiolitis, unspecified: Secondary | ICD-10-CM | POA: Diagnosis not present

## 2022-03-16 DIAGNOSIS — E781 Pure hyperglyceridemia: Secondary | ICD-10-CM | POA: Diagnosis not present

## 2022-03-18 DIAGNOSIS — D72829 Elevated white blood cell count, unspecified: Secondary | ICD-10-CM | POA: Diagnosis not present

## 2022-03-18 DIAGNOSIS — E781 Pure hyperglyceridemia: Secondary | ICD-10-CM | POA: Diagnosis not present

## 2022-03-18 DIAGNOSIS — I10 Essential (primary) hypertension: Secondary | ICD-10-CM | POA: Diagnosis not present

## 2022-03-18 DIAGNOSIS — K219 Gastro-esophageal reflux disease without esophagitis: Secondary | ICD-10-CM | POA: Diagnosis not present

## 2022-03-18 DIAGNOSIS — Z125 Encounter for screening for malignant neoplasm of prostate: Secondary | ICD-10-CM | POA: Diagnosis not present

## 2022-03-18 DIAGNOSIS — Z6841 Body Mass Index (BMI) 40.0 and over, adult: Secondary | ICD-10-CM | POA: Diagnosis not present

## 2022-03-18 DIAGNOSIS — Z1211 Encounter for screening for malignant neoplasm of colon: Secondary | ICD-10-CM | POA: Diagnosis not present

## 2022-03-18 DIAGNOSIS — B370 Candidal stomatitis: Secondary | ICD-10-CM | POA: Diagnosis not present

## 2022-03-18 DIAGNOSIS — R69 Illness, unspecified: Secondary | ICD-10-CM | POA: Diagnosis not present

## 2022-04-06 DIAGNOSIS — R69 Illness, unspecified: Secondary | ICD-10-CM | POA: Diagnosis not present

## 2022-04-06 DIAGNOSIS — K1370 Unspecified lesions of oral mucosa: Secondary | ICD-10-CM | POA: Diagnosis not present

## 2022-04-06 DIAGNOSIS — K1321 Leukoplakia of oral mucosa, including tongue: Secondary | ICD-10-CM | POA: Diagnosis not present

## 2022-04-06 DIAGNOSIS — Z114 Encounter for screening for human immunodeficiency virus [HIV]: Secondary | ICD-10-CM | POA: Diagnosis not present

## 2022-04-12 ENCOUNTER — Other Ambulatory Visit: Payer: Self-pay | Admitting: Podiatry

## 2022-04-12 DIAGNOSIS — K1321 Leukoplakia of oral mucosa, including tongue: Secondary | ICD-10-CM | POA: Diagnosis not present

## 2022-04-12 DIAGNOSIS — K200 Eosinophilic esophagitis: Secondary | ICD-10-CM | POA: Diagnosis not present

## 2022-04-15 DIAGNOSIS — K1370 Unspecified lesions of oral mucosa: Secondary | ICD-10-CM | POA: Diagnosis not present

## 2022-05-13 DIAGNOSIS — H5310 Unspecified subjective visual disturbances: Secondary | ICD-10-CM | POA: Diagnosis not present

## 2022-05-15 DIAGNOSIS — I10 Essential (primary) hypertension: Secondary | ICD-10-CM | POA: Diagnosis not present

## 2022-05-15 DIAGNOSIS — H9203 Otalgia, bilateral: Secondary | ICD-10-CM | POA: Diagnosis not present

## 2022-05-15 DIAGNOSIS — Z6839 Body mass index (BMI) 39.0-39.9, adult: Secondary | ICD-10-CM | POA: Diagnosis not present

## 2022-08-03 DIAGNOSIS — Z125 Encounter for screening for malignant neoplasm of prostate: Secondary | ICD-10-CM | POA: Diagnosis not present

## 2022-08-03 DIAGNOSIS — D72829 Elevated white blood cell count, unspecified: Secondary | ICD-10-CM | POA: Diagnosis not present

## 2022-08-03 DIAGNOSIS — E781 Pure hyperglyceridemia: Secondary | ICD-10-CM | POA: Diagnosis not present

## 2022-08-03 DIAGNOSIS — I10 Essential (primary) hypertension: Secondary | ICD-10-CM | POA: Diagnosis not present

## 2022-08-10 DIAGNOSIS — Z6841 Body Mass Index (BMI) 40.0 and over, adult: Secondary | ICD-10-CM | POA: Diagnosis not present

## 2022-08-10 DIAGNOSIS — Z0000 Encounter for general adult medical examination without abnormal findings: Secondary | ICD-10-CM | POA: Diagnosis not present

## 2022-08-10 DIAGNOSIS — G5763 Lesion of plantar nerve, bilateral lower limbs: Secondary | ICD-10-CM | POA: Diagnosis not present

## 2022-08-10 DIAGNOSIS — I10 Essential (primary) hypertension: Secondary | ICD-10-CM | POA: Diagnosis not present

## 2022-08-10 DIAGNOSIS — D72829 Elevated white blood cell count, unspecified: Secondary | ICD-10-CM | POA: Diagnosis not present

## 2022-08-25 ENCOUNTER — Ambulatory Visit
Admission: RE | Admit: 2022-08-25 | Discharge: 2022-08-25 | Disposition: A | Discharge status: Home / Self Care | Payer: 59 | Source: Ambulatory Visit | Attending: Physician Assistant | Admitting: Physician Assistant

## 2022-08-25 ENCOUNTER — Other Ambulatory Visit: Payer: Self-pay

## 2022-08-25 ENCOUNTER — Ambulatory Visit
Admission: RE | Admit: 2022-08-25 | Discharge: 2022-08-25 | Disposition: A | Discharge status: Home / Self Care | Payer: 59 | Attending: Physician Assistant | Admitting: Physician Assistant

## 2022-08-25 ENCOUNTER — Encounter: Payer: Self-pay | Admitting: Physician Assistant

## 2022-08-25 ENCOUNTER — Telehealth: Payer: Self-pay | Admitting: Physician Assistant

## 2022-08-25 ENCOUNTER — Ambulatory Visit: Payer: 59 | Admitting: Physician Assistant

## 2022-08-25 VITALS — BP 143/90 | HR 68 | Ht 69.0 in | Wt 287.3 lb

## 2022-08-25 DIAGNOSIS — R109 Unspecified abdominal pain: Secondary | ICD-10-CM | POA: Insufficient documentation

## 2022-08-25 DIAGNOSIS — K4020 Bilateral inguinal hernia, without obstruction or gangrene, not specified as recurrent: Secondary | ICD-10-CM | POA: Diagnosis not present

## 2022-08-25 DIAGNOSIS — N201 Calculus of ureter: Secondary | ICD-10-CM

## 2022-08-25 DIAGNOSIS — N200 Calculus of kidney: Secondary | ICD-10-CM

## 2022-08-25 DIAGNOSIS — K429 Umbilical hernia without obstruction or gangrene: Secondary | ICD-10-CM | POA: Diagnosis not present

## 2022-08-25 DIAGNOSIS — Z87442 Personal history of urinary calculi: Secondary | ICD-10-CM | POA: Diagnosis not present

## 2022-08-25 DIAGNOSIS — R935 Abnormal findings on diagnostic imaging of other abdominal regions, including retroperitoneum: Secondary | ICD-10-CM | POA: Diagnosis not present

## 2022-08-25 LAB — URINALYSIS, COMPLETE
Bilirubin, UA: NEGATIVE
Glucose, UA: NEGATIVE
Ketones, UA: NEGATIVE
Leukocytes,UA: NEGATIVE
Nitrite, UA: NEGATIVE
Protein,UA: NEGATIVE
Specific Gravity, UA: 1.02 (ref 1.005–1.030)
Urobilinogen, Ur: 0.2 mg/dL (ref 0.2–1.0)
pH, UA: 6 (ref 5.0–7.5)

## 2022-08-25 LAB — MICROSCOPIC EXAMINATION

## 2022-08-25 MED ORDER — KETOROLAC TROMETHAMINE 10 MG PO TABS
10.0000 mg | ORAL_TABLET | Freq: Four times a day (QID) | ORAL | 0 refills | Status: DC | PRN
Start: 2022-08-25 — End: 2023-01-04

## 2022-08-25 NOTE — Telephone Encounter (Signed)
On personal review of his stat CT stone study, he has a 6 mm left lower pole stone with no evidence of ureteral stones.  The stone has been present since May 2023, though it is enlarging.  We discussed that nonobstructing stones are not anticipated to be causing flank pain.  I spoke to the patient via telephone and we discussed various options including surveillance, ESWL, and ureteroscopy.  He remains interested in ESWL, but prefers to defer this 2 weeks, which is reasonable.  No need to remain n.p.o. at midnight.  Will plan for future ESWL.  I refilled a short course of Toradol for pain control in the interim.

## 2022-08-25 NOTE — Telephone Encounter (Signed)
I spoke with Mrs. Lance via telephone this afternoon.  Mr. Hoffmann' stat CT stone study is pending, but we discussed that if it confirms a solitary left renal pelvis/UPJ stone, treatment options would include adding him on for left ESWL with Dr. Richardo Hanks tomorrow.  She confirms that he would prefer to go this route over ureteroscopy.  I advised her to keep him n.p.o. after midnight tonight, sips with meds okay.  We discussed no NSAIDs and she confirmed that his last dose of Toradol was yesterday at 0600.  She is unable to drive him right now due to her own postop status and they will try to coordinate with her daughter to see if she can bring him to procedure tomorrow.  We will contact him early tomorrow and if we move forward with ESWL, we discussed that he will need to come to clinic first thing to fill out paperwork before being added on around 8:30 AM for procedure.

## 2022-08-25 NOTE — Progress Notes (Signed)
08/25/2022 11:28 AM   Manuel Cain January 14, 1971 [id number]  CC: Chief Complaint  Patient presents with   Follow-up    Right ureteral stone    HPI: Manuel Cain is a 52 y.o. male with PMH nephrolithiasis who presents today for evaluation of a possible acute stone episode.   Today he reports 2 years of intermittent left flank pain that has acutely worsened in the last 10 days.  The pain radiates to the LUQ and he is having some R LQ discomfort as well.  The pain is now nearly constant, though he is comfortable today in clinic.  He has also noticed some abdominal discomfort/pressure that is consistent with past stone episodes.  He has been taking Flomax and Toradol for symptom relief, Toradol most recently yesterday morning at 6am.  He denies fever, chills, nausea, or vomiting.  KUB today with a 7 mm calcific density within the left renal shadow consistent with a renal pelvis or UPJ stone.  No other radiopaque urolithiasis.  In-office UA today positive for trace intact blood; urine microscopy with 3-10 RBCs/HPF.  PMH: Past Medical History:  Diagnosis Date   Anxiety    Blood in stool    dark pebble like per pt  x4 wks   Chest pain    on going per pt-x 75mo   Chronic ankle pain    Right, s/p EXB(2841)   Chronic hip pain    right, s/p LKG(4010)   Curvature of spine    Dyspnea    Esophagitis    Frequent nosebleeds    x 2 mo per pt (march 2018)   GERD (gastroesophageal reflux disease)    Head injury 04/2016       Hemorrhoids    Hypertension    Kidney stone    Memory changes    x 1 yr-since 2017   Migraine    since 2013   Vasovagal syncope     Surgical History: Past Surgical History:  Procedure Laterality Date   CHOLECYSTECTOMY N/A 08/23/2018   Procedure: LAPAROSCOPIC CHOLECYSTECTOMY;  Surgeon: Carolan Shiver, MD;  Location: [facility];  Service: General;  Laterality: N/A;   COLONOSCOPY WITH PROPOFOL N/A 12/16/2016   Procedure: COLONOSCOPY WITH  PROPOFOL;  Surgeon: Midge Minium, MD;  Location: [facility];  Service: Endoscopy;  Laterality: N/A;   ESOPHAGOGASTRODUODENOSCOPY (EGD) WITH PROPOFOL N/A 12/16/2016   Procedure: ESOPHAGOGASTRODUODENOSCOPY (EGD) WITH PROPOFOL;  Surgeon: Midge Minium, MD;  Location: [facility];  Service: Endoscopy;  Laterality: N/A;   ESOPHAGOGASTRODUODENOSCOPY (EGD) WITH PROPOFOL N/A 08/09/2018   Procedure: ESOPHAGOGASTRODUODENOSCOPY (EGD) WITH PROPOFOL;  Surgeon: Toledo, Boykin Nearing, MD;  Location: [facility];  Service: Gastroenterology;  Laterality: N/A;   EXTRACORPOREAL SHOCK WAVE LITHOTRIPSY Right 07/09/2021   Procedure: EXTRACORPOREAL SHOCK WAVE LITHOTRIPSY (ESWL);  Surgeon: Riki Altes, MD;  Location: [facility];  Service: Urology;  Laterality: Right;   KNEE SURGERY Left    POLYPECTOMY  12/16/2016   Procedure: POLYPECTOMY;  Surgeon: Midge Minium, MD;  Location: [facility];  Service: Endoscopy;;   vastectomy  1999    Home Medications:  Allergies as of 08/25/2022       Reactions   Valium [diazepam] Other (See Comments)   convulsions        Medication List        Accurate as of August 25, 2022 11:28 AM. If you have any questions, ask your nurse or doctor.          amLODipine 10 MG tablet Commonly  known as: NORVASC Take 10 mg by mouth daily.   Baclofen 5 MG Tabs Take by mouth.   Breztri Aerosphere 160-9-4.8 MCG/ACT Aero Generic drug: Budeson-Glycopyrrol-Formoterol Inhale into the lungs.   CENTRUM SILVER 50+MEN PO Take 1 tablet by mouth daily.   cyanocobalamin 500 MCG tablet Commonly known as: VITAMIN B12 Take 1,500 mcg by mouth daily.   HYDROcodone-acetaminophen 5-325 MG tablet Commonly known as: NORCO/VICODIN Take 1 tablet by mouth every 8 (eight) hours as needed.   lisinopril-hydrochlorothiazide 10-12.5 MG tablet Commonly known as: ZESTORETIC Take 1 tablet by mouth daily.   meloxicam 15 MG tablet Commonly known as: MOBIC TAKE 1 TABLET (15 MG  TOTAL) BY MOUTH DAILY.   methylPREDNISolone 4 MG Tbpk tablet Commonly known as: MEDROL DOSEPAK 6 day dose pack - take as directed   pantoprazole 20 MG tablet Commonly known as: PROTONIX Take 40 mg by mouth 2 (two) times daily before a meal.        Allergies:  Allergies  Allergen Reactions   Valium [Diazepam] Other (See Comments)    convulsions    Family History: Family History  Problem Relation Age of Onset   Hypertension Mother    Breast cancer Mother    CAD Father    Heart attack Father    Bladder Cancer Brother     Social History:   reports that he has never smoked. He has never been exposed to tobacco smoke. He has never used smokeless tobacco. He reports that he does not drink alcohol and does not use drugs.  Physical Exam: There were no vitals taken for this visit.  Constitutional:  Alert and oriented, no acute distress, nontoxic appearing HEENT: [place], AT Cardiovascular: No clubbing, cyanosis, or edema Respiratory: Normal respiratory effort, no increased work of breathing Skin: No rashes, bruises or suspicious lesions Neurologic: Grossly intact, no focal deficits, moving all 4 extremities Psychiatric: Normal mood and affect  Laboratory Data: Results for orders placed or performed in visit on 08/25/22  Microscopic Examination   Urine  Result Value Ref Range   WBC, UA 0-5 0 - 5 /hpf   RBC, Urine 3-10 (A) 0 - 2 /hpf   Epithelial Cells (non renal) 0-10 0 - 10 /hpf   Bacteria, UA Few None seen/Few  Urinalysis, Complete  Result Value Ref Range   Specific Gravity, UA 1.020 1.005 - 1.030   pH, UA 6.0 5.0 - 7.5   Color, UA Yellow Yellow   Appearance Ur Clear Clear   Leukocytes,UA Negative Negative   Protein,UA Negative Negative/Trace   Glucose, UA Negative Negative   Ketones, UA Negative Negative   RBC, UA Trace (A) Negative   Bilirubin, UA Negative Negative   Urobilinogen, Ur 0.2 0.2 - 1.0 mg/dL   Nitrite, UA Negative Negative   Microscopic Examination  See below:    Pertinent Imaging: KUB, 08/25/2022: See Epic  I personally reviewed the images referenced above and note a 7 mm calcification in the anticipated region of the left renal pelvis/UPJ.  Assessment & Plan:   1. Flank pain with history of urolithiasis Microscopic hematuria and left flank pain with a 7 mm calcification in the anticipated region of the left renal pelvis/UPJ.  UA otherwise bland, low suspicion for UTI.  With reports of LUQ and RLQ pain, I offered him a CT stone study for definitive management and he agreed.  I sent him from clinic today for stat scan and we will call him with his results.  We discussed that if CT  confirms a solitary left renal pelvic/UPJ stone, treatment options will include ESWL versus ureteroscopy.  He prefers ESWL.  Notably, he has a proximal stone and last Toradol was 0600 on 6/25; will need to confirm candidacy with [facility] protocols if considering add on ESWL tomorrow. - Urinalysis, Complete - CT RENAL STONE STUDY; Future - CULTURE, URINE COMPREHENSIVE   Return for Will call with results.  Carman Ching, PA-C  [facility] [place] [address] [place], [place] [address] [phone]

## 2022-08-26 ENCOUNTER — Other Ambulatory Visit: Payer: Self-pay | Admitting: Physician Assistant

## 2022-08-26 DIAGNOSIS — N200 Calculus of kidney: Secondary | ICD-10-CM

## 2022-08-26 NOTE — Progress Notes (Signed)
ESWL ORDER FORM  Expected date of procedure: 09/09/2022  Surgeon: Vanna Scotland, MD  Post op standing: 2-4wk follow up w/KUB prior  Anticoagulation/Aspirin/NSAID standing order: Hold all 72 hours prior  Anesthesia standing order: MAC  VTE standing: SCD's  Dx: Left Nephrolithiasis  Procedure: left Extracorporeal shock wave lithotripsy  CPT : 50590  Standing Order Set:   *NPO after mn, KUB  *NS 128ml/hr, Keflex 500mg  PO, Benadryl 25mg  PO, Zofran 4mg  IV  NO VALIUM, PATIENT REPORTS ALLERGY (CONVULSIONS)    Medications if other than standing orders:   NONE

## 2022-08-29 LAB — CULTURE, URINE COMPREHENSIVE

## 2022-08-30 ENCOUNTER — Telehealth: Payer: Self-pay

## 2022-08-30 NOTE — Telephone Encounter (Signed)
Called pt to go over options on toradol, pt states he will call us when he decided which one he will choose. States he will not do either right now

## 2022-08-30 NOTE — Telephone Encounter (Signed)
Pt states he will contact us when he decides which option to go with. States he will not do either right now.

## 2022-09-01 LAB — CULTURE, URINE COMPREHENSIVE

## 2022-09-02 LAB — CULTURE, URINE COMPREHENSIVE

## 2022-09-03 ENCOUNTER — Other Ambulatory Visit: Payer: Self-pay | Admitting: Urology

## 2022-09-03 DIAGNOSIS — N390 Urinary tract infection, site not specified: Secondary | ICD-10-CM

## 2022-09-03 MED ORDER — NITROFURANTOIN MONOHYD MACRO 100 MG PO CAPS
100.0000 mg | ORAL_CAPSULE | Freq: Two times a day (BID) | ORAL | 0 refills | Status: DC
Start: 2022-09-03 — End: 2023-01-04

## 2022-09-06 ENCOUNTER — Telehealth: Payer: Self-pay

## 2022-09-06 NOTE — Telephone Encounter (Signed)
Patient called in. States that he is on the schedule for Lithotripsy for Thursday 07/11 but he is going to be out of town. Needs to talk with his ride to see when he will be able to have procedure. Cancelled patient on [facility] OR grid and when patient calls back I will reschedule procedure. Patient verbalized understanding.

## 2022-09-09 ENCOUNTER — Ambulatory Visit: Admission: RE | Admit: 2022-09-09 | Payer: 59 | Source: Home / Self Care | Admitting: Urology

## 2022-09-09 ENCOUNTER — Encounter: Admission: RE | Payer: Self-pay | Source: Home / Self Care

## 2022-09-09 SURGERY — LITHOTRIPSY, ESWL
Anesthesia: Moderate Sedation | Laterality: Left

## 2022-09-16 DIAGNOSIS — T07XXXA Unspecified multiple injuries, initial encounter: Secondary | ICD-10-CM | POA: Diagnosis not present

## 2022-09-16 DIAGNOSIS — Z03818 Encounter for observation for suspected exposure to other biological agents ruled out: Secondary | ICD-10-CM | POA: Diagnosis not present

## 2022-09-16 DIAGNOSIS — L089 Local infection of the skin and subcutaneous tissue, unspecified: Secondary | ICD-10-CM | POA: Diagnosis not present

## 2022-09-16 DIAGNOSIS — R6889 Other general symptoms and signs: Secondary | ICD-10-CM | POA: Diagnosis not present

## 2022-09-16 DIAGNOSIS — W57XXXA Bitten or stung by nonvenomous insect and other nonvenomous arthropods, initial encounter: Secondary | ICD-10-CM | POA: Diagnosis not present

## 2022-09-19 ENCOUNTER — Other Ambulatory Visit: Payer: Self-pay

## 2022-09-19 ENCOUNTER — Emergency Department
Admission: EM | Admit: 2022-09-19 | Discharge: 2022-09-19 | Discharge status: Against Medical Advice | Payer: 59 | Attending: Emergency Medicine | Admitting: Emergency Medicine

## 2022-09-19 DIAGNOSIS — Z5321 Procedure and treatment not carried out due to patient leaving prior to being seen by health care provider: Secondary | ICD-10-CM | POA: Diagnosis not present

## 2022-09-19 DIAGNOSIS — W57XXXA Bitten or stung by nonvenomous insect and other nonvenomous arthropods, initial encounter: Secondary | ICD-10-CM | POA: Insufficient documentation

## 2022-09-19 DIAGNOSIS — S80862A Insect bite (nonvenomous), left lower leg, initial encounter: Secondary | ICD-10-CM | POA: Insufficient documentation

## 2022-09-19 NOTE — ED Triage Notes (Signed)
Pt to ed from home via POV for insect bite. Pt was seen on 7/18 at [facility] clinic and was prescribed antibiotics but isnt getting better. Pt was prescribed doxy and a cream to rub on it. Pt has large amount of redness to lower left leg. No heat on palpation and pt is afebrile. Pt is caox4, in no acute distress and ambulatory in triage. Pt is concerned the redness is spreading too rapidly.

## 2022-09-27 ENCOUNTER — Other Ambulatory Visit
Admission: RE | Admit: 2022-09-27 | Discharge: 2022-09-27 | Disposition: A | Discharge status: Home / Self Care | Payer: 59 | Source: Ambulatory Visit | Attending: Family Medicine | Admitting: Family Medicine

## 2022-09-27 DIAGNOSIS — R600 Localized edema: Secondary | ICD-10-CM | POA: Insufficient documentation

## 2022-09-27 LAB — D-DIMER, QUANTITATIVE: D-Dimer, Quant: 0.53 ug/mL-FEU — ABNORMAL HIGH (ref 0.00–0.50)

## 2022-09-28 ENCOUNTER — Ambulatory Visit
Admission: RE | Admit: 2022-09-28 | Discharge: 2022-09-28 | Disposition: A | Discharge status: Home / Self Care | Payer: 59 | Source: Ambulatory Visit | Attending: Family Medicine | Admitting: Family Medicine

## 2022-09-28 ENCOUNTER — Other Ambulatory Visit: Payer: Self-pay | Admitting: Family Medicine

## 2022-09-28 DIAGNOSIS — R600 Localized edema: Secondary | ICD-10-CM | POA: Diagnosis not present

## 2022-09-28 DIAGNOSIS — M79605 Pain in left leg: Secondary | ICD-10-CM | POA: Diagnosis not present

## 2022-09-30 DIAGNOSIS — L089 Local infection of the skin and subcutaneous tissue, unspecified: Secondary | ICD-10-CM | POA: Diagnosis not present

## 2022-09-30 DIAGNOSIS — W57XXXA Bitten or stung by nonvenomous insect and other nonvenomous arthropods, initial encounter: Secondary | ICD-10-CM | POA: Diagnosis not present

## 2022-09-30 DIAGNOSIS — M7918 Myalgia, other site: Secondary | ICD-10-CM | POA: Diagnosis not present

## 2022-09-30 DIAGNOSIS — R600 Localized edema: Secondary | ICD-10-CM | POA: Diagnosis not present

## 2022-09-30 DIAGNOSIS — S90562A Insect bite (nonvenomous), left ankle, initial encounter: Secondary | ICD-10-CM | POA: Diagnosis not present

## 2022-10-21 DIAGNOSIS — R600 Localized edema: Secondary | ICD-10-CM | POA: Diagnosis not present

## 2022-10-27 DIAGNOSIS — M4726 Other spondylosis with radiculopathy, lumbar region: Secondary | ICD-10-CM | POA: Diagnosis not present

## 2022-10-29 DIAGNOSIS — S86012A Strain of left Achilles tendon, initial encounter: Secondary | ICD-10-CM | POA: Diagnosis not present

## 2022-11-04 DIAGNOSIS — D72829 Elevated white blood cell count, unspecified: Secondary | ICD-10-CM | POA: Diagnosis not present

## 2022-11-04 DIAGNOSIS — I10 Essential (primary) hypertension: Secondary | ICD-10-CM | POA: Diagnosis not present

## 2022-11-08 ENCOUNTER — Ambulatory Visit: Payer: 59 | Admitting: Podiatry

## 2022-11-10 DIAGNOSIS — M4726 Other spondylosis with radiculopathy, lumbar region: Secondary | ICD-10-CM | POA: Diagnosis not present

## 2022-11-12 DIAGNOSIS — M9901 Segmental and somatic dysfunction of cervical region: Secondary | ICD-10-CM | POA: Diagnosis not present

## 2022-11-12 DIAGNOSIS — M9902 Segmental and somatic dysfunction of thoracic region: Secondary | ICD-10-CM | POA: Diagnosis not present

## 2022-11-12 DIAGNOSIS — M9904 Segmental and somatic dysfunction of sacral region: Secondary | ICD-10-CM | POA: Diagnosis not present

## 2022-11-12 DIAGNOSIS — M9903 Segmental and somatic dysfunction of lumbar region: Secondary | ICD-10-CM | POA: Diagnosis not present

## 2022-11-15 DIAGNOSIS — M4726 Other spondylosis with radiculopathy, lumbar region: Secondary | ICD-10-CM | POA: Diagnosis not present

## 2022-11-17 DIAGNOSIS — M9901 Segmental and somatic dysfunction of cervical region: Secondary | ICD-10-CM | POA: Diagnosis not present

## 2022-11-17 DIAGNOSIS — M9904 Segmental and somatic dysfunction of sacral region: Secondary | ICD-10-CM | POA: Diagnosis not present

## 2022-11-17 DIAGNOSIS — M9902 Segmental and somatic dysfunction of thoracic region: Secondary | ICD-10-CM | POA: Diagnosis not present

## 2022-11-17 DIAGNOSIS — M9903 Segmental and somatic dysfunction of lumbar region: Secondary | ICD-10-CM | POA: Diagnosis not present

## 2022-11-18 DIAGNOSIS — M9902 Segmental and somatic dysfunction of thoracic region: Secondary | ICD-10-CM | POA: Diagnosis not present

## 2022-11-18 DIAGNOSIS — M9904 Segmental and somatic dysfunction of sacral region: Secondary | ICD-10-CM | POA: Diagnosis not present

## 2022-11-18 DIAGNOSIS — M9903 Segmental and somatic dysfunction of lumbar region: Secondary | ICD-10-CM | POA: Diagnosis not present

## 2022-11-18 DIAGNOSIS — M9901 Segmental and somatic dysfunction of cervical region: Secondary | ICD-10-CM | POA: Diagnosis not present

## 2022-11-19 ENCOUNTER — Ambulatory Visit: Payer: 59 | Attending: Family Medicine | Admitting: Occupational Therapy

## 2022-11-19 ENCOUNTER — Encounter: Payer: Self-pay | Admitting: Occupational Therapy

## 2022-11-19 DIAGNOSIS — I890 Lymphedema, not elsewhere classified: Secondary | ICD-10-CM | POA: Insufficient documentation
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
## 2022-11-22 DIAGNOSIS — M9903 Segmental and somatic dysfunction of lumbar region: Secondary | ICD-10-CM | POA: Diagnosis not present

## 2022-11-22 DIAGNOSIS — M9904 Segmental and somatic dysfunction of sacral region: Secondary | ICD-10-CM | POA: Diagnosis not present

## 2022-11-22 DIAGNOSIS — M9902 Segmental and somatic dysfunction of thoracic region: Secondary | ICD-10-CM | POA: Diagnosis not present

## 2022-11-22 DIAGNOSIS — M9901 Segmental and somatic dysfunction of cervical region: Secondary | ICD-10-CM | POA: Diagnosis not present

## 2022-11-25 DIAGNOSIS — M9901 Segmental and somatic dysfunction of cervical region: Secondary | ICD-10-CM | POA: Diagnosis not present

## 2022-11-25 DIAGNOSIS — M9903 Segmental and somatic dysfunction of lumbar region: Secondary | ICD-10-CM | POA: Diagnosis not present

## 2022-11-25 DIAGNOSIS — M9904 Segmental and somatic dysfunction of sacral region: Secondary | ICD-10-CM | POA: Diagnosis not present

## 2022-11-25 DIAGNOSIS — M9902 Segmental and somatic dysfunction of thoracic region: Secondary | ICD-10-CM | POA: Diagnosis not present

## 2022-12-01 DIAGNOSIS — M9901 Segmental and somatic dysfunction of cervical region: Secondary | ICD-10-CM | POA: Diagnosis not present

## 2022-12-01 DIAGNOSIS — M9904 Segmental and somatic dysfunction of sacral region: Secondary | ICD-10-CM | POA: Diagnosis not present

## 2022-12-01 DIAGNOSIS — M9903 Segmental and somatic dysfunction of lumbar region: Secondary | ICD-10-CM | POA: Diagnosis not present

## 2022-12-01 DIAGNOSIS — M9902 Segmental and somatic dysfunction of thoracic region: Secondary | ICD-10-CM | POA: Diagnosis not present

## 2022-12-02 ENCOUNTER — Ambulatory Visit: Payer: 59 | Attending: Family Medicine | Admitting: Occupational Therapy

## 2022-12-02 ENCOUNTER — Encounter: Payer: Self-pay | Admitting: Occupational Therapy

## 2022-12-02 DIAGNOSIS — I890 Lymphedema, not elsewhere classified: Secondary | ICD-10-CM | POA: Insufficient documentation

## 2022-12-02 DIAGNOSIS — M9904 Segmental and somatic dysfunction of sacral region: Secondary | ICD-10-CM | POA: Diagnosis not present

## 2022-12-02 DIAGNOSIS — M9903 Segmental and somatic dysfunction of lumbar region: Secondary | ICD-10-CM | POA: Diagnosis not present

## 2022-12-02 DIAGNOSIS — M9901 Segmental and somatic dysfunction of cervical region: Secondary | ICD-10-CM | POA: Diagnosis not present

## 2022-12-02 DIAGNOSIS — M9902 Segmental and somatic dysfunction of thoracic region: Secondary | ICD-10-CM | POA: Diagnosis not present
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
## 2022-12-06 DIAGNOSIS — M9903 Segmental and somatic dysfunction of lumbar region: Secondary | ICD-10-CM | POA: Diagnosis not present

## 2022-12-06 DIAGNOSIS — M9902 Segmental and somatic dysfunction of thoracic region: Secondary | ICD-10-CM | POA: Diagnosis not present

## 2022-12-06 DIAGNOSIS — M9904 Segmental and somatic dysfunction of sacral region: Secondary | ICD-10-CM | POA: Diagnosis not present

## 2022-12-06 DIAGNOSIS — M9901 Segmental and somatic dysfunction of cervical region: Secondary | ICD-10-CM | POA: Diagnosis not present

## 2022-12-07 ENCOUNTER — Ambulatory Visit: Payer: 59 | Admitting: Occupational Therapy

## 2022-12-07 ENCOUNTER — Encounter: Payer: Self-pay | Admitting: Occupational Therapy

## 2022-12-07 DIAGNOSIS — I890 Lymphedema, not elsewhere classified: Secondary | ICD-10-CM | POA: Diagnosis not present
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
## 2022-12-08 DIAGNOSIS — Z85828 Personal history of other malignant neoplasm of skin: Secondary | ICD-10-CM | POA: Diagnosis not present

## 2022-12-08 DIAGNOSIS — L820 Inflamed seborrheic keratosis: Secondary | ICD-10-CM | POA: Diagnosis not present

## 2022-12-08 DIAGNOSIS — D2271 Melanocytic nevi of right lower limb, including hip: Secondary | ICD-10-CM | POA: Diagnosis not present

## 2022-12-08 DIAGNOSIS — D2261 Melanocytic nevi of right upper limb, including shoulder: Secondary | ICD-10-CM | POA: Diagnosis not present

## 2022-12-08 DIAGNOSIS — L538 Other specified erythematous conditions: Secondary | ICD-10-CM | POA: Diagnosis not present

## 2022-12-08 DIAGNOSIS — D225 Melanocytic nevi of trunk: Secondary | ICD-10-CM | POA: Diagnosis not present

## 2022-12-08 DIAGNOSIS — D2262 Melanocytic nevi of left upper limb, including shoulder: Secondary | ICD-10-CM | POA: Diagnosis not present

## 2022-12-08 DIAGNOSIS — D2272 Melanocytic nevi of left lower limb, including hip: Secondary | ICD-10-CM | POA: Diagnosis not present

## 2022-12-08 DIAGNOSIS — L821 Other seborrheic keratosis: Secondary | ICD-10-CM | POA: Diagnosis not present

## 2022-12-08 DIAGNOSIS — L570 Actinic keratosis: Secondary | ICD-10-CM | POA: Diagnosis not present

## 2022-12-09 ENCOUNTER — Ambulatory Visit: Payer: 59 | Admitting: Occupational Therapy

## 2022-12-09 DIAGNOSIS — M9903 Segmental and somatic dysfunction of lumbar region: Secondary | ICD-10-CM | POA: Diagnosis not present

## 2022-12-09 DIAGNOSIS — M9904 Segmental and somatic dysfunction of sacral region: Secondary | ICD-10-CM | POA: Diagnosis not present

## 2022-12-09 DIAGNOSIS — M9902 Segmental and somatic dysfunction of thoracic region: Secondary | ICD-10-CM | POA: Diagnosis not present

## 2022-12-09 DIAGNOSIS — M9901 Segmental and somatic dysfunction of cervical region: Secondary | ICD-10-CM | POA: Diagnosis not present

## 2022-12-14 ENCOUNTER — Ambulatory Visit: Payer: 59 | Admitting: Occupational Therapy

## 2022-12-16 ENCOUNTER — Ambulatory Visit: Payer: 59 | Admitting: Occupational Therapy

## 2022-12-16 ENCOUNTER — Telehealth: Payer: Self-pay | Admitting: Occupational Therapy

## 2022-12-16 DIAGNOSIS — M9901 Segmental and somatic dysfunction of cervical region: Secondary | ICD-10-CM | POA: Diagnosis not present

## 2022-12-16 DIAGNOSIS — M9904 Segmental and somatic dysfunction of sacral region: Secondary | ICD-10-CM | POA: Diagnosis not present

## 2022-12-16 DIAGNOSIS — M9903 Segmental and somatic dysfunction of lumbar region: Secondary | ICD-10-CM | POA: Diagnosis not present

## 2022-12-16 DIAGNOSIS — M9902 Segmental and somatic dysfunction of thoracic region: Secondary | ICD-10-CM | POA: Diagnosis not present

## 2022-12-16 NOTE — Telephone Encounter (Signed)
Pr removed from OT Lymphedema Rx schedule after 2 missed visit w/o call to cancel. Pt notified by phone that he'll need a new referral to resume Rx.

## 2022-12-20 DIAGNOSIS — M9903 Segmental and somatic dysfunction of lumbar region: Secondary | ICD-10-CM | POA: Diagnosis not present

## 2022-12-20 DIAGNOSIS — M9904 Segmental and somatic dysfunction of sacral region: Secondary | ICD-10-CM | POA: Diagnosis not present

## 2022-12-20 DIAGNOSIS — M9902 Segmental and somatic dysfunction of thoracic region: Secondary | ICD-10-CM | POA: Diagnosis not present

## 2022-12-20 DIAGNOSIS — M9901 Segmental and somatic dysfunction of cervical region: Secondary | ICD-10-CM | POA: Diagnosis not present

## 2022-12-21 ENCOUNTER — Ambulatory Visit: Payer: 59 | Admitting: Occupational Therapy

## 2022-12-22 DIAGNOSIS — M9902 Segmental and somatic dysfunction of thoracic region: Secondary | ICD-10-CM | POA: Diagnosis not present

## 2022-12-22 DIAGNOSIS — M9904 Segmental and somatic dysfunction of sacral region: Secondary | ICD-10-CM | POA: Diagnosis not present

## 2022-12-22 DIAGNOSIS — M9901 Segmental and somatic dysfunction of cervical region: Secondary | ICD-10-CM | POA: Diagnosis not present

## 2022-12-22 DIAGNOSIS — M9903 Segmental and somatic dysfunction of lumbar region: Secondary | ICD-10-CM | POA: Diagnosis not present

## 2022-12-23 ENCOUNTER — Ambulatory Visit: Payer: 59 | Admitting: Occupational Therapy

## 2022-12-24 DIAGNOSIS — M9902 Segmental and somatic dysfunction of thoracic region: Secondary | ICD-10-CM | POA: Diagnosis not present

## 2022-12-24 DIAGNOSIS — M9901 Segmental and somatic dysfunction of cervical region: Secondary | ICD-10-CM | POA: Diagnosis not present

## 2022-12-24 DIAGNOSIS — M9903 Segmental and somatic dysfunction of lumbar region: Secondary | ICD-10-CM | POA: Diagnosis not present

## 2022-12-24 DIAGNOSIS — M9904 Segmental and somatic dysfunction of sacral region: Secondary | ICD-10-CM | POA: Diagnosis not present

## 2022-12-28 ENCOUNTER — Ambulatory Visit: Payer: 59 | Admitting: Occupational Therapy

## 2022-12-30 ENCOUNTER — Ambulatory Visit: Payer: 59 | Admitting: Occupational Therapy

## 2022-12-31 NOTE — Progress Notes (Unsigned)
Referring Physician:  Marisue Ivan, MD [phone] [facility] [address] [facility] [place],  [place] [address]  Primary Physician:  Marisue Ivan, MD  History of Present Illness: 01/04/2023 [name] has a history of HTN, migraines, GERD, idiopathic peripheral neuropathy, hypertriglyceridemia, obesity.   He is s/p XLIF L4-L5 with PSF by Dr. Myer Haff on 12/11/19.   He has 7 month history of constant LBP with left leg pain posterior to his foot. He has numbness in his calf and tingling in his foot. He has spasms in his left butt. He has a lot of stiffness/tightness in the morning that improves with sitting in recliner with heat for 20 minutes- this is worse in the morning. Leg is so tight that he has trouble walking. No weakness.   Not much relief with motrin.   Bowel/Bladder Dysfunction: none  Conservative measures:  Physical therapy: has participated in PT for 5 weeks (twice a week) at [place] in August. Has been seeing chiropractor for 6 weeks (2-3 x per week) with short term relief Multimodal medical therapy including regular antiinflammatories: norco, toradol, mobic, medrol dose pack  Injections: no epidural steroid injections  Past Surgery:  XLIF L4-L5 with PSF by Dr. Myer Haff on 12/11/19  Manuel Cain has no symptoms of cervical myelopathy.  The symptoms are causing a significant impact on the patient's life.   Review of Systems:  A 10 point review of systems is negative, except for the pertinent positives and negatives detailed in the HPI.  Past Medical History: Past Medical History:  Diagnosis Date   Anxiety    Blood in stool    dark pebble like per pt  x4 wks   Chest pain    on going per pt-x 6mo   Chronic ankle pain    Right, s/p YQM(5784)   Chronic hip pain    right, s/p ONG(2952)   Curvature of spine    Dyspnea    Esophagitis    Frequent nosebleeds    x 2 mo per pt (march 2018)   GERD (gastroesophageal reflux disease)     Head injury 04/2016       Hemorrhoids    Hypertension    Kidney stone    Memory changes    x 1 yr-since 2017   Migraine    since 2013   Vasovagal syncope     Past Surgical History: Past Surgical History:  Procedure Laterality Date   CHOLECYSTECTOMY N/A 08/23/2018   Procedure: LAPAROSCOPIC CHOLECYSTECTOMY;  Surgeon: Carolan Shiver, MD;  Location: [facility];  Service: General;  Laterality: N/A;   COLONOSCOPY WITH PROPOFOL N/A 12/16/2016   Procedure: COLONOSCOPY WITH PROPOFOL;  Surgeon: Midge Minium, MD;  Location: [facility];  Service: Endoscopy;  Laterality: N/A;   ESOPHAGOGASTRODUODENOSCOPY (EGD) WITH PROPOFOL N/A 12/16/2016   Procedure: ESOPHAGOGASTRODUODENOSCOPY (EGD) WITH PROPOFOL;  Surgeon: Midge Minium, MD;  Location: [facility];  Service: Endoscopy;  Laterality: N/A;   ESOPHAGOGASTRODUODENOSCOPY (EGD) WITH PROPOFOL N/A 08/09/2018   Procedure: ESOPHAGOGASTRODUODENOSCOPY (EGD) WITH PROPOFOL;  Surgeon: Toledo, Boykin Nearing, MD;  Location: [facility];  Service: Gastroenterology;  Laterality: N/A;   EXTRACORPOREAL SHOCK WAVE LITHOTRIPSY Right 07/09/2021   Procedure: EXTRACORPOREAL SHOCK WAVE LITHOTRIPSY (ESWL);  Surgeon: Riki Altes, MD;  Location: [facility];  Service: Urology;  Laterality: Right;   KNEE SURGERY Left    POLYPECTOMY  12/16/2016   Procedure: POLYPECTOMY;  Surgeon: Midge Minium, MD;  Location: [facility];  Service: Endoscopy;;   vastectomy  1999  Allergies: Allergies as of 01/04/2023 - Review Complete 01/04/2023  Allergen Reaction Noted   Valium [diazepam] Other (See Comments) 06/25/2016   Sulfamethoxazole-trimethoprim Other (See Comments) 09/30/2022    Medications: Outpatient Encounter Medications as of 01/04/2023  Medication Sig   amLODipine (NORVASC) 10 MG tablet Take 10 mg by mouth daily.   Multiple Vitamins-Minerals (CENTRUM SILVER 50+MEN PO) Take 1 tablet by mouth daily.    pantoprazole (PROTONIX) 20 MG tablet Take 20  mg by mouth 2 (two) times daily before a meal.   [DISCONTINUED] Baclofen 5 MG TABS Take by mouth.   [DISCONTINUED] HYDROcodone-acetaminophen (NORCO/VICODIN) 5-325 MG tablet Take 1 tablet by mouth every 8 (eight) hours as needed.   [DISCONTINUED] ketorolac (TORADOL) 10 MG tablet Take 1 tablet (10 mg total) by mouth every 6 (six) hours as needed.   [DISCONTINUED] lisinopril-hydrochlorothiazide (ZESTORETIC) 10-12.5 MG tablet Take 1 tablet by mouth daily.   [DISCONTINUED] meloxicam (MOBIC) 15 MG tablet TAKE 1 TABLET (15 MG TOTAL) BY MOUTH DAILY.   [DISCONTINUED] methylPREDNISolone (MEDROL DOSEPAK) 4 MG TBPK tablet 6 day dose pack - take as directed   [DISCONTINUED] nitrofurantoin, macrocrystal-monohydrate, (MACROBID) 100 MG capsule Take 1 capsule (100 mg total) by mouth every 12 (twelve) hours.   [DISCONTINUED] vitamin B-12 (CYANOCOBALAMIN) 500 MCG tablet Take 1,500 mcg by mouth daily.    No facility-administered encounter medications on file as of 01/04/2023.    Social History: Social History   Tobacco Use   Smoking status: Never    Passive exposure: Never   Smokeless tobacco: Never  Vaping Use   Vaping status: Never Used  Substance Use Topics   Alcohol use: No   Drug use: No    Family Medical History: Family History  Problem Relation Age of Onset   Hypertension Mother    Breast cancer Mother    CAD Father    Heart attack Father    Bladder Cancer Brother     Physical Examination: Vitals:   01/04/23 1409  BP: 122/80    General: Patient is well developed, well nourished, calm, collected, and in no apparent distress. Attention to examination is appropriate.  Respiratory: Patient is breathing without any difficulty.   NEUROLOGICAL:     Awake, alert, oriented to person, place, and time.  Speech is clear and fluent. Fund of knowledge is appropriate.   Cranial Nerves: Pupils equal round and reactive to light.  Facial tone is symmetric.    Incisions well healed.   No  posterior lumbar tenderness.   No abnormal lesions on exposed skin.   Strength: Side Biceps Triceps Deltoid Interossei Grip Wrist Ext. Wrist Flex.  R 5 5 5 5 5 5 5   L 5 5 5 5 5 5 5    Side Iliopsoas Quads Hamstring PF DF EHL  R 5 5 5 5 5 5   L 5 5 5 5 5 5    Reflexes are 2+ and symmetric at the biceps, brachioradialis, patella and achilles.   Hoffman's is absent.  Clonus is not present.   Bilateral upper and lower extremity sensation is intact to light touch.     Gait is normal.    Medical Decision Making  Imaging: No recent lumbar imaging.    Assessment and Plan: Mr. Serfass is s/p XLIF L4-L5 with PSF by Dr. Myer Haff on 12/11/19. He did well after surgery.   How with a 7 month history of constant LBP with left leg pain posterior to his foot. He has numbness in his calf and tingling in his  foot. Primary complaint is spasms/tightness in left buttock down his leg that is severe in the morning. He has to sit in recliner with heat for 20 minutes and then it generally improves. He has numbness and tingling. No weakness.   No recent lumbar imaging. Back and left leg pain appear to be lumbar mediated.   Treatment options discussed with patient and following plan made:   - Lumbar xrays with flexion/extension.  - MRI of lumbar spine to further evaluate lumbar radiculopathy. He requests OPEN MRI at [facility]. Will let me know when MRI is done so we can get the images/report.  - Discussed restarting neurontin. He declines.  - Will follow up for phone or in person visit to review MRI/xray results once I have them back.   I spent a total of 30 minutes in face-to-face and non-face-to-face activities related to this patient's care today including review of outside records, review of imaging, review of symptoms, physical exam, discussion of differential diagnosis, discussion of treatment options, and documentation.   Thank you for involving me in the care of this patient.   Drake Leach  PA-C Dept. of Neurosurgery

## 2023-01-03 ENCOUNTER — Inpatient Hospital Stay
Admission: RE | Admit: 2023-01-03 | Discharge: 2023-01-03 | Disposition: A | Discharge status: Home / Self Care | Payer: Self-pay | Source: Ambulatory Visit | Attending: Orthopedic Surgery | Admitting: Orthopedic Surgery

## 2023-01-03 ENCOUNTER — Other Ambulatory Visit: Payer: Self-pay

## 2023-01-03 DIAGNOSIS — M9903 Segmental and somatic dysfunction of lumbar region: Secondary | ICD-10-CM | POA: Diagnosis not present

## 2023-01-03 DIAGNOSIS — M9904 Segmental and somatic dysfunction of sacral region: Secondary | ICD-10-CM | POA: Diagnosis not present

## 2023-01-03 DIAGNOSIS — Z049 Encounter for examination and observation for unspecified reason: Secondary | ICD-10-CM

## 2023-01-03 DIAGNOSIS — E538 Deficiency of other specified B group vitamins: Secondary | ICD-10-CM | POA: Diagnosis not present

## 2023-01-03 DIAGNOSIS — I890 Lymphedema, not elsewhere classified: Secondary | ICD-10-CM | POA: Diagnosis not present

## 2023-01-03 DIAGNOSIS — D72829 Elevated white blood cell count, unspecified: Secondary | ICD-10-CM | POA: Diagnosis not present

## 2023-01-03 DIAGNOSIS — M9902 Segmental and somatic dysfunction of thoracic region: Secondary | ICD-10-CM | POA: Diagnosis not present

## 2023-01-03 DIAGNOSIS — M9901 Segmental and somatic dysfunction of cervical region: Secondary | ICD-10-CM | POA: Diagnosis not present

## 2023-01-03 DIAGNOSIS — I10 Essential (primary) hypertension: Secondary | ICD-10-CM | POA: Diagnosis not present

## 2023-01-03 DIAGNOSIS — Z6841 Body Mass Index (BMI) 40.0 and over, adult: Secondary | ICD-10-CM | POA: Diagnosis not present

## 2023-01-03 DIAGNOSIS — M5416 Radiculopathy, lumbar region: Secondary | ICD-10-CM | POA: Diagnosis not present

## 2023-01-04 ENCOUNTER — Encounter: Payer: Self-pay | Admitting: Orthopedic Surgery

## 2023-01-04 ENCOUNTER — Ambulatory Visit: Payer: 59 | Admitting: Occupational Therapy

## 2023-01-04 ENCOUNTER — Ambulatory Visit: Payer: 59 | Admitting: Orthopedic Surgery

## 2023-01-04 ENCOUNTER — Ambulatory Visit
Admission: RE | Admit: 2023-01-04 | Discharge: 2023-01-04 | Disposition: A | Discharge status: Home / Self Care | Payer: 59 | Source: Ambulatory Visit | Attending: Orthopedic Surgery | Admitting: Orthopedic Surgery

## 2023-01-04 ENCOUNTER — Ambulatory Visit
Admission: RE | Admit: 2023-01-04 | Discharge: 2023-01-04 | Disposition: A | Discharge status: Home / Self Care | Payer: 59 | Attending: Orthopedic Surgery | Admitting: Orthopedic Surgery

## 2023-01-04 VITALS — BP 122/80 | Ht 69.0 in | Wt 295.0 lb

## 2023-01-04 DIAGNOSIS — Z981 Arthrodesis status: Secondary | ICD-10-CM

## 2023-01-04 DIAGNOSIS — M5416 Radiculopathy, lumbar region: Secondary | ICD-10-CM

## 2023-01-04 DIAGNOSIS — M545 Low back pain, unspecified: Secondary | ICD-10-CM

## 2023-01-04 DIAGNOSIS — M419 Scoliosis, unspecified: Secondary | ICD-10-CM | POA: Diagnosis not present

## 2023-01-04 DIAGNOSIS — G8929 Other chronic pain: Secondary | ICD-10-CM

## 2023-01-04 NOTE — Patient Instructions (Signed)
It was so nice to see you today. Thank you so much for coming in.   I want to get an MRI of your lower back to look into things further. We will get this approved through your insurance and [facility] will call you to schedule the appointment.   Once you have the MRI done, please let me know so we can work on getting the images/results.   I also want you to get lumbar xrays. You can get these today at [facility] (building with the white pillars) is located off of [address]. The address is [address], [place], [place] [address].    Once I have the MRI and xray results, we will call you to schedule a follow up visit with me to review them. We can do an in person visit or a phone visit. Either is fine with me.   If you decide you want to try the neurontin/gabapentin then let me know.   Please do not hesitate to call if you have any questions or concerns. You can also message me in MyChart.   Drake Leach PA-C [phone]     The physicians and staff at [facility] at [facility] are committed to providing excellent care. You may receive a survey asking for feedback about your experience at our office. We value you your feedback and appreciate you taking the time to to fill it out. [facility] leadership team is also available to discuss your experience in person, feel free to contact us [phone].

## 2023-01-06 ENCOUNTER — Ambulatory Visit: Payer: 59 | Admitting: Occupational Therapy

## 2023-01-06 DIAGNOSIS — M9903 Segmental and somatic dysfunction of lumbar region: Secondary | ICD-10-CM | POA: Diagnosis not present

## 2023-01-06 DIAGNOSIS — M9902 Segmental and somatic dysfunction of thoracic region: Secondary | ICD-10-CM | POA: Diagnosis not present

## 2023-01-06 DIAGNOSIS — M9901 Segmental and somatic dysfunction of cervical region: Secondary | ICD-10-CM | POA: Diagnosis not present

## 2023-01-06 DIAGNOSIS — M9904 Segmental and somatic dysfunction of sacral region: Secondary | ICD-10-CM | POA: Diagnosis not present

## 2023-01-10 DIAGNOSIS — M9904 Segmental and somatic dysfunction of sacral region: Secondary | ICD-10-CM | POA: Diagnosis not present

## 2023-01-10 DIAGNOSIS — M9901 Segmental and somatic dysfunction of cervical region: Secondary | ICD-10-CM | POA: Diagnosis not present

## 2023-01-10 DIAGNOSIS — M9903 Segmental and somatic dysfunction of lumbar region: Secondary | ICD-10-CM | POA: Diagnosis not present

## 2023-01-10 DIAGNOSIS — M9902 Segmental and somatic dysfunction of thoracic region: Secondary | ICD-10-CM | POA: Diagnosis not present

## 2023-01-11 ENCOUNTER — Encounter: Payer: 59 | Admitting: Occupational Therapy

## 2023-01-13 ENCOUNTER — Encounter: Payer: 59 | Admitting: Occupational Therapy

## 2023-01-13 DIAGNOSIS — M9901 Segmental and somatic dysfunction of cervical region: Secondary | ICD-10-CM | POA: Diagnosis not present

## 2023-01-13 DIAGNOSIS — M9904 Segmental and somatic dysfunction of sacral region: Secondary | ICD-10-CM | POA: Diagnosis not present

## 2023-01-13 DIAGNOSIS — M9903 Segmental and somatic dysfunction of lumbar region: Secondary | ICD-10-CM | POA: Diagnosis not present

## 2023-01-13 DIAGNOSIS — M9902 Segmental and somatic dysfunction of thoracic region: Secondary | ICD-10-CM | POA: Diagnosis not present

## 2023-01-14 DIAGNOSIS — Z6841 Body Mass Index (BMI) 40.0 and over, adult: Secondary | ICD-10-CM | POA: Diagnosis not present

## 2023-01-14 DIAGNOSIS — K200 Eosinophilic esophagitis: Secondary | ICD-10-CM | POA: Diagnosis not present

## 2023-01-14 DIAGNOSIS — Z860101 Personal history of adenomatous and serrated colon polyps: Secondary | ICD-10-CM | POA: Diagnosis not present

## 2023-01-14 DIAGNOSIS — Z8719 Personal history of other diseases of the digestive system: Secondary | ICD-10-CM | POA: Diagnosis not present

## 2023-01-17 DIAGNOSIS — M9903 Segmental and somatic dysfunction of lumbar region: Secondary | ICD-10-CM | POA: Diagnosis not present

## 2023-01-17 DIAGNOSIS — M9902 Segmental and somatic dysfunction of thoracic region: Secondary | ICD-10-CM | POA: Diagnosis not present

## 2023-01-17 DIAGNOSIS — M9904 Segmental and somatic dysfunction of sacral region: Secondary | ICD-10-CM | POA: Diagnosis not present

## 2023-01-17 DIAGNOSIS — M9901 Segmental and somatic dysfunction of cervical region: Secondary | ICD-10-CM | POA: Diagnosis not present

## 2023-01-18 ENCOUNTER — Encounter: Payer: 59 | Admitting: Occupational Therapy

## 2023-01-20 ENCOUNTER — Encounter: Payer: 59 | Admitting: Occupational Therapy

## 2023-01-24 ENCOUNTER — Ambulatory Visit: Admission: RE | Admit: 2023-01-24 | Payer: 59 | Source: Ambulatory Visit

## 2023-01-25 ENCOUNTER — Encounter: Payer: 59 | Admitting: Occupational Therapy

## 2023-02-03 ENCOUNTER — Encounter: Payer: 59 | Admitting: Occupational Therapy

## 2023-02-04 ENCOUNTER — Encounter: Payer: Self-pay | Admitting: Internal Medicine

## 2023-02-08 ENCOUNTER — Encounter: Payer: 59 | Admitting: Occupational Therapy

## 2023-02-10 ENCOUNTER — Encounter: Payer: 59 | Admitting: Occupational Therapy

## 2023-03-10 DIAGNOSIS — R197 Diarrhea, unspecified: Secondary | ICD-10-CM | POA: Diagnosis not present

## 2023-03-10 DIAGNOSIS — R1084 Generalized abdominal pain: Secondary | ICD-10-CM | POA: Diagnosis not present

## 2023-03-10 DIAGNOSIS — R194 Change in bowel habit: Secondary | ICD-10-CM | POA: Diagnosis not present

## 2023-03-10 DIAGNOSIS — R195 Other fecal abnormalities: Secondary | ICD-10-CM | POA: Diagnosis not present

## 2023-03-11 DIAGNOSIS — R197 Diarrhea, unspecified: Secondary | ICD-10-CM | POA: Diagnosis not present

## 2023-03-11 DIAGNOSIS — R1084 Generalized abdominal pain: Secondary | ICD-10-CM | POA: Diagnosis not present

## 2023-03-11 DIAGNOSIS — R194 Change in bowel habit: Secondary | ICD-10-CM | POA: Diagnosis not present

## 2023-03-11 DIAGNOSIS — R195 Other fecal abnormalities: Secondary | ICD-10-CM | POA: Diagnosis not present

## 2023-03-22 ENCOUNTER — Encounter: Payer: Self-pay | Admitting: Internal Medicine

## 2023-03-29 ENCOUNTER — Ambulatory Visit: Admit: 2023-03-29 | Payer: 59 | Admitting: Internal Medicine

## 2023-03-29 ENCOUNTER — Ambulatory Visit: Payer: 59 | Admitting: Anesthesiology

## 2023-03-29 ENCOUNTER — Encounter: Payer: Self-pay | Admitting: Internal Medicine

## 2023-03-29 ENCOUNTER — Ambulatory Visit
Admission: RE | Admit: 2023-03-29 | Discharge: 2023-03-29 | Disposition: A | Discharge status: Home / Self Care | Payer: 59 | Attending: Internal Medicine | Admitting: Internal Medicine

## 2023-03-29 ENCOUNTER — Encounter
Admission: RE | Disposition: A | Discharge status: Home / Self Care | Payer: Self-pay | Source: Home / Self Care | Attending: Internal Medicine

## 2023-03-29 DIAGNOSIS — K3189 Other diseases of stomach and duodenum: Secondary | ICD-10-CM | POA: Diagnosis not present

## 2023-03-29 DIAGNOSIS — Z87891 Personal history of nicotine dependence: Secondary | ICD-10-CM | POA: Diagnosis not present

## 2023-03-29 DIAGNOSIS — K219 Gastro-esophageal reflux disease without esophagitis: Secondary | ICD-10-CM | POA: Insufficient documentation

## 2023-03-29 DIAGNOSIS — K222 Esophageal obstruction: Secondary | ICD-10-CM | POA: Insufficient documentation

## 2023-03-29 DIAGNOSIS — D124 Benign neoplasm of descending colon: Secondary | ICD-10-CM | POA: Insufficient documentation

## 2023-03-29 DIAGNOSIS — D123 Benign neoplasm of transverse colon: Secondary | ICD-10-CM | POA: Insufficient documentation

## 2023-03-29 DIAGNOSIS — K200 Eosinophilic esophagitis: Secondary | ICD-10-CM | POA: Diagnosis not present

## 2023-03-29 DIAGNOSIS — K317 Polyp of stomach and duodenum: Secondary | ICD-10-CM | POA: Diagnosis not present

## 2023-03-29 DIAGNOSIS — K2970 Gastritis, unspecified, without bleeding: Secondary | ICD-10-CM | POA: Diagnosis not present

## 2023-03-29 DIAGNOSIS — D128 Benign neoplasm of rectum: Secondary | ICD-10-CM | POA: Insufficient documentation

## 2023-03-29 DIAGNOSIS — Z1211 Encounter for screening for malignant neoplasm of colon: Secondary | ICD-10-CM | POA: Insufficient documentation

## 2023-03-29 DIAGNOSIS — Z860101 Personal history of adenomatous and serrated colon polyps: Secondary | ICD-10-CM | POA: Diagnosis not present

## 2023-03-29 DIAGNOSIS — E66813 Obesity, class 3: Secondary | ICD-10-CM | POA: Diagnosis not present

## 2023-03-29 DIAGNOSIS — Z79899 Other long term (current) drug therapy: Secondary | ICD-10-CM | POA: Diagnosis not present

## 2023-03-29 DIAGNOSIS — I10 Essential (primary) hypertension: Secondary | ICD-10-CM | POA: Diagnosis not present

## 2023-03-29 DIAGNOSIS — K2950 Unspecified chronic gastritis without bleeding: Secondary | ICD-10-CM | POA: Insufficient documentation

## 2023-03-29 DIAGNOSIS — K2289 Other specified disease of esophagus: Secondary | ICD-10-CM | POA: Diagnosis not present

## 2023-03-29 HISTORY — PX: BIOPSY: SHX5522

## 2023-03-29 HISTORY — PX: ESOPHAGOGASTRODUODENOSCOPY: SHX5428

## 2023-03-29 HISTORY — PX: COLONOSCOPY: SHX5424

## 2023-03-29 HISTORY — PX: POLYPECTOMY: SHX5525

## 2023-03-29 SURGERY — COLONOSCOPY
Anesthesia: General

## 2023-03-29 SURGERY — COLONOSCOPY WITH PROPOFOL
Anesthesia: General

## 2023-03-29 MED ORDER — EPHEDRINE 5 MG/ML INJ
INTRAVENOUS | Status: AC
  Filled 2023-03-29: qty 5

## 2023-03-29 MED ORDER — SODIUM CHLORIDE 0.9 % IV SOLN: INTRAVENOUS | Status: DC

## 2023-03-29 MED ORDER — EPHEDRINE SULFATE-NACL 50-0.9 MG/10ML-% IV SOSY
PREFILLED_SYRINGE | INTRAVENOUS | Status: DC | PRN
  Administered 2023-03-29: 10 mg via INTRAVENOUS

## 2023-03-29 MED ORDER — LIDOCAINE HCL (CARDIAC) PF 100 MG/5ML IV SOSY
PREFILLED_SYRINGE | INTRAVENOUS | Status: DC | PRN
  Administered 2023-03-29: 100 mg via INTRAVENOUS

## 2023-03-29 MED ORDER — PROPOFOL 10 MG/ML IV BOLUS
INTRAVENOUS | Status: DC | PRN
  Administered 2023-03-29: 100 mg via INTRAVENOUS

## 2023-03-29 MED ORDER — PROPOFOL 1000 MG/100ML IV EMUL
INTRAVENOUS | Status: AC
  Filled 2023-03-29: qty 100

## 2023-03-29 MED ORDER — PROPOFOL 500 MG/50ML IV EMUL
INTRAVENOUS | Status: DC | PRN
  Administered 2023-03-29: 150 ug/kg/min via INTRAVENOUS

## 2023-03-29 MED ORDER — LIDOCAINE HCL (PF) 2 % IJ SOLN
INTRAMUSCULAR | Status: AC
  Filled 2023-03-29: qty 5

## 2023-03-29 NOTE — Op Note (Signed)
[facility] Gastroenterology Patient Name: Manuel Cain Procedure Date: 03/29/2023 9:00 AM MRN: [id number] Account #: [id number] Date of Birth: 1970/06/19 Admit Type: Outpatient Age: 53 Room: [facility] ENDO ROOM 1 Gender: Male Note Status: Finalized Instrument Name: Upper Endoscope [id number] Procedure:             Upper GI endoscopy Indications:           Suspected gastro-esophageal reflux disease, Failure to                         respond to medical treatment, Eosinophilic esophagitis Providers:             Boykin Nearing. Norma Fredrickson MD, MD Referring MD:          Marisue Ivan (Referring MD) Medicines:             Propofol per Anesthesia Complications:         No immediate complications. Estimated blood loss:                         Minimal. Procedure:             Pre-Anesthesia Assessment:                        - The risks and benefits of the procedure and the                         sedation options and risks were discussed with the                         patient. All questions were answered and informed                         consent was obtained.                        - Patient identification and proposed procedure were                         verified prior to the procedure by the nurse. The                         procedure was verified in the procedure room.                        - ASA Grade Assessment: III - A patient with severe                         systemic disease.                        - After reviewing the risks and benefits, the patient                         was deemed in satisfactory condition to undergo the                         procedure.                        After obtaining  informed consent, the endoscope was                         passed under direct vision. Throughout the procedure,                         the patient's blood pressure, pulse, and oxygen                         saturations were monitored continuously. The Endoscope                          was introduced through the mouth, and advanced to the                         third part of duodenum. The upper GI endoscopy was                         accomplished without difficulty. The patient tolerated                         the procedure well. Findings:      Mucosal changes including longitudinal furrows, circumferential folds       and crepe paper esophagus were found in the entire esophagus. Esophageal       findings were graded using the Eosinophilic Esophagitis Endoscopic       Reference Score (EoE-EREFS) as: Edema Grade 0 Normal (distinct vascular       markings), Rings Grade 1 Mild (subtle circumferential ridges seen on       esophageal distension), Exudates Grade 0 None (no white lesions seen),       Furrows Grade 1 Mild (vertical lines without visible depth) and       Stricture present. Biopsies were obtained from the proximal and distal       esophagus with cold forceps for histology of suspected eosinophilic       esophagitis.      One benign-appearing, intrinsic mild stenosis was found at the       gastroesophageal junction. This stenosis measured 1.6 cm (inner       diameter) x less than one cm (in length). The stenosis was traversed.      Patchy minimal inflammation characterized by erythema was found in the       gastric body and in the gastric antrum. Two biopsies were obtained with       cold forceps for Helicobacter pylori testing in the gastric body.      Multiple small sessile polyps with no bleeding and no stigmata of recent       bleeding were found in the gastric body. Biopsies were taken with a cold       forceps for histology.      The exam of the stomach was otherwise normal.      The examined duodenum was normal. Impression:            - Esophageal mucosal changes secondary to eosinophilic                         esophagitis.                        - Benign-appearing esophageal  stenosis.                        - Gastritis.                         - Multiple gastric polyps. Biopsied.                        - Normal examined duodenum.                        - Biopsies were taken with a cold forceps for                         evaluation of eosinophilic esophagitis.                        - Biopsies performed in the gastric body. Recommendation:        - Await pathology results.                        - Proceed with colonoscopy Procedure Code(s):     --- Professional ---                        [phone], Esophagogastroduodenoscopy, flexible,                         transoral; with biopsy, single or multiple Diagnosis Code(s):     --- Professional ---                        K31.7, Polyp of stomach and duodenum                        K29.70, Gastritis, unspecified, without bleeding                        K22.2, Esophageal obstruction                        K20.0, Eosinophilic esophagitis CPT copyright 2022 American Medical Association. All rights reserved. The codes documented in this report are preliminary and upon coder review may  be revised to meet current compliance requirements. Stanton Kidney MD, MD 03/29/2023 9:24:30 AM This report has been signed electronically. Number of Addenda: 0 Note Initiated On: 03/29/2023 9:00 AM Estimated Blood Loss:  Estimated blood loss was minimal.      [facility]

## 2023-03-29 NOTE — Transfer of Care (Signed)
Immediate Anesthesia Transfer of Care Note  Patient: Manuel Cain  Procedure(s) Performed: COLONOSCOPY ESOPHAGOGASTRODUODENOSCOPY (EGD) BIOPSY  Patient Location: PACU  Anesthesia Type:General  Level of Consciousness: awake and sedated  Airway & Oxygen Therapy: Patient Spontanous Breathing and Patient connected to face mask oxygen  Post-op Assessment: Report given to RN and Post -op Vital signs reviewed and stable  Post vital signs: Reviewed and stable  Last Vitals:  Vitals Value Taken Time  BP    Temp    Pulse    Resp    SpO2      Last Pain:  Vitals:   03/29/23 0816  TempSrc: Temporal         Complications: There were no known notable events for this encounter.

## 2023-03-29 NOTE — Anesthesia Postprocedure Evaluation (Signed)
Anesthesia Post Note  Patient: Manuel Cain  Procedure(s) Performed: COLONOSCOPY ESOPHAGOGASTRODUODENOSCOPY (EGD) BIOPSY  Patient location during evaluation: Endoscopy Anesthesia Type: General Level of consciousness: awake and alert Pain management: pain level controlled Vital Signs Assessment: post-procedure vital signs reviewed and stable Respiratory status: spontaneous breathing, nonlabored ventilation, respiratory function stable and patient connected to nasal cannula oxygen Cardiovascular status: blood pressure returned to baseline and stable Postop Assessment: no apparent nausea or vomiting Anesthetic complications: no   There were no known notable events for this encounter.   Last Vitals:  Vitals:   03/29/23 1004 03/29/23 1013  BP: 121/74 121/72  Pulse: 61 60  Resp: 16 14  Temp:    SpO2: 96% 96%    Last Pain:  Vitals:   03/29/23 1004  TempSrc:   PainSc: 4                  Corinda Gubler

## 2023-03-29 NOTE — Anesthesia Preprocedure Evaluation (Signed)
Anesthesia Evaluation  Patient identified by MRN, date of birth, ID band Patient awake    Reviewed: Allergy & Precautions, NPO status , Patient's Chart, lab work & pertinent test results  History of Anesthesia Complications Negative for: history of anesthetic complications  Airway Mallampati: II  TM Distance: >3 FB Neck ROM: Full    Dental no notable dental hx. (+) Teeth Intact   Pulmonary shortness of breath and with exertion, neg sleep apnea, neg COPD, Patient abstained from smoking.Not current smoker   Pulmonary exam normal breath sounds clear to auscultation       Cardiovascular Exercise Tolerance: Good METShypertension, Pt. on medications (-) CAD and (-) Past MI (-) dysrhythmias  Rhythm:Regular Rate:Normal - Systolic murmurs    Neuro/Psych  Headaches PSYCHIATRIC DISORDERS Anxiety        GI/Hepatic ,GERD  ,,(+)     (-) substance abuse    Endo/Other  neg diabetes  Class 3 obesity  Renal/GU negative Renal ROS     Musculoskeletal   Abdominal  (+) + obese  Peds  Hematology   Anesthesia Other Findings Past Medical History: No date: Anxiety No date: Blood in stool     Comment:  dark pebble like per pt  x4 wks No date: Chest pain     Comment:  on going per pt-x 29mo No date: Chronic ankle pain     Comment:  Right, s/p ZOX(0960) No date: Chronic hip pain     Comment:  right, 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) No date: Curvature of spine No date: Dyspnea No date: Esophagitis No date: Frequent nosebleeds     Comment:  x 2 mo per pt (march 2018) No date: GERD (gastroesophageal reflux disease) 04/2016: Head injury     Comment:    No date: Hemorrhoids No date: Hypertension No date: Kidney stone No date: Memory changes     Comment:  x 1 yr-since 2017 No date: Migraine     Comment:  since 2013 No date: Vasovagal syncope  Reproductive/Obstetrics                             Anesthesia Physical Anesthesia  Plan  ASA: 3  Anesthesia Plan: General   Post-op Pain Management: Minimal or no pain anticipated   Induction: Intravenous  PONV Risk Score and Plan: 2 and Propofol infusion, TIVA and Ondansetron  Airway Management Planned: Nasal Cannula  Additional Equipment: None  Intra-op Plan:   Post-operative Plan:   Informed Consent: I have reviewed the patients History and Physical, chart, labs and discussed the procedure including the risks, benefits and alternatives for the proposed anesthesia with the patient or authorized representative who has indicated his/her understanding and acceptance.     Dental advisory given  Plan Discussed with: CRNA and Surgeon  Anesthesia Plan Comments: (Discussed risks of anesthesia with patient, including possibility of difficulty with spontaneous ventilation under anesthesia necessitating airway intervention, PONV, and rare risks such as cardiac or respiratory or neurological events, and allergic reactions. Discussed the role of CRNA in patient's perioperative care. Patient understands. Patient informed about increased incidence of above perioperative risk due to high BMI. Patient understands.  )       Anesthesia Quick Evaluation

## 2023-03-29 NOTE — Op Note (Signed)
[facility] Gastroenterology Patient Name: Manuel Cain Procedure Date: 03/29/2023 8:59 AM MRN: [id number] Account #: [id number] Date of Birth: 22-Oct-1970 Admit Type: Outpatient Age: 53 Room: [facility] ENDO ROOM 1 Gender: Male Note Status: Finalized Instrument Name: Prentice Docker [id number] Procedure:             Colonoscopy Indications:           High risk colon cancer surveillance: Personal history                         of adenoma with villous component Providers:             Boykin Nearing. Norma Fredrickson MD, MD Referring MD:          Marisue Ivan (Referring MD) Medicines:             Propofol per Anesthesia Complications:         No immediate complications. Estimated blood loss:                         Minimal. Procedure:             Pre-Anesthesia Assessment:                        - The risks and benefits of the procedure and the                         sedation options and risks were discussed with the                         patient. All questions were answered and informed                         consent was obtained.                        - Patient identification and proposed procedure were                         verified prior to the procedure by the nurse. The                         procedure was verified in the procedure room.                        - ASA Grade Assessment: III - A patient with severe                         systemic disease.                        - After reviewing the risks and benefits, the patient                         was deemed in satisfactory condition to undergo the                         procedure.                        After obtaining informed consent, the  colonoscope was                         passed under direct vision. Throughout the procedure,                         the patient's blood pressure, pulse, and oxygen                         saturations were monitored continuously. The                         Colonoscope was  introduced through the anus and                         advanced to the the cecum, identified by appendiceal                         orifice and ileocecal valve. The colonoscopy was                         performed without difficulty. The patient tolerated                         the procedure well. The quality of the bowel                         preparation was good. The ileocecal valve, appendiceal                         orifice, and rectum were photographed. Findings:      The perianal and digital rectal examinations were normal. Pertinent       negatives include normal sphincter tone and no palpable rectal lesions.      Two sessile polyps were found in the rectum and descending colon. The       polyps were 3 to 5 mm in size. These polyps were removed with a jumbo       cold forceps. Resection and retrieval were complete.      A 7 mm polyp was found in the transverse colon. The polyp was sessile.       The polyp was removed with a cold snare. Resection and retrieval were       complete.      The exam was otherwise without abnormality on direct and retroflexion       views. Impression:            - Two 3 to 5 mm polyps in the rectum and in the                         descending colon, removed with a jumbo cold forceps.                         Resected and retrieved.                        - One 7 mm polyp in the transverse colon, removed with  a cold snare. Resected and retrieved.                        - The examination was otherwise normal on direct and                         retroflexion views. Recommendation:        - Await pathology results from EGD, also performed                         today.                        - Patient has a contact number available for                         emergencies. The signs and symptoms of potential                         delayed complications were discussed with the patient.                         Return to normal  activities tomorrow. Written                         discharge instructions were provided to the patient.                        - Resume previous diet.                        - Continue present medications.                        - Await pathology results.                        - Repeat colonoscopy is recommended for surveillance.                         The colonoscopy date will be determined after                         pathology results from today's exam become available                         for review.                        - Follow up with Jacob Moores, PA-C in the GI office.                         [phone]                        - Telephone GI office to schedule appointment in 3                         months.                        -  The findings and recommendations were discussed with                         the patient. Procedure Code(s):     --- Professional ---                        [id number], Colonoscopy, flexible; with removal of                         tumor(s), polyp(s), or other lesion(s) by snare                         technique                        45380, 59, Colonoscopy, flexible; with biopsy, single                         or multiple Diagnosis Code(s):     --- Professional ---                        D12.3, Benign neoplasm of transverse colon (hepatic                         flexure or splenic flexure)                        D12.4, Benign neoplasm of descending colon                        D12.8, Benign neoplasm of rectum                        Z86.010, Personal history of colonic polyps CPT copyright 2022 American Medical Association. All rights reserved. The codes documented in this report are preliminary and upon coder review may  be revised to meet current compliance requirements. Stanton Kidney MD, MD 03/29/2023 9:43:39 AM This report has been signed electronically. Number of Addenda: 0 Note Initiated On: 03/29/2023 8:59 AM Scope Withdrawal  Time: 0 hours 7 minutes 47 seconds  Total Procedure Duration: 0 hours 12 minutes 29 seconds  Estimated Blood Loss:  Estimated blood loss was minimal.      [facility]

## 2023-03-29 NOTE — Interval H&P Note (Signed)
History and Physical Interval Note:  03/29/2023 9:09 AM  [name]  has presented today for surgery, with the diagnosis of Hx of adenomatous colonic polyps (Z86.0101).  The various methods of treatment have been discussed with the patient and family. After consideration of risks, benefits and other options for treatment, the patient has consented to  Procedure(s): COLONOSCOPY (N/A) ESOPHAGOGASTRODUODENOSCOPY (EGD) (N/A) as a surgical intervention.  The patient's history has been reviewed, patient examined, no change in status, stable for surgery.  I have reviewed the patient's chart and labs.  Questions were answered to the patient's satisfaction.     [place], [place]

## 2023-03-29 NOTE — H&P (Signed)
Outpatient short stay form Pre-procedure 03/29/2023 8:59 AM Prajwal Fellner K. Norma Fredrickson, M.D.  Primary Physician: Elpidio Eric MD  Reason for visit: History of adenomatous colon polyps., Eosinophilic esophagitis, history of esophageal stricture, epigastric pain.  History of present illness: Patient with history of esophageal stricture and eosinophilic esophagitis presents with epigastric pain.  Patient also has history of adenomatous colon polyps.  History of tubular adenoma as well as tubulovillous adenoma in 2018 colonoscopy-Dr. Servando Snare. Patient underwent esophageal dilatation with a balloon about 5 years ago and since has not had any recurrent dysphagia.  He denies any hematemesis, melena or hematochezia.  He has mild lower quadrant abdominal discomfort that is relieved with a bowel movement.    Current Facility-Administered Medications:    0.9 %  sodium chloride infusion, , Intravenous, Continuous, [place], Boykin Nearing, MD, Last Rate: 20 mL/hr at 03/29/23 8657, Continued from Pre-op at 03/29/23 0858  Medications Prior to Admission  Medication Sig Dispense Refill Last Dose/Taking   amLODipine (NORVASC) 10 MG tablet Take 10 mg by mouth daily.   03/28/2023   pantoprazole (PROTONIX) 20 MG tablet Take 20 mg by mouth 2 (two) times daily before a meal.   03/28/2023   Fluticasone-Umeclidin-Vilant (TRELEGY ELLIPTA) 200-62.5-25 MCG/ACT AEPB Inhale 1 puff into the lungs daily. (Patient not taking: Reported on 03/22/2023)   Not Taking   Multiple Vitamins-Minerals (CENTRUM SILVER 50+MEN PO) Take 1 tablet by mouth daily.         Allergies  Allergen Reactions   Valium [Diazepam] Other (See Comments)    convulsions   Sulfamethoxazole-Trimethoprim Other (See Comments)    States blood pressure elevated.     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    Physical Exam  Gen: Alert, oriented. Appears stated age.  HEENT: [place]/AT. PERRLA. Lungs: CTA, no wheezes. CV: RR nl S1, S2. Abd: soft, benign, no masses. BS+ Ext: No edema. Pulses 2+    Planned procedures: Proceed with EGD and colonoscopy. The patient understands the nature of the planned procedure, indications, risks, alternatives and potential complications including but not limited to bleeding, infection, perforation, damage to internal organs and possible oversedation/side effects from anesthesia. The patient agrees and gives consent to proceed.  Please refer to procedure notes for findings, recommendations and patient disposition/instructions.     Syniyah Bourne K. Norma Fredrickson, M.D. Gastroenterology 03/29/2023  8:59 AM

## 2023-03-29 NOTE — Interval H&P Note (Signed)
History and Physical Interval Note:  03/29/2023 9:08 AM  Manuel Cain  has presented today for surgery, with the diagnosis of Hx of adenomatous colonic polyps (Z86.0101).  The various methods of treatment have been discussed with the patient and family. After consideration of risks, benefits and other options for treatment, the patient has consented to  Procedure(s): COLONOSCOPY (N/A) ESOPHAGOGASTRODUODENOSCOPY (EGD) (N/A) as a surgical intervention.  The patient's history has been reviewed, patient examined, no change in status, stable for surgery.  I have reviewed the patient's chart and labs.  Questions were answered to the patient's satisfaction.     [place], [place]

## 2023-03-30 ENCOUNTER — Encounter: Payer: Self-pay | Admitting: Internal Medicine

## 2023-04-01 LAB — SURGICAL PATHOLOGY

## 2023-04-06 ENCOUNTER — Ambulatory Visit: Payer: 59 | Admitting: Podiatry

## 2023-04-06 ENCOUNTER — Ambulatory Visit (INDEPENDENT_AMBULATORY_CARE_PROVIDER_SITE_OTHER): Payer: 59

## 2023-04-06 ENCOUNTER — Encounter: Payer: Self-pay | Admitting: Podiatry

## 2023-04-06 DIAGNOSIS — M7661 Achilles tendinitis, right leg: Secondary | ICD-10-CM

## 2023-04-06 DIAGNOSIS — M7752 Other enthesopathy of left foot: Secondary | ICD-10-CM

## 2023-04-06 NOTE — Progress Notes (Signed)
 He presents today complaining primarily of his Achilles tendon on his right foot.  States that approximately a year ago he went to [facility] after stepping wrong off of a curb and primarily landing with his forefoot instead of his entire foot.  He states that they put him in a boot and it seemed to get better and now it has started to become tender once again as he points to the mid substance of his Achilles right.  Left f is still painful particularly around the toes with numbness and fullness under the bottom of these 2 toes as he points to the second and third metatarsal phalangeal joint areas.  He currently wears a metatarsal pad to help splay his mets and to hold the painful metatarsals off the shoe some.  Objective: Vital signs are stable alert oriented x 3.  Pulses are palpable.  Capillary fill time is immediate neurologic system is intact Deetjen reflexes are intact muscle strength is normal and symmetrical.  He has tenderness on plantarflexion range of motion of the second and third metatarsophalangeal joints and tenderness on palpation of these joints.  He also has tenderness on palpation of the mid substance of the Achilles with nodularity nonpulsatile nature right Achilles.  Similar findings are noticed in the left Achilles midsubstance area.  Radiographs taken today demonstrate osseously mature individual good bone mineralization retrocalcaneal/posterior superior calcaneal heel spur secondary to traction deformities most likely of the Achilles and calcaneus.  Assessment: Metatarsalgia and capsulitis of the second and third metatarsophalangeal joints of the left foot.  Probably compounded by a tight Achilles left.  Achilles tendinopathy right over left.  Plan: Discussed etiology pathology conservative versus surgical therapies at this point recommended physical therapy at [facility] outpatient physical therapy on [facility] in [place] and then I also recommended he follow-up with [facility]  for orthotics for met pad a cut out to float the second and third metatarsal phalangeal joints left and also a possible 16th or eighth of an inch raise bilateral heels.

## 2023-04-08 DIAGNOSIS — Z860101 Personal history of adenomatous and serrated colon polyps: Secondary | ICD-10-CM | POA: Diagnosis not present

## 2023-04-08 DIAGNOSIS — K591 Functional diarrhea: Secondary | ICD-10-CM | POA: Diagnosis not present

## 2023-04-08 DIAGNOSIS — K30 Functional dyspepsia: Secondary | ICD-10-CM | POA: Diagnosis not present

## 2023-04-08 DIAGNOSIS — R1084 Generalized abdominal pain: Secondary | ICD-10-CM | POA: Diagnosis not present

## 2023-04-08 DIAGNOSIS — Z8719 Personal history of other diseases of the digestive system: Secondary | ICD-10-CM | POA: Diagnosis not present

## 2023-04-08 DIAGNOSIS — K200 Eosinophilic esophagitis: Secondary | ICD-10-CM | POA: Diagnosis not present

## 2023-04-08 DIAGNOSIS — R140 Abdominal distension (gaseous): Secondary | ICD-10-CM | POA: Diagnosis not present

## 2023-04-19 DIAGNOSIS — I890 Lymphedema, not elsewhere classified: Secondary | ICD-10-CM | POA: Diagnosis not present

## 2023-04-19 DIAGNOSIS — E538 Deficiency of other specified B group vitamins: Secondary | ICD-10-CM | POA: Diagnosis not present

## 2023-04-19 DIAGNOSIS — M5416 Radiculopathy, lumbar region: Secondary | ICD-10-CM | POA: Diagnosis not present

## 2023-04-19 DIAGNOSIS — Z6841 Body Mass Index (BMI) 40.0 and over, adult: Secondary | ICD-10-CM | POA: Diagnosis not present

## 2023-04-19 DIAGNOSIS — I10 Essential (primary) hypertension: Secondary | ICD-10-CM | POA: Diagnosis not present

## 2023-04-19 DIAGNOSIS — R202 Paresthesia of skin: Secondary | ICD-10-CM | POA: Diagnosis not present

## 2023-04-20 DIAGNOSIS — R200 Anesthesia of skin: Secondary | ICD-10-CM | POA: Diagnosis not present

## 2023-04-20 DIAGNOSIS — G4733 Obstructive sleep apnea (adult) (pediatric): Secondary | ICD-10-CM | POA: Diagnosis not present

## 2023-04-22 DIAGNOSIS — R197 Diarrhea, unspecified: Secondary | ICD-10-CM | POA: Diagnosis not present

## 2023-04-22 DIAGNOSIS — Z114 Encounter for screening for human immunodeficiency virus [HIV]: Secondary | ICD-10-CM | POA: Diagnosis not present

## 2023-04-22 DIAGNOSIS — R200 Anesthesia of skin: Secondary | ICD-10-CM | POA: Diagnosis not present

## 2023-04-22 DIAGNOSIS — R140 Abdominal distension (gaseous): Secondary | ICD-10-CM | POA: Diagnosis not present

## 2023-04-22 DIAGNOSIS — R109 Unspecified abdominal pain: Secondary | ICD-10-CM | POA: Diagnosis not present

## 2023-04-22 DIAGNOSIS — R110 Nausea: Secondary | ICD-10-CM | POA: Diagnosis not present

## 2023-04-26 DIAGNOSIS — R200 Anesthesia of skin: Secondary | ICD-10-CM | POA: Diagnosis not present

## 2023-06-03 ENCOUNTER — Other Ambulatory Visit: Payer: 59

## 2023-06-21 DIAGNOSIS — I10 Essential (primary) hypertension: Secondary | ICD-10-CM | POA: Diagnosis not present

## 2023-06-21 DIAGNOSIS — D72829 Elevated white blood cell count, unspecified: Secondary | ICD-10-CM | POA: Diagnosis not present

## 2023-06-22 DIAGNOSIS — M9902 Segmental and somatic dysfunction of thoracic region: Secondary | ICD-10-CM | POA: Diagnosis not present

## 2023-06-22 DIAGNOSIS — M5413 Radiculopathy, cervicothoracic region: Secondary | ICD-10-CM | POA: Diagnosis not present

## 2023-06-22 DIAGNOSIS — M5417 Radiculopathy, lumbosacral region: Secondary | ICD-10-CM | POA: Diagnosis not present

## 2023-06-22 DIAGNOSIS — M9904 Segmental and somatic dysfunction of sacral region: Secondary | ICD-10-CM | POA: Diagnosis not present

## 2023-06-22 DIAGNOSIS — M9901 Segmental and somatic dysfunction of cervical region: Secondary | ICD-10-CM | POA: Diagnosis not present

## 2023-06-22 DIAGNOSIS — M419 Scoliosis, unspecified: Secondary | ICD-10-CM | POA: Diagnosis not present

## 2023-06-22 DIAGNOSIS — M4326 Fusion of spine, lumbar region: Secondary | ICD-10-CM | POA: Diagnosis not present

## 2023-06-22 DIAGNOSIS — M9905 Segmental and somatic dysfunction of pelvic region: Secondary | ICD-10-CM | POA: Diagnosis not present

## 2023-06-22 DIAGNOSIS — M9903 Segmental and somatic dysfunction of lumbar region: Secondary | ICD-10-CM | POA: Diagnosis not present

## 2023-06-28 DIAGNOSIS — I890 Lymphedema, not elsewhere classified: Secondary | ICD-10-CM | POA: Diagnosis not present

## 2023-06-28 DIAGNOSIS — E559 Vitamin D deficiency, unspecified: Secondary | ICD-10-CM | POA: Diagnosis not present

## 2023-06-28 DIAGNOSIS — D72829 Elevated white blood cell count, unspecified: Secondary | ICD-10-CM | POA: Diagnosis not present

## 2023-06-28 DIAGNOSIS — Z125 Encounter for screening for malignant neoplasm of prostate: Secondary | ICD-10-CM | POA: Diagnosis not present

## 2023-06-28 DIAGNOSIS — I10 Essential (primary) hypertension: Secondary | ICD-10-CM | POA: Diagnosis not present

## 2023-06-28 DIAGNOSIS — Z6841 Body Mass Index (BMI) 40.0 and over, adult: Secondary | ICD-10-CM | POA: Diagnosis not present

## 2023-07-05 ENCOUNTER — Other Ambulatory Visit: Payer: Self-pay | Admitting: Urology

## 2023-07-05 DIAGNOSIS — N200 Calculus of kidney: Secondary | ICD-10-CM

## 2023-07-06 ENCOUNTER — Encounter: Payer: Self-pay | Admitting: Urology

## 2023-07-06 ENCOUNTER — Ambulatory Visit
Admission: RE | Admit: 2023-07-06 | Discharge: 2023-07-06 | Disposition: A | Discharge status: Home / Self Care | Attending: Urology | Admitting: Urology

## 2023-07-06 ENCOUNTER — Ambulatory Visit
Admission: RE | Admit: 2023-07-06 | Discharge: 2023-07-06 | Disposition: A | Discharge status: Home / Self Care | Source: Ambulatory Visit | Attending: Urology | Admitting: Urology

## 2023-07-06 ENCOUNTER — Ambulatory Visit: Admitting: Urology

## 2023-07-06 VITALS — BP 146/87 | HR 78 | Ht 69.0 in | Wt 293.0 lb

## 2023-07-06 DIAGNOSIS — R109 Unspecified abdominal pain: Secondary | ICD-10-CM

## 2023-07-06 DIAGNOSIS — N200 Calculus of kidney: Secondary | ICD-10-CM

## 2023-07-06 DIAGNOSIS — N390 Urinary tract infection, site not specified: Secondary | ICD-10-CM

## 2023-07-06 DIAGNOSIS — R10A3 Flank pain, bilateral: Secondary | ICD-10-CM

## 2023-07-06 DIAGNOSIS — Z981 Arthrodesis status: Secondary | ICD-10-CM | POA: Diagnosis not present

## 2023-07-06 LAB — URINALYSIS, COMPLETE
Bilirubin, UA: NEGATIVE
Glucose, UA: NEGATIVE
Ketones, UA: NEGATIVE
Leukocytes,UA: NEGATIVE
Nitrite, UA: NEGATIVE
Protein,UA: NEGATIVE
RBC, UA: NEGATIVE
Specific Gravity, UA: 1.01 (ref 1.005–1.030)
Urobilinogen, Ur: 0.2 mg/dL (ref 0.2–1.0)
pH, UA: 6 (ref 5.0–7.5)

## 2023-07-06 LAB — MICROSCOPIC EXAMINATION

## 2023-07-06 MED ORDER — TAMSULOSIN HCL 0.4 MG PO CAPS
0.4000 mg | ORAL_CAPSULE | Freq: Every day | ORAL | 3 refills | Status: AC
Start: 2023-07-06 — End: ?

## 2023-07-06 MED ORDER — HYDROCODONE-ACETAMINOPHEN 5-325 MG PO TABS: 1.0000 | ORAL_TABLET | Freq: Four times a day (QID) | ORAL | 0 refills | Status: DC | PRN

## 2023-07-06 NOTE — Progress Notes (Signed)
 07/06/2023 9:29 PM   Manuel Cain [birth year] [id number]  Referring provider: Monique Ano, MD [phone] [facility] [address] [facility] [place],  [place] [address]  Urological history: 1.  Nephrolithiasis - Stone composition of calcium  oxalate monohydrate 50%, calcium  oxalate dihydrate 30% and calcium  phosphate 20% - Right ESWL (2023)   Chief Complaint  Patient presents with   Nephrolithiasis   HPI: Manuel Cain is a 53 y.o. man who presents today for left and right flank pain with his wife, Manuel Cain.   Previous records reviewed.   Urinalysis the color show clear, specific gravity 1.010, pH 6.0, 0-5 WBCs, 0-2 RBCs, 0-10 epithelial cells, mucus is present and a few bacteria.  KUB 8 mm left renal stone  He has been having some dull left-sided flank pain off-and-on for the last several weeks, but for the last 2 weeks he has been having more intense right sided flank pain that radiates to the right upper quadrant into the right lower quadrant.  He is not noticed anything that seems to make the pain worse specifically it does not get worse with eating or movement.  The pain can become so intense it wakes him up at night.  He states it feels like previous stone episodes.  He had some leftover tamsulosin  on hand so he started to take that medication.  He has is been having frequency, urgency and pain at the tip of his penis.  Patient denies any modifying or aggravating factors.  Patient denies any recent UTI's, gross hematuria or dysuria. Patient denies any fevers, chills, nausea or vomiting.    PMH: Past Medical History:  Diagnosis Date   Anxiety    Blood in stool    dark pebble like per pt  x4 wks   Chest pain    on going per pt-x 24mo   Chronic ankle pain    Right, s/p NWG(9562)   Chronic hip pain    right, s/p ZHY(8657)   Curvature of spine    Dyspnea    Esophagitis    Frequent nosebleeds    x 2 mo per pt (march 2018)   GERD (gastroesophageal reflux  disease)    Head injury 04/2016       Hemorrhoids    Hypertension    Kidney stone    Memory changes    x 1 yr-since 2017   Migraine    since 2013   Vasovagal syncope     Surgical History: Past Surgical History:  Procedure Laterality Date   BIOPSY  03/29/2023   Procedure: BIOPSY;  Surgeon: Toledo, Alphonsus Jeans, MD;  Location: [facility];  Service: Gastroenterology;;   CARDIAC CATHETERIZATION     CHOLECYSTECTOMY N/A 08/23/2018   Procedure: LAPAROSCOPIC CHOLECYSTECTOMY;  Surgeon: Eldred Grego, MD;  Location: [facility];  Service: General;  Laterality: N/A;   COLONOSCOPY N/A 03/29/2023   Procedure: COLONOSCOPY;  Surgeon: Toledo, Alphonsus Jeans, MD;  Location: [facility];  Service: Gastroenterology;  Laterality: N/A;   COLONOSCOPY WITH PROPOFOL  N/A 12/16/2016   Procedure: COLONOSCOPY WITH PROPOFOL ;  Surgeon: Marnee Sink, MD;  Location: [facility];  Service: Endoscopy;  Laterality: N/A;   ESOPHAGOGASTRODUODENOSCOPY N/A 03/29/2023   Procedure: ESOPHAGOGASTRODUODENOSCOPY (EGD);  Surgeon: Toledo, Alphonsus Jeans, MD;  Location: [facility];  Service: Gastroenterology;  Laterality: N/A;   ESOPHAGOGASTRODUODENOSCOPY (EGD) WITH PROPOFOL  N/A 12/16/2016   Procedure: ESOPHAGOGASTRODUODENOSCOPY (EGD) WITH PROPOFOL ;  Surgeon: Marnee Sink, MD;  Location: [facility];  Service: Endoscopy;  Laterality: N/A;   ESOPHAGOGASTRODUODENOSCOPY (EGD)  WITH PROPOFOL  N/A 08/09/2018   Procedure: ESOPHAGOGASTRODUODENOSCOPY (EGD) WITH PROPOFOL ;  Surgeon: Toledo, Alphonsus Jeans, MD;  Location: [facility];  Service: Gastroenterology;  Laterality: N/A;   EXTRACORPOREAL SHOCK WAVE LITHOTRIPSY Right 07/09/2021   Procedure: EXTRACORPOREAL SHOCK WAVE LITHOTRIPSY (ESWL);  Surgeon: Geraline Knapp, MD;  Location: [facility];  Service: Urology;  Laterality: Right;   KNEE SURGERY Left    POLYPECTOMY  12/16/2016   Procedure: POLYPECTOMY;  Surgeon: Marnee Sink, MD;  Location: [facility];  Service:  Endoscopy;;   POLYPECTOMY  03/29/2023   Procedure: POLYPECTOMY;  Surgeon: Toledo, Teodoro K, MD;  Location: [facility];  Service: Gastroenterology;;   vastectomy  1999    Home Medications:  Allergies as of 07/06/2023       Reactions   Valium [diazepam] Other (See Comments)   convulsions   Sulfamethoxazole-trimethoprim Other (See Comments)   States blood pressure elevated.        Medication List        Accurate as of Jul 06, 2023 11:59 PM. If you have any questions, ask your nurse or doctor.          STOP taking these medications    sucralfate 1 g tablet Commonly known as: CARAFATE Stopped by: Kostantinos Tallman       TAKE these medications    amLODipine 10 MG tablet Commonly known as: NORVASC Take 10 mg by mouth daily.   CENTRUM SILVER 50+MEN PO Take 1 tablet by mouth daily.   HYDROcodone -acetaminophen  5-325 MG tablet Commonly known as: NORCO/VICODIN Take 1 tablet by mouth every 6 (six) hours as needed for moderate pain (pain score 4-6). Started by: Matilde Son   pantoprazole  20 MG tablet Commonly known as: PROTONIX  Take 20 mg by mouth 2 (two) times daily before a meal.   tamsulosin  0.4 MG Caps capsule Commonly known as: FLOMAX  Take 1 capsule (0.4 mg total) by mouth daily. Started by: Matilde Son        Allergies:  Allergies  Allergen Reactions   Valium [Diazepam] Other (See Comments)    convulsions   Sulfamethoxazole-Trimethoprim Other (See Comments)    States blood pressure elevated.    Family History: Family History  Problem Relation Age of Onset   Hypertension Mother    Breast cancer Mother    CAD Father    Heart attack Father    Bladder Cancer Brother     Social History:  reports that he has never smoked. He has never been exposed to tobacco smoke. He has never used smokeless tobacco. He reports that he does not drink alcohol and does not use drugs.  ROS: Pertinent ROS in HPI  Physical Exam: BP (!) 146/87   Pulse 78    Ht 5\' 9"  (1.753 m)   Wt 293 lb (132.9 kg)   BMI 43.27 kg/m   Constitutional:  Well nourished. Alert and oriented, No acute distress. HEENT: [place] AT, moist mucus membranes.  Trachea midline Cardiovascular: No clubbing, cyanosis, or edema. Respiratory: Normal respiratory effort, no increased work of breathing. Neurologic: Grossly intact, no focal deficits, moving all 4 extremities. Psychiatric: Normal mood and affect.  Laboratory Data: Basic Metabolic Panel (BMP) Order: [id number] Component Ref Range & Units 2 wk ago  Glucose 70 - 110 mg/dL 92  Sodium 528 - 413 mmol/L 141  Potassium 3.6 - 5.1 mmol/L 4.2  Chloride 97 - 109 mmol/L 104  Carbon Dioxide (CO2) 22.0 - 32.0 mmol/L 29.6  Calcium  8.7 - 10.3 mg/dL 9.5  Urea Nitrogen (BUN)  7 - 25 mg/dL 13  Creatinine 0.7 - 1.3 mg/dL 0.9  Glomerular Filtration Rate (eGFR) >60 mL/min/1.73sq m 101  Comment: CKD-EPI (2021) does not include patient's race in the calculation of eGFR.  Monitoring changes of plasma creatinine and eGFR over time is useful for monitoring kidney function.  Interpretive Ranges for eGFR (CKD-EPI 2021):  eGFR:       >60 mL/min/1.73 sq. m - Normal eGFR:       30-59 mL/min/1.73 sq. m - Moderately Decreased eGFR:       15-29 mL/min/1.73 sq. m  - Severely Decreased eGFR:       < 15 mL/min/1.73 sq. m  - Kidney Failure   Note: These eGFR calculations do not apply in acute situations when eGFR is changing rapidly or patients on dialysis.  BUN/Crea Ratio 6.0 - 20.0 14.4  Anion Gap w/K 6.0 - 16.0 11.6  Resulting Agency [facility]   Specimen Collected: 06/21/23 08:33   Performed by: [facility] Last Resulted: 06/21/23 11:01  Received From: [facility]  Result Received: 06/30/23 10:42   ontains abnormal data CBC w/auto Differential (5 Part) Order: [id number] Component Ref Range & Units 2 wk ago  WBC (White Blood Cell Count) 4.1 - 10.2 10^3/uL 13.4 High   Comment:  Smear review agrees with analyzer results Variant lymphocytes present  RBC (Red Blood Cell Count) 4.69 - 6.13 10^6/uL 5.45  Hemoglobin 14.1 - 18.1 gm/dL 69.6  Hematocrit 78.9 - 52.0 % 44  MCV (Mean Corpuscular Volume) 80.0 - 100.0 fl 80.7  MCH (Mean Corpuscular Hemoglobin) 27.0 - 31.2 pg 26.8 Low   MCHC (Mean Corpuscular Hemoglobin Concentration) 32.0 - 36.0 gm/dL 38.1  Platelet Count 017 - 450 10^3/uL 394  RDW-CV (Red Cell Distribution Width) 11.6 - 14.8 % 13.7  MPV (Mean Platelet Volume) 9.4 - 12.4 fl 9.5  Neutrophils 1.50 - 7.80 10^3/uL 7.6  Lymphocytes 1.00 - 3.60 10^3/uL 4.17 High   Monocytes 0.00 - 1.50 10^3/uL 0.74  Eosinophils 0.00 - 0.55 10^3/uL 0.73 High   Basophils 0.00 - 0.09 10^3/uL 0.12 High   Neutrophil % 32.0 - 70.0 % 56.6  Lymphocyte % 10.0 - 50.0 % 31  Monocyte % 4.0 - 13.0 % 5.5  Eosinophil % 1.0 - 5.0 % 5.4 High   Basophil% 0.0 - 2.0 % 0.9  Immature Granulocyte % <=0.7 % 0.6  Immature Granulocyte Count <=0.06 10^3/L 0.08 High   Resulting Agency [facility]   Specimen Collected: 06/21/23 08:33   Performed by: [facility] Last Resulted: 06/21/23 10:37  Received From: [facility]  Result Received: 06/30/23 10:42    Hemoglobin A1C Order: [id number] Component Ref Range & Units 2 mo ago  Hemoglobin A1C 4.2 - 5.6 % 5.3  Average Blood Glucose (Calc) mg/dL 782  Resulting Agency [facility]  Narrative Performed by [facility] Normal Range:    4.2 - 5.6% Increased Risk:  5.7 - 6.4% Diabetes:        >= 6.5% Glycemic Control for adults with diabetes:  <7%    Specimen Collected: 04/22/23 14:18   Performed by: [facility] Last Resulted: 04/22/23 16:51  Received From: [facility]  Result Received: 06/30/23 10:42   Urinalysis See EPIC and HPI  I have reviewed the labs.   Pertinent Imaging: CLINICAL DATA:  Flank pain.    EXAM: ABDOMEN -  1 VIEW   COMPARISON:  Subsequent abdominopelvic CT, available at time of radiograph interpretation.   FINDINGS: 6-7 mm stone projects over the lower pole of the left kidney. Calcifications in the left pelvis correspond to phleboliths on CT. Visual right urolithiasis. Normal bowel gas pattern with small to moderate colonic stool burden. Cholecystectomy clips in the right upper quadrant. Lower lumbar fusion hardware.   IMPRESSION: A 6-7 mm stone projects over the lower pole of the left kidney.     Electronically Signed   By: Chadwick Colonel M.D.   On: 07/07/2023 11:10 I have independently reviewed the films.  See HPI.    Assessment & Plan:    1. Nephrolithiasis (Primary) - Urinalysis, Complete - CULTURE, URINE COMPREHENSIVE - KUB shows a left renal stone which would not explain his symptoms of right flank pain > left flank pain   2. Urinary tract infection without hematuria, site unspecified - Urinalysis, Complete, benign  - CULTURE, URINE COMPREHENSIVE as he is having frequency and pain at the tip of his penis  - will not prescribe an antibiotic at this time as I am not suspicious for infection  3. Bilateral flank pain - no findings for right sided nephrolithiasis on KUB, patient states the pain is similar to previous episodes of renal colic - Will pursue CT renal stone study for further evaluation of his symptoms - I have given him a refill on the tamsulosin  0.4 mg daily and a prescription for Vicodin 5/325, #10 , 1 every 6 hours as needed for pain  Return for CT renal stone study tommorrow, will call w/ results.  These notes generated with voice recognition software. I apologize for typographical errors.  Briant Camper  [facility] [address]  [address] [place], [place] [address] [phone]

## 2023-07-07 ENCOUNTER — Ambulatory Visit

## 2023-07-07 ENCOUNTER — Ambulatory Visit
Admission: RE | Admit: 2023-07-07 | Discharge: 2023-07-07 | Disposition: A | Discharge status: Home / Self Care | Source: Ambulatory Visit | Attending: Urology | Admitting: Urology

## 2023-07-07 DIAGNOSIS — K4020 Bilateral inguinal hernia, without obstruction or gangrene, not specified as recurrent: Secondary | ICD-10-CM | POA: Diagnosis not present

## 2023-07-07 DIAGNOSIS — N200 Calculus of kidney: Secondary | ICD-10-CM | POA: Diagnosis not present

## 2023-07-07 DIAGNOSIS — K760 Fatty (change of) liver, not elsewhere classified: Secondary | ICD-10-CM | POA: Diagnosis not present

## 2023-07-07 DIAGNOSIS — R109 Unspecified abdominal pain: Secondary | ICD-10-CM | POA: Insufficient documentation

## 2023-07-07 NOTE — Progress Notes (Signed)
 Orthotics   Patient was present and evaluated for Custom molded foot orthotics. Patient will benefit from CFO's to provide total contact to BIL MLA's helping to balance and distribute body weight more evenly across BIL feet helping to reduce plantar pressure and pain. Orthotic will also encourage FF / RF alignment  Patient was scanned today and will return for fitting upon receipt

## 2023-07-08 DIAGNOSIS — S29012A Strain of muscle and tendon of back wall of thorax, initial encounter: Secondary | ICD-10-CM | POA: Diagnosis not present

## 2023-07-09 LAB — CULTURE, URINE COMPREHENSIVE

## 2023-08-17 ENCOUNTER — Other Ambulatory Visit

## 2023-08-17 NOTE — Progress Notes (Signed)
 Scans never went through to Manuel Cain I tried to call patient to cancel appt left message re: appt I had the orthotics rushed and should be here in a week or so -TG

## 2023-08-29 ENCOUNTER — Other Ambulatory Visit

## 2023-08-31 DIAGNOSIS — R3121 Asymptomatic microscopic hematuria: Secondary | ICD-10-CM | POA: Diagnosis not present

## 2023-08-31 DIAGNOSIS — R0781 Pleurodynia: Secondary | ICD-10-CM | POA: Diagnosis not present

## 2023-08-31 DIAGNOSIS — D72829 Elevated white blood cell count, unspecified: Secondary | ICD-10-CM | POA: Diagnosis not present

## 2023-08-31 DIAGNOSIS — K5900 Constipation, unspecified: Secondary | ICD-10-CM | POA: Diagnosis not present

## 2023-08-31 DIAGNOSIS — R1011 Right upper quadrant pain: Secondary | ICD-10-CM | POA: Diagnosis not present

## 2023-08-31 DIAGNOSIS — N200 Calculus of kidney: Secondary | ICD-10-CM | POA: Diagnosis not present

## 2023-09-01 ENCOUNTER — Ambulatory Visit

## 2023-09-01 DIAGNOSIS — M7661 Achilles tendinitis, right leg: Secondary | ICD-10-CM

## 2023-09-01 DIAGNOSIS — M7752 Other enthesopathy of left foot: Secondary | ICD-10-CM

## 2023-09-01 NOTE — Progress Notes (Signed)
 Orthotics were dispensed and functioning well no acute complaints.  Break-in period was discussed extensively

## 2023-09-06 ENCOUNTER — Other Ambulatory Visit: Payer: Self-pay | Admitting: Gastroenterology

## 2023-09-06 DIAGNOSIS — R1011 Right upper quadrant pain: Secondary | ICD-10-CM

## 2023-09-08 ENCOUNTER — Ambulatory Visit
Admission: RE | Admit: 2023-09-08 | Discharge: 2023-09-08 | Disposition: A | Discharge status: Home / Self Care | Source: Ambulatory Visit | Attending: Gastroenterology | Admitting: Gastroenterology

## 2023-09-08 DIAGNOSIS — Z9049 Acquired absence of other specified parts of digestive tract: Secondary | ICD-10-CM | POA: Diagnosis not present

## 2023-09-08 DIAGNOSIS — R1011 Right upper quadrant pain: Secondary | ICD-10-CM | POA: Insufficient documentation

## 2023-09-08 DIAGNOSIS — K760 Fatty (change of) liver, not elsewhere classified: Secondary | ICD-10-CM | POA: Diagnosis not present

## 2023-09-16 DIAGNOSIS — I10 Essential (primary) hypertension: Secondary | ICD-10-CM | POA: Diagnosis not present

## 2023-09-16 DIAGNOSIS — R0782 Intercostal pain: Secondary | ICD-10-CM | POA: Diagnosis not present

## 2023-12-29 ENCOUNTER — Emergency Department
Admission: EM | Admit: 2023-12-29 | Discharge: 2023-12-29 | Disposition: A | Discharge status: Home / Self Care | Attending: Emergency Medicine | Admitting: Emergency Medicine

## 2023-12-29 ENCOUNTER — Other Ambulatory Visit: Payer: Self-pay

## 2023-12-29 ENCOUNTER — Emergency Department

## 2023-12-29 DIAGNOSIS — I10 Essential (primary) hypertension: Secondary | ICD-10-CM | POA: Diagnosis not present

## 2023-12-29 DIAGNOSIS — N132 Hydronephrosis with renal and ureteral calculous obstruction: Secondary | ICD-10-CM | POA: Diagnosis not present

## 2023-12-29 DIAGNOSIS — D72829 Elevated white blood cell count, unspecified: Secondary | ICD-10-CM | POA: Diagnosis not present

## 2023-12-29 DIAGNOSIS — N200 Calculus of kidney: Secondary | ICD-10-CM

## 2023-12-29 DIAGNOSIS — R10A2 Flank pain, left side: Secondary | ICD-10-CM | POA: Diagnosis present

## 2023-12-29 LAB — CBC
HCT: 45 % (ref 39.0–52.0)
Hemoglobin: 15 g/dL (ref 13.0–17.0)
MCH: 26.7 pg (ref 26.0–34.0)
MCHC: 33.3 g/dL (ref 30.0–36.0)
MCV: 80.1 fL (ref 80.0–100.0)
Platelets: 434 K/uL — ABNORMAL HIGH (ref 150–400)
RBC: 5.62 MIL/uL (ref 4.22–5.81)
RDW: 13.5 % (ref 11.5–15.5)
WBC: 20 K/uL — ABNORMAL HIGH (ref 4.0–10.5)
nRBC: 0 % (ref 0.0–0.2)

## 2023-12-29 LAB — URINALYSIS, ROUTINE W REFLEX MICROSCOPIC
Bilirubin Urine: NEGATIVE
Glucose, UA: NEGATIVE mg/dL
Hgb urine dipstick: NEGATIVE
Ketones, ur: NEGATIVE mg/dL
Leukocytes,Ua: NEGATIVE
Nitrite: NEGATIVE
Protein, ur: NEGATIVE mg/dL
Specific Gravity, Urine: 1.011 (ref 1.005–1.030)
pH: 7 (ref 5.0–8.0)

## 2023-12-29 LAB — BASIC METABOLIC PANEL WITH GFR
Anion gap: 13 (ref 5–15)
BUN: 19 mg/dL (ref 6–20)
CO2: 24 mmol/L (ref 22–32)
Calcium: 9.7 mg/dL (ref 8.9–10.3)
Chloride: 102 mmol/L (ref 98–111)
Creatinine, Ser: 1.3 mg/dL — ABNORMAL HIGH (ref 0.61–1.24)
GFR, Estimated: >60 mL/min (ref >60)
Glucose, Bld: 116 mg/dL — ABNORMAL HIGH (ref 70–99)
Potassium: 3.6 mmol/L (ref 3.5–5.1)
Sodium: 139 mmol/L (ref 135–145)

## 2023-12-29 MED ORDER — CEPHALEXIN 500 MG PO CAPS: 500.0000 mg | ORAL_CAPSULE | Freq: Three times a day (TID) | ORAL | 0 refills | Status: AC

## 2023-12-29 MED ORDER — ONDANSETRON HCL 4 MG PO TABS: 4.0000 mg | ORAL_TABLET | Freq: Three times a day (TID) | ORAL | 0 refills | Status: AC | PRN

## 2023-12-29 MED ORDER — SODIUM CHLORIDE 0.9 % IV BOLUS
1000.0000 mL | Freq: Once | INTRAVENOUS | Status: AC
  Administered 2023-12-29: 1000 mL via INTRAVENOUS

## 2023-12-29 MED ORDER — SODIUM CHLORIDE 0.9 % IV SOLN
1.0000 g | Freq: Once | INTRAVENOUS | Status: AC
  Administered 2023-12-29: 1 g via INTRAVENOUS
  Filled 2023-12-29: qty 10

## 2023-12-29 MED ORDER — OXYCODONE-ACETAMINOPHEN 5-325 MG PO TABS: 1.0000 | ORAL_TABLET | Freq: Four times a day (QID) | ORAL | 0 refills | Status: DC | PRN

## 2023-12-29 MED ORDER — ONDANSETRON HCL 4 MG/2ML IJ SOLN
4.0000 mg | Freq: Once | INTRAMUSCULAR | Status: AC
  Administered 2023-12-29: 4 mg via INTRAVENOUS
  Filled 2023-12-29: qty 2

## 2023-12-29 MED ORDER — FENTANYL CITRATE (PF) 50 MCG/ML IJ SOSY
50.0000 ug | PREFILLED_SYRINGE | Freq: Once | INTRAMUSCULAR | Status: AC
  Administered 2023-12-29: 50 ug via INTRAVENOUS
  Filled 2023-12-29: qty 1

## 2023-12-29 NOTE — Discharge Instructions (Signed)
 Please follow-up with urology.  Return to the emergency department if symptoms change or worsen.

## 2023-12-29 NOTE — ED Provider Notes (Signed)
 [facility] Provider Note    Event Date/Time   First MD Initiated Contact with Patient 12/29/23 1948     (approximate)   History   Flank Pain   HPI  Manuel Cain is a 53 y.o. male with history of kidney stone, hypertension, GERD hyperlipidemia and as listed in EMR presents to the emergency department for treatment and evaluation of left flank pain that radiates into the left groin.  Pain is similar to previous kidney stone.    Physical Exam    Vitals:   12/29/23 1851 12/29/23 2159  BP: (!) 169/105 (!) 150/88  Pulse: 89 79  Resp: 18 18  Temp: 98.3 F (36.8 C)   SpO2: 96% 98%    General: Awake, no distress.  CV:  Good peripheral perfusion.  Resp:  Normal effort.  Abd:  No distention.  Other:  Left CVA tenderness.   ED Results / Procedures / Treatments   Labs (all labs ordered are listed, but only abnormal results are displayed)  Labs Reviewed  URINALYSIS, ROUTINE W REFLEX MICROSCOPIC - Abnormal; Notable for the following components:      Result Value   Color, Urine STRAW (*)    APPearance CLEAR (*)    All other components within normal limits  BASIC METABOLIC PANEL WITH GFR - Abnormal; Notable for the following components:   Glucose, Bld 116 (*)    Creatinine, Ser 1.30 (*)    All other components within normal limits  CBC - Abnormal; Notable for the following components:   WBC 20.0 (*)    Platelets 434 (*)    All other components within normal limits     EKG  Not indicated.   RADIOLOGY  Image and radiology report reviewed and interpreted by me. Radiology report consistent with the same.  8 mm left UPJ stone with mild left hydronephrosis.  Fatty liver.  No bowel obstruction.  Normal appendix.  PROCEDURES:  Critical Care performed: No  Procedures   MEDICATIONS ORDERED IN ED:  Medications  sodium chloride  0.9 % bolus 1,000 mL (0 mLs Intravenous Stopped 12/29/23 2159)  fentaNYL  (SUBLIMAZE ) injection 50 mcg  (50 mcg Intravenous Given 12/29/23 2015)  ondansetron  (ZOFRAN ) injection 4 mg (4 mg Intravenous Given 12/29/23 2014)  cefTRIAXone (ROCEPHIN) 1 g in sodium chloride  0.9 % 100 mL IVPB (0 g Intravenous Stopped 12/29/23 2150)     IMPRESSION / MDM / ASSESSMENT AND PLAN / ED COURSE   I have reviewed the triage note and vital signs. Vital signs are stable   Differential diagnosis includes, but is not limited to, nephrolithiasis, ureterolithiasis, pyelonephritis, acute cystitis, musculoskeletal pain  Patient's presentation is most consistent with acute complicated illness / injury requiring diagnostic workup.  53 year old male presenting to the emergency department for treatment and evaluation of left flank pain.  See HPI for further details.  CVA tenderness is noted on exam.  Labs show no leukocytosis with a white count of 20.0.  Patient reports that he typically has an elevated white blood cell count but not to that degree.  BMP shows a glucose of 116 and a creatinine of 130 with a normal GFR.  Urinalysis is negative for indication of blood or infection.  Plan will be to give him IV fluids, pain medication, nausea medication and get a CT to look for stone or pyelonephritis.  8 mm stone at the left UPJ without significant hydronephrosis.  Pain and nausea is well-controlled at this time.  Patient has established care  with urology and has an upcoming appointment.  Does not appear that the stone is infected since the urinalysis is normal.  Plan will be to treat him empirically with antibiotics and have him take his Flomax  and pain medication.  He was encouraged to call urology in the morning to make them aware that the stone is now in the ureter in case they want to change his appointment date/time.  If his symptoms change or worsen and he is unable to see urology, he is to return to the emergency department.      FINAL CLINICAL IMPRESSION(S) / ED DIAGNOSES   Final diagnoses:  Nephrolithiasis      Rx / DC Orders   ED Discharge Orders          Ordered    cephALEXin  (KEFLEX ) 500 MG capsule  3 times daily        12/29/23 2119    ondansetron  (ZOFRAN ) 4 MG tablet  Every 8 hours PRN        12/29/23 2119    oxyCODONE -acetaminophen  (PERCOCET) 5-325 MG tablet  Every 6 hours PRN        12/29/23 2119             Note:  This document was prepared using Dragon voice recognition software and may include unintentional dictation errors.   Herlinda Kirk NOVAK, FNP 12/30/23 1927    Ernest Ronal BRAVO, MD 01/03/24 [phone]

## 2023-12-29 NOTE — ED Triage Notes (Signed)
 Patient states left flank pain that radiates to left lower groin; history of kidney stones.

## 2023-12-29 NOTE — ED Notes (Signed)
Urine sample collected and sent to lab at this time.

## 2024-01-02 ENCOUNTER — Other Ambulatory Visit: Payer: Self-pay

## 2024-01-02 DIAGNOSIS — N200 Calculus of kidney: Secondary | ICD-10-CM

## 2024-01-02 NOTE — Progress Notes (Unsigned)
 01/03/2024 8:44 AM   Manuel Manuel 01/14/1971 [id number]  Referring provider: [name], MD [phone] [facility] [address] [facility] [place],  [place] [address]  Urological history: 1.  Nephrolithiasis - Stone composition of calcium  oxalate monohydrate 50%, calcium  oxalate dihydrate 30% and calcium  phosphate 20% - Right ESWL (2023)   Chief Complaint  Patient presents with   Nephrolithiasis   HPI: Manuel Manuel is a 53 y.o. man who presents today for an 8 mm left UPJ stone with mild hydro with his wife, Manuel Manuel.   Previous records reviewed.   He presented to the ED on December 29, 2023 with a complaint of left flank pain that radiated to the left groin.  CT renal stone protocol noted 8 mm left UPJ stone with mild left hydronephrosis.    He was started on Keflex  500 mg 3 times daily, given Zofran  4 mg every 8 hours and Percocet 5/325 every 6 hours as needed.  His WBC count was 20.0, his serum creatinine was 1.30 and his UA was bland.    He continues to have left side pain that radiates up into his left flank area.  Patient denies any modifying or aggravating factors.  Patient denies any recent UTI's, gross hematuria, dysuria or suprapubic pain.  Patient denies any fevers, chills, nausea or vomiting.    KUB 8 mm left renal stone is seen, but it appears more lateral on KUB, so it may have migrated back into the kidney.  UA yellow clear, specific gravity 1.020, pH 6.0, 0-5 WBCs, 0-2 RBCs and 0-2 epithelial cells.   PMH: Past Medical History:  Diagnosis Date   Anxiety    Blood in stool    dark pebble like per pt  x4 wks   Chest pain    on going per pt-x 75mo   Chronic ankle pain    Right, s/p FCR(8006)   Chronic hip pain    right, s/p FCR(8006)   Curvature of spine    Dyspnea    Esophagitis    Frequent nosebleeds    x 2 mo per pt (march 2018)   GERD (gastroesophageal reflux disease)    Head injury 04/2016       Hemorrhoids    Hypertension     Kidney stone    Memory changes    x 1 yr-since 2017   Migraine    since 2013   Vasovagal syncope     Surgical History: Past Surgical History:  Procedure Laterality Date   BIOPSY  03/29/2023   Procedure: BIOPSY;  Surgeon: Toledo, Ladell POUR, MD;  Location: [facility];  Service: Gastroenterology;;   CARDIAC CATHETERIZATION     CHOLECYSTECTOMY N/A 08/23/2018   Procedure: LAPAROSCOPIC CHOLECYSTECTOMY;  Surgeon: Rodolph Romano, MD;  Location: [facility];  Service: General;  Laterality: N/A;   COLONOSCOPY N/A 03/29/2023   Procedure: COLONOSCOPY;  Surgeon: Toledo, Ladell POUR, MD;  Location: [facility];  Service: Gastroenterology;  Laterality: N/A;   COLONOSCOPY WITH PROPOFOL  N/A 12/16/2016   Procedure: COLONOSCOPY WITH PROPOFOL ;  Surgeon: Jinny Carmine, MD;  Location: [facility];  Service: Endoscopy;  Laterality: N/A;   ESOPHAGOGASTRODUODENOSCOPY N/A 03/29/2023   Procedure: ESOPHAGOGASTRODUODENOSCOPY (EGD);  Surgeon: Toledo, Ladell POUR, MD;  Location: [facility];  Service: Gastroenterology;  Laterality: N/A;   ESOPHAGOGASTRODUODENOSCOPY (EGD) WITH PROPOFOL  N/A 12/16/2016   Procedure: ESOPHAGOGASTRODUODENOSCOPY (EGD) WITH PROPOFOL ;  Surgeon: Jinny Carmine, MD;  Location: [facility];  Service: Endoscopy;  Laterality: N/A;   ESOPHAGOGASTRODUODENOSCOPY (EGD) WITH PROPOFOL   N/A 08/09/2018   Procedure: ESOPHAGOGASTRODUODENOSCOPY (EGD) WITH PROPOFOL ;  Surgeon: Toledo, Ladell POUR, MD;  Location: [facility];  Service: Gastroenterology;  Laterality: N/A;   EXTRACORPOREAL SHOCK WAVE LITHOTRIPSY Right 07/09/2021   Procedure: EXTRACORPOREAL SHOCK WAVE LITHOTRIPSY (ESWL);  Surgeon: Twylla Glendia BROCKS, MD;  Location: [facility];  Service: Urology;  Laterality: Right;   KNEE SURGERY Left    POLYPECTOMY  12/16/2016   Procedure: POLYPECTOMY;  Surgeon: Jinny Carmine, MD;  Location: [facility];  Service: Endoscopy;;   POLYPECTOMY  03/29/2023   Procedure: POLYPECTOMY;  Surgeon:  Toledo, Teodoro K, MD;  Location: [facility];  Service: Gastroenterology;;   vastectomy  1999    Home Medications:  Allergies as of 01/03/2024       Reactions   Valium [diazepam] Other (See Comments)   convulsions   Nortriptyline Other (See Comments)   Unable to focus, jittery   Sulfamethoxazole-trimethoprim Other (See Comments)   States blood pressure elevated.        Medication List        Accurate as of January 03, 2024  8:44 AM. If you have any questions, ask your nurse or doctor.          STOP taking these medications    meloxicam  15 MG tablet Commonly known as: MOBIC  Stopped by: Tedric Leeth   oxyCODONE -acetaminophen  5-325 MG tablet Commonly known as: Percocet Stopped by: Rabia Argote   polyethylene glycol 17 g packet Commonly known as: MIRALAX / GLYCOLAX Stopped by: Deborha Moseley       TAKE these medications    amitriptyline 25 MG tablet Commonly known as: ELAVIL Take 25 mg by mouth at bedtime.   amLODipine 10 MG tablet Commonly known as: NORVASC Take 10 mg by mouth daily.   CENTRUM SILVER 50+MEN PO Take 1 tablet by mouth daily.   cephALEXin  500 MG capsule Commonly known as: KEFLEX  Take 1 capsule (500 mg total) by mouth 3 (three) times daily for 7 days.   Eohilia  2 MG/10ML Susp Generic drug: Budesonide    ibuprofen  800 MG tablet Commonly known as: ADVIL  Take 800 mg by mouth.   meclizine 25 MG tablet Commonly known as: ANTIVERT Take by mouth.   ondansetron  4 MG tablet Commonly known as: ZOFRAN  Take 1 tablet (4 mg total) by mouth every 8 (eight) hours as needed for nausea or vomiting.   pantoprazole  20 MG tablet Commonly known as: PROTONIX  Take 20 mg by mouth 2 (two) times daily before a meal.   predniSONE  10 MG tablet Commonly known as: DELTASONE  Take by mouth.   tamsulosin  0.4 MG Caps capsule Commonly known as: FLOMAX  Take 1 capsule (0.4 mg total) by mouth daily.        Allergies:  Allergies  Allergen  Reactions   Valium [Diazepam] Other (See Comments)    convulsions   Nortriptyline Other (See Comments)    Unable to focus, jittery   Sulfamethoxazole-Trimethoprim Other (See Comments)    States blood pressure elevated.    Family History: Family History  Problem Relation Age of Onset   Hypertension Mother    Breast cancer Mother    CAD Father    Heart attack Father    Bladder Cancer Brother     Social History:  reports that he has never smoked. He has never been exposed to tobacco smoke. He has never used smokeless tobacco. He reports that he does not drink alcohol and does not use drugs.  ROS: Pertinent ROS in HPI  Physical Exam: BP ROLLEN)  145/85 (BP Location: Left Arm, Patient Position: Sitting, Cuff Size: Large)   Pulse 66   Ht 5' 9.5 (1.765 m)   Wt 292 lb (132.5 kg)   BMI 42.50 kg/m   Constitutional:  Well nourished. Alert and oriented, No acute distress. HEENT:  AT, moist mucus membranes.  Trachea midline Cardiovascular: No clubbing, cyanosis, or edema. Respiratory: Normal respiratory effort, no increased work of breathing. Neurologic: Grossly intact, no focal deficits, moving all 4 extremities. Psychiatric: Normal mood and affect.   Laboratory Data: See EPIC and HPI  I have reviewed the labs.   Pertinent Imaging: CLINICAL DATA:  Abdominal/flank pain.  Concern for kidney stone.   EXAM: CT ABDOMEN AND PELVIS WITHOUT CONTRAST   TECHNIQUE: Multidetector CT imaging of the abdomen and pelvis was performed following the standard protocol without IV contrast.   RADIATION DOSE REDUCTION: This exam was performed according to the departmental dose-optimization program which includes automated exposure control, adjustment of the mA and/or kV according to patient size and/or use of iterative reconstruction technique.   COMPARISON:  CT dated 07/07/2023.   FINDINGS: Evaluation of this exam is limited in the absence of intravenous contrast.   Lower chest: The  visualized lung bases are clear.   No intra-abdominal free air or free fluid.   Hepatobiliary: Fatty liver. No biliary dilatation. Cholecystectomy.   Pancreas: Unremarkable. No pancreatic ductal dilatation or surrounding inflammatory changes.   Spleen: Normal in size without focal abnormality.   Adrenals/Urinary Tract: The adrenal glands are unremarkable. There is an 8 mm stone at the left ureteropelvic junction corresponding to the stone seen in the inferior pole of the left kidney on the prior CT. There is mild left hydronephrosis. The right kidney, visualized ureters, and urinary bladder appear unremarkable.   Stomach/Bowel: There is no bowel obstruction or active inflammation. The appendix is normal.   Vascular/Lymphatic: The abdominal aorta and IVC unremarkable. No portal venous gas. There is no adenopathy.   Reproductive: The prostate and seminal vesicles are grossly unremarkable.   Other: None   Musculoskeletal: L4-L5 disc spacer and posterior fusion. No acute osseous pathology.   IMPRESSION: 1. An 8 mm left UPJ stone with mild left hydronephrosis. 2. Fatty liver. 3. No bowel obstruction. Normal appendix.     Electronically Signed   By: Vanetta Chou M.D.   On: 12/29/2023 20:41    I have independently reviewed the films.  See HPI.    Assessment & Plan:    1. Left UPJ stone - SD > 1500 HU and SSD > 15 cm  - KUB 8 mm left stone is clearly visible  - We discussed various treatment options for urolithiasis including observation with or without medical expulsive therapy, shockwave lithotripsy (SWL), ureteroscopy and laser lithotripsy with stent placement and given stone size, stone location and stone density, explained that ESWL has a lower stone-free rate in a single procedure, but also a lower complication rate compared to ureteroscopy, and avoids a stent and associated stent related symptoms. Possible complications include renal hematoma, steinstrasse, and the  need for additional treatment, explained that ureteroscopy with laser lithotripsy and stent placement has a higher stone-free rate than SWL in a single procedure; however, there is an increased complication rate, including possible infection, ureteral injury, bleeding, and stent-related morbidity. Common stent-related symptoms include dysuria, urgency/frequency, and flank pain. -Explained to the patient that with ESWL, shock waves are focused on the stone using X-rays to pinpoint the stone.  The shock waves are fired repeatedly which  usually causes the stone to break into small pieces which pass out in the urine over the next few weeks.  They will go home that day and may be able to resume normal activities in three days.  They should be given a strainer and they need to collect any fragments/sediments that they pass for analysis.   Risks involved with the procedure consist of bruising of the skin and kidney, possible long-term kidney damage, development of HTN, damage to the bowel, spleen, liver, pancreas, male organs or lungs, hematuria, hematuria serious enough to require transfusion or surgical repair, formation of a hematoma, infection or sepsis.  If the stone is too large or dense, it may not break apart or break apart in large pieces and cause Steinstrasse.  If this happens, it would result in the need for another procedure (likely URS/LL/stent placement).  IV sedation is typically used, but in rare instances general anesthesia is used with the risk of irregular heart beat, irregular BP, stroke, MI, CVA, paralysis, coma and/or death.     No follow-ups on file.  These notes generated with voice recognition software. I apologize for typographical errors.  CLOTILDA HELON RIGGERS  [facility] [address]  [address] [place], [place] [address] [phone]     ESWL ORDER FORM  Expected date of procedure: 01/05/2024  Surgeon: Redell Burnet, MD  Post op  standing: 2-4wk follow up w/KUB prior  Anticoagulation/Aspirin/NSAID standing order: Hold all 72 hours prior  Anesthesia standing order: MAC  VTE standing: SCD's  Dx: Left Ureteral Stone  Procedure: left Extracorporeal shock wave lithotripsy  CPT : 50590  Standing Order Set:   *NPO after mn, KUB  *NS 133ml/hr, Keflex  500mg  PO, Benadryl  25mg  PO, Valium 10mg  PO, Zofran  4mg  IV  Medications if other than standing orders:   NONE

## 2024-01-02 NOTE — H&P (View-Only) (Signed)
 01/03/2024 8:44 AM   [place] Rash 03-Mar-1970 [id number]  Referring provider: [name], MD [phone] [facility] [place],  [place] [address]  Urological history: 1.  Nephrolithiasis - Stone composition of calcium  oxalate monohydrate 50%, calcium  oxalate dihydrate 30% and calcium  phosphate 20% - Right ESWL (2023)   Chief Complaint  Patient presents with   Nephrolithiasis   HPI: Manuel [place] is a 53 y.o. man who presents today for an 8 mm left UPJ stone with mild hydro with his wife, [place].   Previous records reviewed.   He presented to the ED on December 29, 2023 with a complaint of left flank pain that radiated to the left groin.  CT renal stone protocol noted 8 mm left UPJ stone with mild left hydronephrosis.    He was started on Keflex  500 mg 3 times daily, given Zofran  4 mg every 8 hours and Percocet 5/325 every 6 hours as needed.  His WBC count was 20.0, his serum creatinine was 1.30 and his UA was bland.    He continues to have left side pain that radiates up into his left flank area.  Patient denies any modifying or aggravating factors.  Patient denies any recent UTI's, gross hematuria, dysuria or suprapubic pain.  Patient denies any fevers, chills, nausea or vomiting.    KUB 8 mm left renal stone is seen, but it appears more lateral on KUB, so it may have migrated back into the kidney.  UA yellow clear, specific gravity 1.020, pH 6.0, 0-5 WBCs, 0-2 RBCs and 0-2 epithelial cells.   PMH: Past Medical History:  Diagnosis Date   Anxiety    Blood in stool    dark pebble like per pt  x4 wks   Chest pain    on going per pt-x 73mo   Chronic ankle pain    Right, s/p FCR(8006)   Chronic hip pain    right, s/p FCR(8006)   Curvature of spine    Dyspnea    Esophagitis    Frequent nosebleeds    x 2 mo per pt (march 2018)   GERD (gastroesophageal reflux disease)    Head injury 04/2016       Hemorrhoids    Hypertension     Kidney stone    Memory changes    x 1 yr-since 2017   Migraine    since 2013   Vasovagal syncope     Surgical History: Past Surgical History:  Procedure Laterality Date   BIOPSY  03/29/2023   Procedure: BIOPSY;  Surgeon: Toledo, Ladell POUR, MD;  Location: [facility];  Service: Gastroenterology;;   CARDIAC CATHETERIZATION     CHOLECYSTECTOMY N/A 08/23/2018   Procedure: LAPAROSCOPIC CHOLECYSTECTOMY;  Surgeon: Manuel Romano, MD;  Location: [facility];  Service: General;  Laterality: N/A;   COLONOSCOPY N/A 03/29/2023   Procedure: COLONOSCOPY;  Surgeon: Toledo, Ladell POUR, MD;  Location: [facility];  Service: Gastroenterology;  Laterality: N/A;   COLONOSCOPY WITH PROPOFOL  N/A 12/16/2016   Procedure: COLONOSCOPY WITH PROPOFOL ;  Surgeon: Manuel Carmine, MD;  Location: [facility];  Service: Endoscopy;  Laterality: N/A;   ESOPHAGOGASTRODUODENOSCOPY N/A 03/29/2023   Procedure: ESOPHAGOGASTRODUODENOSCOPY (EGD);  Surgeon: Toledo, Ladell POUR, MD;  Location: [facility];  Service: Gastroenterology;  Laterality: N/A;   ESOPHAGOGASTRODUODENOSCOPY (EGD) WITH PROPOFOL  N/A 12/16/2016   Procedure: ESOPHAGOGASTRODUODENOSCOPY (EGD) WITH PROPOFOL ;  Surgeon: Manuel Carmine, MD;  Location: [facility];  Service: Endoscopy;  Laterality: N/A;   ESOPHAGOGASTRODUODENOSCOPY (EGD) WITH PROPOFOL   N/A 08/09/2018   Procedure: ESOPHAGOGASTRODUODENOSCOPY (EGD) WITH PROPOFOL ;  Surgeon: Toledo, Ladell POUR, MD;  Location: [facility];  Service: Gastroenterology;  Laterality: N/A;   EXTRACORPOREAL SHOCK WAVE LITHOTRIPSY Right 07/09/2021   Procedure: EXTRACORPOREAL SHOCK WAVE LITHOTRIPSY (ESWL);  Surgeon: Manuel Glendia BROCKS, MD;  Location: [facility];  Service: Urology;  Laterality: Right;   KNEE SURGERY Left    POLYPECTOMY  12/16/2016   Procedure: POLYPECTOMY;  Surgeon: Manuel Carmine, MD;  Location: [facility];  Service: Endoscopy;;   POLYPECTOMY  03/29/2023   Procedure: POLYPECTOMY;  Surgeon:  Toledo, Teodoro K, MD;  Location: [facility];  Service: Gastroenterology;;   vastectomy  1999    Home Medications:  Allergies as of 01/03/2024       Reactions   Valium [diazepam] Other (See Comments)   convulsions   Nortriptyline Other (See Comments)   Unable to focus, jittery   Sulfamethoxazole-trimethoprim Other (See Comments)   States blood pressure elevated.        Medication List        Accurate as of January 03, 2024  8:44 AM. If you have any questions, ask your nurse or doctor.          STOP taking these medications    meloxicam  15 MG tablet Commonly known as: MOBIC  Stopped by: Manuel Cain   oxyCODONE -acetaminophen  5-325 MG tablet Commonly known as: Percocet Stopped by: Manuel [place]   polyethylene glycol 17 g packet Commonly known as: MIRALAX / GLYCOLAX Stopped by: Manuel [place]       TAKE these medications    amitriptyline 25 MG tablet Commonly known as: ELAVIL Take 25 mg by mouth at bedtime.   amLODipine 10 MG tablet Commonly known as: NORVASC Take 10 mg by mouth daily.   CENTRUM SILVER 50+MEN PO Take 1 tablet by mouth daily.   cephALEXin  500 MG capsule Commonly known as: KEFLEX  Take 1 capsule (500 mg total) by mouth 3 (three) times daily for 7 days.   Eohilia  2 MG/10ML Susp Generic drug: Budesonide    ibuprofen  800 MG tablet Commonly known as: ADVIL  Take 800 mg by mouth.   meclizine 25 MG tablet Commonly known as: ANTIVERT Take by mouth.   ondansetron  4 MG tablet Commonly known as: ZOFRAN  Take 1 tablet (4 mg total) by mouth every 8 (eight) hours as needed for nausea or vomiting.   pantoprazole  20 MG tablet Commonly known as: PROTONIX  Take 20 mg by mouth 2 (two) times daily before a meal.   predniSONE  10 MG tablet Commonly known as: DELTASONE  Take by mouth.   tamsulosin  0.4 MG Caps capsule Commonly known as: FLOMAX  Take 1 capsule (0.4 mg total) by mouth daily.        Allergies:  Allergies  Allergen  Reactions   Valium [Diazepam] Other (See Comments)    convulsions   Nortriptyline Other (See Comments)    Unable to focus, jittery   Sulfamethoxazole-Trimethoprim Other (See Comments)    States blood pressure elevated.    Family History: Family History  Problem Relation Age of Onset   Hypertension Mother    Breast cancer Mother    CAD Father    Heart attack Father    Bladder Cancer Brother     Social History:  reports that he has never smoked. He has never been exposed to tobacco smoke. He has never used smokeless tobacco. He reports that he does not drink alcohol and does not use drugs.  ROS: Pertinent ROS in HPI  Physical Exam: BP ROLLEN)  145/85 (BP Location: Left Arm, Patient Position: Sitting, Cuff Size: Large)   Pulse 66   Ht 5' 9.5 (1.765 m)   Wt 292 lb (132.5 kg)   BMI 42.50 kg/m   Constitutional:  Well nourished. Alert and oriented, No acute distress. HEENT: [place] AT, moist mucus membranes.  Trachea midline Cardiovascular: No clubbing, cyanosis, or edema. Respiratory: Normal respiratory effort, no increased work of breathing. Neurologic: Grossly intact, no focal deficits, moving all 4 extremities. Psychiatric: Normal mood and affect.   Laboratory Data: See EPIC and HPI  I have reviewed the labs.   Pertinent Imaging: CLINICAL DATA:  Abdominal/flank pain.  Concern for kidney stone.   EXAM: CT ABDOMEN AND PELVIS WITHOUT CONTRAST   TECHNIQUE: Multidetector CT imaging of the abdomen and pelvis was performed following the standard protocol without IV contrast.   RADIATION DOSE REDUCTION: This exam was performed according to the departmental dose-optimization program which includes automated exposure control, adjustment of the mA and/or kV according to patient size and/or use of iterative reconstruction technique.   COMPARISON:  CT dated 07/07/2023.   FINDINGS: Evaluation of this exam is limited in the absence of intravenous contrast.   Lower chest: The  visualized lung bases are clear.   No intra-abdominal free air or free fluid.   Hepatobiliary: Fatty liver. No biliary dilatation. Cholecystectomy.   Pancreas: Unremarkable. No pancreatic ductal dilatation or surrounding inflammatory changes.   Spleen: Normal in size without focal abnormality.   Adrenals/Urinary Tract: The adrenal glands are unremarkable. There is an 8 mm stone at the left ureteropelvic junction corresponding to the stone seen in the inferior pole of the left kidney on the prior CT. There is mild left hydronephrosis. The right kidney, visualized ureters, and urinary bladder appear unremarkable.   Stomach/Bowel: There is no bowel obstruction or active inflammation. The appendix is normal.   Vascular/Lymphatic: The abdominal aorta and IVC unremarkable. No portal venous gas. There is no adenopathy.   Reproductive: The prostate and seminal vesicles are grossly unremarkable.   Other: None   Musculoskeletal: L4-L5 disc spacer and posterior fusion. No acute osseous pathology.   IMPRESSION: 1. An 8 mm left UPJ stone with mild left hydronephrosis. 2. Fatty liver. 3. No bowel obstruction. Normal appendix.     Electronically Signed   By: Vanetta Chou M.D.   On: 12/29/2023 20:41    I have independently reviewed the films.  See HPI.    Assessment & Plan:    1. Left UPJ stone - SD > 1500 HU and SSD > 15 cm  - KUB 8 mm left stone is clearly visible  - We discussed various treatment options for urolithiasis including observation with or without medical expulsive therapy, shockwave lithotripsy (SWL), ureteroscopy and laser lithotripsy with stent placement and given stone size, stone location and stone density, explained that ESWL has a lower stone-free rate in a single procedure, but also a lower complication rate compared to ureteroscopy, and avoids a stent and associated stent related symptoms. Possible complications include renal hematoma, steinstrasse, and the  need for additional treatment, explained that ureteroscopy with laser lithotripsy and stent placement has a higher stone-free rate than SWL in a single procedure; however, there is an increased complication rate, including possible infection, ureteral injury, bleeding, and stent-related morbidity. Common stent-related symptoms include dysuria, urgency/frequency, and flank pain. -Explained to the patient that with ESWL, shock waves are focused on the stone using X-rays to pinpoint the stone.  The shock waves are fired repeatedly which  usually causes the stone to break into small pieces which pass out in the urine over the next few weeks.  They will go home that day and may be able to resume normal activities in three days.  They should be given a strainer and they need to collect any fragments/sediments that they pass for analysis.   Risks involved with the procedure consist of bruising of the skin and kidney, possible long-term kidney damage, development of HTN, damage to the bowel, spleen, liver, pancreas, male organs or lungs, hematuria, hematuria serious enough to require transfusion or surgical repair, formation of a hematoma, infection or sepsis.  If the stone is too large or dense, it may not break apart or break apart in large pieces and cause Steinstrasse.  If this happens, it would result in the need for another procedure (likely URS/LL/stent placement).  IV sedation is typically used, but in rare instances general anesthesia is used with the risk of irregular heart beat, irregular BP, stroke, MI, CVA, paralysis, coma and/or death.     No follow-ups on file.  These notes generated with voice recognition software. I apologize for typographical errors.  CLOTILDA HELON RIGGERS  [facility] [address]  [address] [place], [place] [address] [phone]     ESWL ORDER FORM  Expected date of procedure: 01/05/2024  Surgeon: Redell Burnet, MD  Post op  standing: 2-4wk follow up w/KUB prior  Anticoagulation/Aspirin/NSAID standing order: Hold all 72 hours prior  Anesthesia standing order: MAC  VTE standing: SCD's  Dx: Left Ureteral Stone  Procedure: left Extracorporeal shock wave lithotripsy  CPT : 50590  Standing Order Set:   *NPO after mn, KUB  *NS 146ml/hr, Keflex  500mg  PO, Benadryl  25mg  PO, Valium 10mg  PO, Zofran  4mg  IV  Medications if other than standing orders:   NONE

## 2024-01-03 ENCOUNTER — Encounter: Payer: Self-pay | Admitting: Urology

## 2024-01-03 ENCOUNTER — Ambulatory Visit
Admission: RE | Admit: 2024-01-03 | Discharge: 2024-01-03 | Disposition: A | Discharge status: Home / Self Care | Attending: Urology | Admitting: Urology

## 2024-01-03 ENCOUNTER — Ambulatory Visit: Admitting: Urology

## 2024-01-03 ENCOUNTER — Other Ambulatory Visit: Payer: Self-pay

## 2024-01-03 ENCOUNTER — Ambulatory Visit
Admission: RE | Admit: 2024-01-03 | Discharge: 2024-01-03 | Disposition: A | Discharge status: Home / Self Care | Source: Ambulatory Visit | Attending: Urology | Admitting: Urology

## 2024-01-03 VITALS — BP 145/85 | HR 66 | Ht 69.5 in | Wt 292.0 lb

## 2024-01-03 DIAGNOSIS — N200 Calculus of kidney: Secondary | ICD-10-CM

## 2024-01-03 DIAGNOSIS — N201 Calculus of ureter: Secondary | ICD-10-CM

## 2024-01-03 LAB — URINALYSIS, COMPLETE
Bilirubin, UA: NEGATIVE
Glucose, UA: NEGATIVE
Ketones, UA: NEGATIVE
Leukocytes,UA: NEGATIVE
Nitrite, UA: NEGATIVE
Protein,UA: NEGATIVE
RBC, UA: NEGATIVE
Specific Gravity, UA: 1.02 (ref 1.005–1.030)
Urobilinogen, Ur: 0.2 mg/dL (ref 0.2–1.0)
pH, UA: 6 (ref 5.0–7.5)

## 2024-01-03 LAB — MICROSCOPIC EXAMINATION: Bacteria, UA: NONE SEEN

## 2024-01-03 NOTE — Progress Notes (Signed)
    [facility] ESWL POSTING SHEET        Patient Name: Manuel Cain  DOB: 07/13/1970  MRN: [id number]  Surgeon:  Redell Burnet, MD  Diagnosis:  Left Ureteral Stone  CPT: 49409  ESWL DATE: 01/05/2024  ESWL TIME: 1000am  Special Needs/Requirements: None       Cardiac/Medical/Pulmonary Clearance needed: No            Form Faxed to [facility]- [phone] Date:   Date: 01/03/24       Form Faxed to [facility]- [phone]  Date:  Date: 01/03/24           Copy Made for Insurance PA:  Date: 01/03/24       Orders Entered in to Epic:  Date: 01/03/24        No Valium Ordered due to Allergy.

## 2024-01-03 NOTE — Progress Notes (Signed)
 ESWL ORDER FORM  Expected date of procedure: 01/05/2024  Surgeon: Redell Burnet, MD  Post op standing: 2-4wk follow up w/KUB prior  Anticoagulation/Aspirin/NSAID standing order: Hold all 72 hours prior  Anesthesia standing order: MAC  VTE standing: SCD's  Dx: Left Ureteral Stone  Procedure: left Extracorporeal shock wave lithotripsy  CPT : 50590  Standing Order Set:   *NPO after mn, KUB  *NS 188ml/hr, Keflex  500mg  PO, Benadryl  25mg  PO, Valium 10mg  PO, Zofran  4mg  IV  Medications if other than standing orders:   NONE

## 2024-01-04 MED ORDER — ONDANSETRON HCL 4 MG/2ML IJ SOLN
4.0000 mg | Freq: Once | INTRAMUSCULAR | Status: AC
  Administered 2024-01-05: 4 mg via INTRAVENOUS

## 2024-01-04 MED ORDER — DIPHENHYDRAMINE HCL 25 MG PO CAPS: 25.0000 mg | ORAL_CAPSULE | ORAL | Status: DC

## 2024-01-04 MED ORDER — CEPHALEXIN 500 MG PO CAPS: 500.0000 mg | ORAL_CAPSULE | Freq: Once | ORAL | Status: DC

## 2024-01-04 MED ORDER — SODIUM CHLORIDE 0.9 % IV SOLN: INTRAVENOUS | Status: DC

## 2024-01-05 ENCOUNTER — Ambulatory Visit

## 2024-01-05 ENCOUNTER — Encounter: Payer: Self-pay | Admitting: Urology

## 2024-01-05 ENCOUNTER — Encounter
Admission: RE | Disposition: A | Discharge status: Home / Self Care | Payer: Self-pay | Source: Home / Self Care | Attending: Urology

## 2024-01-05 ENCOUNTER — Other Ambulatory Visit: Payer: Self-pay

## 2024-01-05 ENCOUNTER — Ambulatory Visit: Admitting: Physician Assistant

## 2024-01-05 ENCOUNTER — Ambulatory Visit
Admission: RE | Admit: 2024-01-05 | Discharge: 2024-01-05 | Disposition: A | Discharge status: Home / Self Care | Attending: Urology | Admitting: Urology

## 2024-01-05 DIAGNOSIS — Z6841 Body Mass Index (BMI) 40.0 and over, adult: Secondary | ICD-10-CM | POA: Diagnosis not present

## 2024-01-05 DIAGNOSIS — I10 Essential (primary) hypertension: Secondary | ICD-10-CM | POA: Diagnosis not present

## 2024-01-05 DIAGNOSIS — N201 Calculus of ureter: Secondary | ICD-10-CM

## 2024-01-05 DIAGNOSIS — N132 Hydronephrosis with renal and ureteral calculous obstruction: Secondary | ICD-10-CM | POA: Diagnosis not present

## 2024-01-05 DIAGNOSIS — N200 Calculus of kidney: Secondary | ICD-10-CM | POA: Diagnosis present

## 2024-01-05 DIAGNOSIS — E669 Obesity, unspecified: Secondary | ICD-10-CM | POA: Diagnosis not present

## 2024-01-05 HISTORY — PX: EXTRACORPOREAL SHOCK WAVE LITHOTRIPSY: SHX1557

## 2024-01-05 SURGERY — LITHOTRIPSY, ESWL
Anesthesia: Moderate Sedation | Laterality: Left

## 2024-01-05 MED ORDER — DIPHENHYDRAMINE HCL 25 MG PO CAPS
ORAL_CAPSULE | ORAL | Status: AC
  Filled 2024-01-05: qty 1

## 2024-01-05 MED ORDER — ONDANSETRON HCL 4 MG/2ML IJ SOLN
INTRAMUSCULAR | Status: AC
  Filled 2024-01-05: qty 2

## 2024-01-05 MED ORDER — CEPHALEXIN 500 MG PO CAPS
ORAL_CAPSULE | ORAL | Status: AC
  Filled 2024-01-05: qty 1

## 2024-01-05 MED ORDER — KETOROLAC TROMETHAMINE 10 MG PO TABS: 10.0000 mg | ORAL_TABLET | Freq: Four times a day (QID) | ORAL | 0 refills | Status: AC | PRN

## 2024-01-05 NOTE — Interval H&P Note (Signed)
 UROLOGY H&P UPDATE  Agree with prior H&P dated 01/03/24 by Clotilda Cornwall PA, 8mm LEFT UPJ stone, on KUB today appears to have moved back into lower pole. He deferred URS, higher risk for SWL failure with morbid obesity.  Cardiac: RRR Lungs: CTA bilaterally  Laterality: LEFT Procedure: LEFT shockwave lithotripsy  Urine: culture 11/4 no growth  Informed consent obtained, we specifically discussed the risks of bleeding/hematoma, infection, post-operative pain/obstructive fragments, need for additional procedures including URS/LL/stent.  Redell JAYSON Burnet, MD 01/05/2024

## 2024-01-05 NOTE — Brief Op Note (Signed)
 01/05/2024  10:02 AM  PATIENT:  Manuel Cain  53 y.o. male  PRE-OPERATIVE DIAGNOSIS:  1cm Left lower pole renal Stone    POST-OPERATIVE DIAGNOSIS:  Same  PROCEDURE:  Procedure(s): LITHOTRIPSY, ESWL (Left)  SURGEON:  Surgeons and Role:    DEWAINE Francisca Redell JAYSON, MD - Primary  ANESTHESIA: Conscious Sedation  EBL:  None  Drains: None  Specimen: None  Findings:  Stone fragmented well, tolerated SWL  DISPO: Flomax , pain meds PRN, RTC 2 weeks KUB  Redell Francisca, MD 01/05/2024

## 2024-01-06 ENCOUNTER — Encounter: Payer: Self-pay | Admitting: Urology

## 2024-01-06 LAB — CULTURE, URINE COMPREHENSIVE

## 2024-01-10 ENCOUNTER — Other Ambulatory Visit: Payer: Self-pay | Admitting: Urology

## 2024-01-10 DIAGNOSIS — N201 Calculus of ureter: Secondary | ICD-10-CM

## 2024-01-11 ENCOUNTER — Ambulatory Visit
Admission: RE | Admit: 2024-01-11 | Discharge: 2024-01-11 | Disposition: A | Discharge status: Home / Self Care | Attending: Urology | Admitting: Urology

## 2024-01-11 ENCOUNTER — Ambulatory Visit
Admission: RE | Admit: 2024-01-11 | Discharge: 2024-01-11 | Disposition: A | Discharge status: Home / Self Care | Source: Ambulatory Visit | Attending: Urology | Admitting: Urology

## 2024-01-11 DIAGNOSIS — N201 Calculus of ureter: Secondary | ICD-10-CM | POA: Insufficient documentation

## 2024-01-22 NOTE — Progress Notes (Deleted)
 01/24/2024 11:03 PM   Manuel Cain Rash 27-Jun-1970 [id number]  Referring provider: [name], MD [phone] [facility] [address] [facility] [place],  [place] [address]  No chief complaint on file.   HPI: Manuel Cain is a 53 y.o. who is status post ESWL who presents today for follow up.  Underwent ESWL on 01/05/2024 for 8 mm left UPJ stone with Dr. Francisca.  Their postprocedural course was as expected and uneventful.   They have/have not passed fragments. ***   They bring in fragments for analysis. ***  KUB ***  UA ***  PMH: Past Medical History:  Diagnosis Date   Anxiety    Blood in stool    dark pebble like per pt  x4 wks   Chest pain    on going per pt-x 41mo   Chronic ankle pain    Right, s/p FCR(8006)   Chronic hip pain    right, s/p FCR(8006)   Curvature of spine    Dyspnea    Esophagitis    Frequent nosebleeds    x 2 mo per pt (march 2018)   GERD (gastroesophageal reflux disease)    Head injury 04/2016       Hemorrhoids    Hypertension    Kidney stone    Memory changes    x 1 yr-since 2017   Migraine    since 2013   Vasovagal syncope     Surgical History: Past Surgical History:  Procedure Laterality Date   BIOPSY  03/29/2023   Procedure: BIOPSY;  Surgeon: Toledo, Ladell POUR, MD;  Location: [facility];  Service: Gastroenterology;;   CARDIAC CATHETERIZATION     CHOLECYSTECTOMY N/A 08/23/2018   Procedure: LAPAROSCOPIC CHOLECYSTECTOMY;  Surgeon: Rodolph Romano, MD;  Location: [facility];  Service: General;  Laterality: N/A;   COLONOSCOPY N/A 03/29/2023   Procedure: COLONOSCOPY;  Surgeon: Toledo, Ladell POUR, MD;  Location: [facility];  Service: Gastroenterology;  Laterality: N/A;   COLONOSCOPY WITH PROPOFOL  N/A 12/16/2016   Procedure: COLONOSCOPY WITH PROPOFOL ;  Surgeon: Jinny Carmine, MD;  Location: [facility];  Service: Endoscopy;  Laterality: N/A;   ESOPHAGOGASTRODUODENOSCOPY N/A 03/29/2023   Procedure:  ESOPHAGOGASTRODUODENOSCOPY (EGD);  Surgeon: Toledo, Ladell POUR, MD;  Location: [facility];  Service: Gastroenterology;  Laterality: N/A;   ESOPHAGOGASTRODUODENOSCOPY (EGD) WITH PROPOFOL  N/A 12/16/2016   Procedure: ESOPHAGOGASTRODUODENOSCOPY (EGD) WITH PROPOFOL ;  Surgeon: Jinny Carmine, MD;  Location: [facility];  Service: Endoscopy;  Laterality: N/A;   ESOPHAGOGASTRODUODENOSCOPY (EGD) WITH PROPOFOL  N/A 08/09/2018   Procedure: ESOPHAGOGASTRODUODENOSCOPY (EGD) WITH PROPOFOL ;  Surgeon: Toledo, Ladell POUR, MD;  Location: [facility];  Service: Gastroenterology;  Laterality: N/A;   EXTRACORPOREAL SHOCK WAVE LITHOTRIPSY Right 07/09/2021   Procedure: EXTRACORPOREAL SHOCK WAVE LITHOTRIPSY (ESWL);  Surgeon: Twylla Glendia BROCKS, MD;  Location: [facility];  Service: Urology;  Laterality: Right;   EXTRACORPOREAL SHOCK WAVE LITHOTRIPSY Left 01/05/2024   Procedure: LITHOTRIPSY, ESWL;  Surgeon: Francisca Redell BROCKS, MD;  Location: [facility];  Service: Urology;  Laterality: Left;   KNEE SURGERY Left    POLYPECTOMY  12/16/2016   Procedure: POLYPECTOMY;  Surgeon: Jinny Carmine, MD;  Location: [facility];  Service: Endoscopy;;   POLYPECTOMY  03/29/2023   Procedure: POLYPECTOMY;  Surgeon: Toledo, Ladell POUR, MD;  Location: [facility];  Service: Gastroenterology;;   vastectomy  1999    Home Medications:  Current Outpatient Medications on File Prior to Visit  Medication Sig Dispense Refill   acetaminophen  (TYLENOL ) 325 MG tablet Take 650 mg by mouth every 6 (  six) hours as needed.     acetaminophen  (TYLENOL ) 500 MG tablet Take 500 mg by mouth every 6 (six) hours as needed for moderate pain (pain score 4-6). Packets not tablets     amitriptyline (ELAVIL) 25 MG tablet Take 25 mg by mouth at bedtime. (Patient not taking: Reported on 01/05/2024)     amLODipine (NORVASC) 10 MG tablet Take 10 mg by mouth daily.     EOHILIA  2 MG/10ML SUSP  (Patient not taking: Reported on 01/05/2024)     ibuprofen  (ADVIL ) 800  MG tablet Take 800 mg by mouth.     ketorolac  (TORADOL ) 10 MG tablet Take 1 tablet (10 mg total) by mouth every 6 (six) hours as needed for severe pain (pain score 7-10). 10 tablet 0   meclizine (ANTIVERT) 25 MG tablet Take by mouth.     Multiple Vitamins-Minerals (CENTRUM SILVER 50+MEN PO) Take 1 tablet by mouth daily.      ondansetron  (ZOFRAN ) 4 MG tablet Take 1 tablet (4 mg total) by mouth every 8 (eight) hours as needed for nausea or vomiting. 20 tablet 0   pantoprazole  (PROTONIX ) 20 MG tablet Take 20 mg by mouth 2 (two) times daily before a meal.     predniSONE  (DELTASONE ) 10 MG tablet Take by mouth. (Patient not taking: Reported on 01/05/2024)     tamsulosin  (FLOMAX ) 0.4 MG CAPS capsule Take 1 capsule (0.4 mg total) by mouth daily. 30 capsule 3   No current facility-administered medications on file prior to visit.    Allergies:  Allergies  Allergen Reactions   Valium [Diazepam] Other (See Comments)    convulsions   Nortriptyline Other (See Comments)    Unable to focus, jittery   Sulfamethoxazole-Trimethoprim Other (See Comments)    States blood pressure elevated.   Morphine  Palpitations and Other (See Comments)    Heart stopped and then went very very fast, and stayed dizzy for 2-3 hrs     Family History: Family History  Problem Relation Age of Onset   Hypertension Mother    Breast cancer Mother    CAD Father    Heart attack Father    Bladder Cancer Brother     Social History:  reports that he has never smoked. He has never been exposed to tobacco smoke. He has never used smokeless tobacco. He reports that he does not drink alcohol and does not use drugs.  ROS: Pertinent ROS in HPI  Physical Exam: There were no vitals taken for this visit.  Constitutional:  Well nourished. Alert and oriented, No acute distress. HEENT: [place] AT, mask in place.   Trachea midline. Cardiovascular: No clubbing, cyanosis, or edema. Respiratory: Normal respiratory effort, no increased work of  breathing. Neurologic: Grossly intact, no focal deficits, moving all 4 extremities. Psychiatric: Normal mood and affect.  Laboratory Data: See Epic and HPI   I have reviewed the labs.   Pertinent Imaging: KUB, radiologist interpretation still pending I have independently reviewed the films.    Assessment & Plan:  ***  1. Left renal stone - UA *** - KUB ***  No follow-ups on file.  These notes generated with voice recognition software. I apologize for typographical errors.  CLOTILDA HELON RIGGERS  [facility] [address]  [address] [place], [place] [address] [phone]

## 2024-01-24 ENCOUNTER — Ambulatory Visit: Admitting: Urology

## 2024-01-31 NOTE — Progress Notes (Deleted)
 02/02/2024 1:34 PM   Manuel Cain [birth year] [id number]  Referring provider: [name], MD [phone] [facility] [address] [facility] [place],  [place] [address]  No chief complaint on file.   HPI: Manuel Cain is a 53 y.o. who is status post ESWL who presents today for follow up.  Underwent ESWL on 01/05/2024 for 8 mm left UPJ stone with Dr. Francisca.  Their postprocedural course was as expected and uneventful.   They have/have not passed fragments. ***   They bring in fragments for analysis. ***  KUB ***  UA ***  PMH: Past Medical History:  Diagnosis Date   Anxiety    Blood in stool    dark pebble like per pt  x4 wks   Chest pain    on going per pt-x 63mo   Chronic ankle pain    Right, s/p FCR(8006)   Chronic hip pain    right, s/p FCR(8006)   Curvature of spine    Dyspnea    Esophagitis    Frequent nosebleeds    x 2 mo per pt (march 2018)   GERD (gastroesophageal reflux disease)    Head injury 04/2016       Hemorrhoids    Hypertension    Kidney stone    Memory changes    x 1 yr-since 2017   Migraine    since 2013   Vasovagal syncope     Surgical History: Past Surgical History:  Procedure Laterality Date   BIOPSY  03/29/2023   Procedure: BIOPSY;  Surgeon: Toledo, Ladell POUR, MD;  Location: [facility];  Service: Gastroenterology;;   CARDIAC CATHETERIZATION     CHOLECYSTECTOMY N/A 08/23/2018   Procedure: LAPAROSCOPIC CHOLECYSTECTOMY;  Surgeon: Rodolph Romano, MD;  Location: [facility];  Service: General;  Laterality: N/A;   COLONOSCOPY N/A 03/29/2023   Procedure: COLONOSCOPY;  Surgeon: Toledo, Ladell POUR, MD;  Location: [facility];  Service: Gastroenterology;  Laterality: N/A;   COLONOSCOPY WITH PROPOFOL  N/A 12/16/2016   Procedure: COLONOSCOPY WITH PROPOFOL ;  Surgeon: Jinny Carmine, MD;  Location: [facility];  Service: Endoscopy;  Laterality: N/A;   ESOPHAGOGASTRODUODENOSCOPY N/A 03/29/2023   Procedure:  ESOPHAGOGASTRODUODENOSCOPY (EGD);  Surgeon: Toledo, Ladell POUR, MD;  Location: [facility];  Service: Gastroenterology;  Laterality: N/A;   ESOPHAGOGASTRODUODENOSCOPY (EGD) WITH PROPOFOL  N/A 12/16/2016   Procedure: ESOPHAGOGASTRODUODENOSCOPY (EGD) WITH PROPOFOL ;  Surgeon: Jinny Carmine, MD;  Location: [facility];  Service: Endoscopy;  Laterality: N/A;   ESOPHAGOGASTRODUODENOSCOPY (EGD) WITH PROPOFOL  N/A 08/09/2018   Procedure: ESOPHAGOGASTRODUODENOSCOPY (EGD) WITH PROPOFOL ;  Surgeon: Toledo, Ladell POUR, MD;  Location: [facility];  Service: Gastroenterology;  Laterality: N/A;   EXTRACORPOREAL SHOCK WAVE LITHOTRIPSY Right 07/09/2021   Procedure: EXTRACORPOREAL SHOCK WAVE LITHOTRIPSY (ESWL);  Surgeon: Twylla Glendia BROCKS, MD;  Location: [facility];  Service: Urology;  Laterality: Right;   EXTRACORPOREAL SHOCK WAVE LITHOTRIPSY Left 01/05/2024   Procedure: LITHOTRIPSY, ESWL;  Surgeon: Francisca Redell BROCKS, MD;  Location: [facility];  Service: Urology;  Laterality: Left;   KNEE SURGERY Left    POLYPECTOMY  12/16/2016   Procedure: POLYPECTOMY;  Surgeon: Jinny Carmine, MD;  Location: [facility];  Service: Endoscopy;;   POLYPECTOMY  03/29/2023   Procedure: POLYPECTOMY;  Surgeon: Toledo, Ladell POUR, MD;  Location: [facility];  Service: Gastroenterology;;   vastectomy  1999    Home Medications:  Current Outpatient Medications on File Prior to Visit  Medication Sig Dispense Refill   acetaminophen  (TYLENOL ) 325 MG tablet Take 650 mg by mouth every 6 (  six) hours as needed.     acetaminophen  (TYLENOL ) 500 MG tablet Take 500 mg by mouth every 6 (six) hours as needed for moderate pain (pain score 4-6). Packets not tablets     amitriptyline (ELAVIL) 25 MG tablet Take 25 mg by mouth at bedtime. (Patient not taking: Reported on 01/05/2024)     amLODipine (NORVASC) 10 MG tablet Take 10 mg by mouth daily.     EOHILIA  2 MG/10ML SUSP  (Patient not taking: Reported on 01/05/2024)     ibuprofen  (ADVIL ) 800  MG tablet Take 800 mg by mouth.     ketorolac  (TORADOL ) 10 MG tablet Take 1 tablet (10 mg total) by mouth every 6 (six) hours as needed for severe pain (pain score 7-10). 10 tablet 0   meclizine (ANTIVERT) 25 MG tablet Take by mouth.     Multiple Vitamins-Minerals (CENTRUM SILVER 50+MEN PO) Take 1 tablet by mouth daily.      ondansetron  (ZOFRAN ) 4 MG tablet Take 1 tablet (4 mg total) by mouth every 8 (eight) hours as needed for nausea or vomiting. 20 tablet 0   pantoprazole  (PROTONIX ) 20 MG tablet Take 20 mg by mouth 2 (two) times daily before a meal.     predniSONE  (DELTASONE ) 10 MG tablet Take by mouth. (Patient not taking: Reported on 01/05/2024)     tamsulosin  (FLOMAX ) 0.4 MG CAPS capsule Take 1 capsule (0.4 mg total) by mouth daily. 30 capsule 3   No current facility-administered medications on file prior to visit.    Allergies:  Allergies  Allergen Reactions   Valium [Diazepam] Other (See Comments)    convulsions   Nortriptyline Other (See Comments)    Unable to focus, jittery   Sulfamethoxazole-Trimethoprim Other (See Comments)    States blood pressure elevated.   Morphine  Palpitations and Other (See Comments)    Heart stopped and then went very very fast, and stayed dizzy for 2-3 hrs     Family History: Family History  Problem Relation Age of Onset   Hypertension Mother    Breast cancer Mother    CAD Father    Heart attack Father    Bladder Cancer Brother     Social History:  reports that he has never smoked. He has never been exposed to tobacco smoke. He has never used smokeless tobacco. He reports that he does not drink alcohol and does not use drugs.  ROS: Pertinent ROS in HPI  Physical Exam: There were no vitals taken for this visit.  Constitutional:  Well nourished. Alert and oriented, No acute distress. HEENT: [place] AT, mask in place.   Trachea midline. Cardiovascular: No clubbing, cyanosis, or edema. Respiratory: Normal respiratory effort, no increased work of  breathing. Neurologic: Grossly intact, no focal deficits, moving all 4 extremities. Psychiatric: Normal mood and affect.  Laboratory Data: See Epic and HPI   I have reviewed the labs.   Pertinent Imaging: KUB, radiologist interpretation still pending I have independently reviewed the films.    Assessment & Plan:  ***  1. Left renal stone - UA *** - KUB ***  No follow-ups on file.  These notes generated with voice recognition software. I apologize for typographical errors.  Manuel Cain  [facility] [address]  [address] [place], [place] [address] [phone]

## 2024-02-02 ENCOUNTER — Ambulatory Visit: Admitting: Urology

## 2024-02-04 NOTE — Progress Notes (Unsigned)
 02/06/2024 8:22 AM   Manuel Cain Dec 03, 1970 [id number]  Referring provider: [name], MD [phone] [facility] [address] [facility] [place],  [place] [address]  Chief Complaint  Patient presents with   Nephrolithiasis    HPI: Manuel Cain is a 53 y.o. who is status post ESWL who presents today for follow up.  The patient presents for follow-up after undergoing extracorporeal shock wave lithotripsy (ESWL) for a previously identified left renal calculus.  He states he had 5 days of intense left-sided flank pain after the procedure.  There was also a large red burn in the area where the ESWL was performed.  He stated he woke up 2 times during the procedure.  He had gross hematuria with his first 2 voids after the procedure and that his urine has been cleared since.  He continues to have some nausea.  He denies passing any visible stone fragments since the procedure but notes improvement in prior discomfort.   KUB (01/11/2024) left renal calculus no longer visible and KUB (02/06/2024) two 4 mm calcifications are seen in the vicinity of the lower ureter  UA (01/03/2024) yellow clear, pH 6.0, specific gravity, 0-5 WBC's, 0-2 RBC's and 0-10 epithelial cells and UA (02/06/2024) yellow clear, specific gravity 1.020, pH 6.0, 0-5 WBCs, 0-2 RBCs, and 0-10 epithelial cells.  PMH: Past Medical History:  Diagnosis Date   Anxiety    Blood in stool    dark pebble like per pt  x4 wks   Chest pain    on going per pt-x 5mo   Chronic ankle pain    Right, s/p FCR(8006)   Chronic hip pain    right, s/p FCR(8006)   Curvature of spine    Dyspnea    Esophagitis    Frequent nosebleeds    x 2 mo per pt (march 2018)   GERD (gastroesophageal reflux disease)    Head injury 04/2016       Hemorrhoids    Hypertension    Kidney stone    Memory changes    x 1 yr-since 2017   Migraine    since 2013   Vasovagal syncope     Surgical History: Past Surgical History:  Procedure  Laterality Date   BIOPSY  03/29/2023   Procedure: BIOPSY;  Surgeon: Toledo, Ladell POUR, MD;  Location: [facility];  Service: Gastroenterology;;   CARDIAC CATHETERIZATION     CHOLECYSTECTOMY N/A 08/23/2018   Procedure: LAPAROSCOPIC CHOLECYSTECTOMY;  Surgeon: Rodolph Romano, MD;  Location: [facility];  Service: General;  Laterality: N/A;   COLONOSCOPY N/A 03/29/2023   Procedure: COLONOSCOPY;  Surgeon: Toledo, Ladell POUR, MD;  Location: [facility];  Service: Gastroenterology;  Laterality: N/A;   COLONOSCOPY WITH PROPOFOL  N/A 12/16/2016   Procedure: COLONOSCOPY WITH PROPOFOL ;  Surgeon: Jinny Carmine, MD;  Location: [facility];  Service: Endoscopy;  Laterality: N/A;   ESOPHAGOGASTRODUODENOSCOPY N/A 03/29/2023   Procedure: ESOPHAGOGASTRODUODENOSCOPY (EGD);  Surgeon: Toledo, Ladell POUR, MD;  Location: [facility];  Service: Gastroenterology;  Laterality: N/A;   ESOPHAGOGASTRODUODENOSCOPY (EGD) WITH PROPOFOL  N/A 12/16/2016   Procedure: ESOPHAGOGASTRODUODENOSCOPY (EGD) WITH PROPOFOL ;  Surgeon: Jinny Carmine, MD;  Location: [facility];  Service: Endoscopy;  Laterality: N/A;   ESOPHAGOGASTRODUODENOSCOPY (EGD) WITH PROPOFOL  N/A 08/09/2018   Procedure: ESOPHAGOGASTRODUODENOSCOPY (EGD) WITH PROPOFOL ;  Surgeon: Toledo, Ladell POUR, MD;  Location: [facility];  Service: Gastroenterology;  Laterality: N/A;   EXTRACORPOREAL SHOCK WAVE LITHOTRIPSY Right 07/09/2021   Procedure: EXTRACORPOREAL SHOCK WAVE LITHOTRIPSY (ESWL);  Surgeon: Twylla Glendia BROCKS, MD;  Location: [facility];  Service: Urology;  Laterality: Right;   EXTRACORPOREAL SHOCK WAVE LITHOTRIPSY Left 01/05/2024   Procedure: LITHOTRIPSY, ESWL;  Surgeon: Francisca Redell BROCKS, MD;  Location: [facility];  Service: Urology;  Laterality: Left;   KNEE SURGERY Left    POLYPECTOMY  12/16/2016   Procedure: POLYPECTOMY;  Surgeon: Jinny Carmine, MD;  Location: [facility];  Service: Endoscopy;;   POLYPECTOMY  03/29/2023   Procedure:  POLYPECTOMY;  Surgeon: Toledo, Ladell POUR, MD;  Location: [facility];  Service: Gastroenterology;;   vastectomy  1999    Home Medications:  Current Outpatient Medications on File Prior to Visit  Medication Sig Dispense Refill   acetaminophen  (TYLENOL ) 325 MG tablet Take 650 mg by mouth every 6 (six) hours as needed.     acetaminophen  (TYLENOL ) 500 MG tablet Take 500 mg by mouth every 6 (six) hours as needed for moderate pain (pain score 4-6). Packets not tablets     amLODipine (NORVASC) 10 MG tablet Take 10 mg by mouth daily.     ibuprofen  (ADVIL ) 800 MG tablet Take 800 mg by mouth.     ketorolac  (TORADOL ) 10 MG tablet Take 1 tablet (10 mg total) by mouth every 6 (six) hours as needed for severe pain (pain score 7-10). 10 tablet 0   meclizine (ANTIVERT) 25 MG tablet Take by mouth.     Multiple Vitamins-Minerals (CENTRUM SILVER 50+MEN PO) Take 1 tablet by mouth daily.      ondansetron  (ZOFRAN ) 4 MG tablet Take 1 tablet (4 mg total) by mouth every 8 (eight) hours as needed for nausea or vomiting. 20 tablet 0   pantoprazole  (PROTONIX ) 20 MG tablet Take 20 mg by mouth 2 (two) times daily before a meal.     tamsulosin  (FLOMAX ) 0.4 MG CAPS capsule Take 1 capsule (0.4 mg total) by mouth daily. 30 capsule 3   amitriptyline (ELAVIL) 25 MG tablet Take 25 mg by mouth at bedtime. (Patient not taking: Reported on 02/06/2024)     EOHILIA  2 MG/10ML SUSP  (Patient not taking: Reported on 02/06/2024)     predniSONE  (DELTASONE ) 10 MG tablet Take by mouth. (Patient not taking: Reported on 02/06/2024)     No current facility-administered medications on file prior to visit.    Allergies:  Allergies  Allergen Reactions   Valium [Diazepam] Other (See Comments)    convulsions   Nortriptyline Other (See Comments)    Unable to focus, jittery   Sulfamethoxazole-Trimethoprim Other (See Comments)    States blood pressure elevated.   Morphine  Palpitations and Other (See Comments)    Heart stopped and then went  very very fast, and stayed dizzy for 2-3 hrs     Family History: Family History  Problem Relation Age of Onset   Hypertension Mother    Breast cancer Mother    CAD Father    Heart attack Father    Bladder Cancer Brother     Social History:  reports that he has never smoked. He has never been exposed to tobacco smoke. He has never used smokeless tobacco. He reports that he does not drink alcohol and does not use drugs.  ROS: Pertinent ROS in HPI  Physical Exam: BP (!) 149/52 (BP Location: Left Arm, Patient Position: Sitting, Cuff Size: Normal)   Pulse 69   Wt 290 lb (131.5 kg)   SpO2 95%   BMI 42.83 kg/m   Constitutional:  Well nourished. Alert and oriented, No acute distress. HEENT: [place] AT, mask in place.  Trachea midline. Cardiovascular: No clubbing, cyanosis, or edema. Respiratory: Normal respiratory effort, no increased work of breathing. Neurologic: Grossly intact, no focal deficits, moving all 4 extremities. Psychiatric: Normal mood and affect.  Laboratory Data: See Epic and HPI   I have reviewed the labs.   Pertinent Imaging: EXAM: 1 VIEW XRAY OF THE ABDOMEN 01/11/2024 11:48:00 AM   COMPARISON: 01/05/2024   CLINICAL HISTORY: flank pain   FINDINGS:   BOWEL: Nonobstructive bowel gas pattern.   SOFT TISSUES: Surgical clips in right upper quadrant noted. Previously seen left renal calculus is no longer identified. No ureteral stones are seen. Multiple phleboliths are noted within the pelvis.   BONES: Lumbar spinal fusion hardware noted. No acute osseous abnormality.   IMPRESSION: 1. No acute findings. 2. Resolution of previously seen left renal calculus with no ureteral stones identified.   Electronically signed by: Oneil Devonshire MD 01/13/2024 03:59 AM EST RP Workstation: [id number] I have independently reviewed the films.  See HPI.    Assessment & Plan:    1. Left renal stone - status post ESWL with complete radiographic resolution.  Clinically asymptomatic.  - there are some calcifications located in the vicinity of the left distal ureter   - Encourage ongoing hydration (>=2-2.5 L/day) to reduce recurrence risk. - Will have him return in 1 month for repeat KUB and UA  - Return to clinic if symptoms recur (flank pain, hematuria, fever). - Follow-up PRN or in 6-12 months with repeat imaging if clinically indicated.  No follow-ups on file.  These notes generated with voice recognition software. I apologize for typographical errors.  CLOTILDA HELON RIGGERS  [facility] [address]  [address] [place], [place] [address] [phone]

## 2024-02-06 ENCOUNTER — Ambulatory Visit: Admission: RE | Admit: 2024-02-06 | Discharge: 2024-02-06 | Disposition: A | Attending: Urology | Admitting: Urology

## 2024-02-06 ENCOUNTER — Encounter: Payer: Self-pay | Admitting: Urology

## 2024-02-06 ENCOUNTER — Ambulatory Visit: Admitting: Urology

## 2024-02-06 ENCOUNTER — Ambulatory Visit
Admission: RE | Admit: 2024-02-06 | Discharge: 2024-02-06 | Disposition: A | Source: Ambulatory Visit | Attending: Urology | Admitting: Urology

## 2024-02-06 VITALS — BP 149/52 | HR 69 | Wt 290.0 lb

## 2024-02-06 DIAGNOSIS — N201 Calculus of ureter: Secondary | ICD-10-CM

## 2024-02-06 DIAGNOSIS — N200 Calculus of kidney: Secondary | ICD-10-CM

## 2024-02-07 LAB — URINALYSIS, COMPLETE
Bilirubin, UA: NEGATIVE
Glucose, UA: NEGATIVE
Ketones, UA: NEGATIVE
Leukocytes,UA: NEGATIVE
Nitrite, UA: NEGATIVE
Protein,UA: NEGATIVE
RBC, UA: NEGATIVE
Specific Gravity, UA: 1.02 (ref 1.005–1.030)
Urobilinogen, Ur: 0.2 mg/dL (ref 0.2–1.0)
pH, UA: 6 (ref 5.0–7.5)

## 2024-02-07 LAB — MICROSCOPIC EXAMINATION: Bacteria, UA: NONE SEEN

## 2024-03-06 NOTE — Progress Notes (Unsigned)
 03/08/2024 10:14 PM   [name] 07/23/1970 [id number]  Referring provider: [name], MD [phone] [facility] [address] [facility] [place],  [place] [address]  No chief complaint on file.   HPI: Manuel Cain is a 54 y.o.  who presents today for one month follow up.  Previous records reviewed.     Patient underwent ESWL for a left renal calculus.  On follow up, he had 2 calcifications seen in the vicinity of the lower left ureter.  He presents today for repeat KUB and recheck on UA.   KUB ***  UA ***  PMH: Past Medical History:  Diagnosis Date   Anxiety    Blood in stool    dark pebble like per pt  x4 wks   Chest pain    on going per pt-x 33mo   Chronic ankle pain    Right, s/p FCR(8006)   Chronic hip pain    right, s/p FCR(8006)   Curvature of spine    Dyspnea    Esophagitis    Frequent nosebleeds    x 2 mo per pt (march 2018)   GERD (gastroesophageal reflux disease)    Head injury 04/2016       Hemorrhoids    Hypertension    Kidney stone    Memory changes    x 1 yr-since 2017   Migraine    since 2013   Vasovagal syncope     Surgical History: Past Surgical History:  Procedure Laterality Date   BIOPSY  03/29/2023   Procedure: BIOPSY;  Surgeon: Toledo, Ladell POUR, MD;  Location: [facility];  Service: Gastroenterology;;   CARDIAC CATHETERIZATION     CHOLECYSTECTOMY N/A 08/23/2018   Procedure: LAPAROSCOPIC CHOLECYSTECTOMY;  Surgeon: Rodolph Romano, MD;  Location: [facility];  Service: General;  Laterality: N/A;   COLONOSCOPY N/A 03/29/2023   Procedure: COLONOSCOPY;  Surgeon: Toledo, Ladell POUR, MD;  Location: [facility];  Service: Gastroenterology;  Laterality: N/A;   COLONOSCOPY WITH PROPOFOL  N/A 12/16/2016   Procedure: COLONOSCOPY WITH PROPOFOL ;  Surgeon: Jinny Carmine, MD;  Location: [facility];  Service: Endoscopy;  Laterality: N/A;   ESOPHAGOGASTRODUODENOSCOPY N/A 03/29/2023   Procedure: ESOPHAGOGASTRODUODENOSCOPY  (EGD);  Surgeon: Toledo, Ladell POUR, MD;  Location: [facility];  Service: Gastroenterology;  Laterality: N/A;   ESOPHAGOGASTRODUODENOSCOPY (EGD) WITH PROPOFOL  N/A 12/16/2016   Procedure: ESOPHAGOGASTRODUODENOSCOPY (EGD) WITH PROPOFOL ;  Surgeon: Jinny Carmine, MD;  Location: [facility];  Service: Endoscopy;  Laterality: N/A;   ESOPHAGOGASTRODUODENOSCOPY (EGD) WITH PROPOFOL  N/A 08/09/2018   Procedure: ESOPHAGOGASTRODUODENOSCOPY (EGD) WITH PROPOFOL ;  Surgeon: Toledo, Ladell POUR, MD;  Location: [facility];  Service: Gastroenterology;  Laterality: N/A;   EXTRACORPOREAL SHOCK WAVE LITHOTRIPSY Right 07/09/2021   Procedure: EXTRACORPOREAL SHOCK WAVE LITHOTRIPSY (ESWL);  Surgeon: Twylla Glendia BROCKS, MD;  Location: [facility];  Service: Urology;  Laterality: Right;   EXTRACORPOREAL SHOCK WAVE LITHOTRIPSY Left 01/05/2024   Procedure: LITHOTRIPSY, ESWL;  Surgeon: Francisca Redell BROCKS, MD;  Location: [facility];  Service: Urology;  Laterality: Left;   KNEE SURGERY Left    POLYPECTOMY  12/16/2016   Procedure: POLYPECTOMY;  Surgeon: Jinny Carmine, MD;  Location: [facility];  Service: Endoscopy;;   POLYPECTOMY  03/29/2023   Procedure: POLYPECTOMY;  Surgeon: Toledo, Ladell POUR, MD;  Location: [facility];  Service: Gastroenterology;;   vastectomy  1999    Home Medications:  Current Outpatient Medications on File Prior to Visit  Medication Sig Dispense Refill   acetaminophen  (TYLENOL ) 325 MG tablet Take 650 mg by  mouth every 6 (six) hours as needed.     acetaminophen  (TYLENOL ) 500 MG tablet Take 500 mg by mouth every 6 (six) hours as needed for moderate pain (pain score 4-6). Packets not tablets     amitriptyline (ELAVIL) 25 MG tablet Take 25 mg by mouth at bedtime. (Patient not taking: Reported on 02/06/2024)     amLODipine (NORVASC) 10 MG tablet Take 10 mg by mouth daily.     EOHILIA  2 MG/10ML SUSP  (Patient not taking: Reported on 02/06/2024)     ibuprofen  (ADVIL ) 800 MG tablet Take 800 mg by  mouth.     ketorolac  (TORADOL ) 10 MG tablet Take 1 tablet (10 mg total) by mouth every 6 (six) hours as needed for severe pain (pain score 7-10). 10 tablet 0   meclizine (ANTIVERT) 25 MG tablet Take by mouth.     Multiple Vitamins-Minerals (CENTRUM SILVER 50+MEN PO) Take 1 tablet by mouth daily.      ondansetron  (ZOFRAN ) 4 MG tablet Take 1 tablet (4 mg total) by mouth every 8 (eight) hours as needed for nausea or vomiting. 20 tablet 0   pantoprazole  (PROTONIX ) 20 MG tablet Take 20 mg by mouth 2 (two) times daily before a meal.     predniSONE  (DELTASONE ) 10 MG tablet Take by mouth. (Patient not taking: Reported on 02/06/2024)     tamsulosin  (FLOMAX ) 0.4 MG CAPS capsule Take 1 capsule (0.4 mg total) by mouth daily. 30 capsule 3   No current facility-administered medications on file prior to visit.    Allergies:  Allergies  Allergen Reactions   Valium [Diazepam] Other (See Comments)    convulsions   Nortriptyline Other (See Comments)    Unable to focus, jittery   Sulfamethoxazole-Trimethoprim Other (See Comments)    States blood pressure elevated.   Morphine  Palpitations and Other (See Comments)    Heart stopped and then went very very fast, and stayed dizzy for 2-3 hrs     Family History: Family History  Problem Relation Age of Onset   Hypertension Mother    Breast cancer Mother    CAD Father    Heart attack Father    Bladder Cancer Brother     Social History:  reports that he has never smoked. He has never been exposed to tobacco smoke. He has never used smokeless tobacco. He reports that he does not drink alcohol and does not use drugs.  ROS: Pertinent ROS in HPI  Physical Exam: There were no vitals taken for this visit.  Constitutional:  Well nourished. Alert and oriented, No acute distress. HEENT: [place] AT, moist mucus membranes.  Trachea midline, no masses. Cardiovascular: No clubbing, cyanosis, or edema. Respiratory: Normal respiratory effort, no increased work of  breathing. GI: Abdomen is soft, non tender, non distended, no abdominal masses. Liver and spleen not palpable.  No hernias appreciated.  Stool sample for occult testing is not indicated.   GU: No CVA tenderness.  No bladder fullness or masses.  Patient with circumcised/uncircumcised phallus. ***Foreskin easily retracted***  Urethral meatus is patent.  No penile discharge. No penile lesions or rashes. Scrotum without lesions, cysts, rashes and/or edema.  Testicles are located scrotally bilaterally. No masses are appreciated in the testicles. Left and right epididymis are normal. Rectal: Patient with  normal sphincter tone. Anus and perineum without scarring or rashes. No rectal masses are appreciated. Prostate is approximately *** grams, *** nodules are appreciated. Seminal vesicles are normal. Skin: No rashes, bruises or suspicious lesions. Lymph: No cervical  or inguinal adenopathy. Neurologic: Grossly intact, no focal deficits, moving all 4 extremities. Psychiatric: Normal mood and affect.   Laboratory Data: See Epic and HPI   I have reviewed the labs.   Pertinent Imaging: KUB, radiologist interpretation still pending I have independently reviewed the films.  See HPI.    Assessment & Plan:    1. Left renal stone - UA *** - KUB ***  No follow-ups on file.  These notes generated with voice recognition software. I apologize for typographical errors.  CLOTILDA HELON RIGGERS  [facility] [address]  [address] [place], [place] [address] [phone]

## 2024-03-08 ENCOUNTER — Ambulatory Visit: Admitting: Urology

## 2024-03-08 DIAGNOSIS — N201 Calculus of ureter: Secondary | ICD-10-CM
